# Patient Record
Sex: Female | Born: 1935 | Race: White | Hispanic: No | Marital: Married | Smoking: Former smoker
Health system: Southern US, Community
[De-identification: ages and names within clinical notes are randomized; demographics above are authoritative.]

## PROBLEM LIST (undated history)

## (undated) DIAGNOSIS — F329 Major depressive disorder, single episode, unspecified: Secondary | ICD-10-CM

## (undated) DIAGNOSIS — I4891 Unspecified atrial fibrillation: Secondary | ICD-10-CM

## (undated) DIAGNOSIS — I442 Atrioventricular block, complete: Secondary | ICD-10-CM

## (undated) DIAGNOSIS — Z87440 Personal history of urinary (tract) infections: Secondary | ICD-10-CM

## (undated) DIAGNOSIS — I10 Essential (primary) hypertension: Secondary | ICD-10-CM

## (undated) DIAGNOSIS — F32A Depression, unspecified: Secondary | ICD-10-CM

## (undated) DIAGNOSIS — Z950 Presence of cardiac pacemaker: Secondary | ICD-10-CM

## (undated) DIAGNOSIS — G629 Polyneuropathy, unspecified: Secondary | ICD-10-CM

## (undated) DIAGNOSIS — G479 Sleep disorder, unspecified: Secondary | ICD-10-CM

## (undated) DIAGNOSIS — J40 Bronchitis, not specified as acute or chronic: Secondary | ICD-10-CM

## (undated) DIAGNOSIS — M47816 Spondylosis without myelopathy or radiculopathy, lumbar region: Secondary | ICD-10-CM

## (undated) HISTORY — DX: Atrioventricular block, complete: I44.2

## (undated) HISTORY — PX: APPENDECTOMY: SHX54

## (undated) HISTORY — DX: Polyneuropathy, unspecified: G62.9

## (undated) HISTORY — DX: Bronchitis, not specified as acute or chronic: J40

## (undated) HISTORY — DX: Spondylosis without myelopathy or radiculopathy, lumbar region: M47.816

## (undated) HISTORY — DX: Depression, unspecified: F32.A

## (undated) HISTORY — PX: PULSE GENERATOR IMPLANT: SHX5370

## (undated) HISTORY — DX: Essential (primary) hypertension: I10

## (undated) HISTORY — PX: VENTRICULAR CARDIAC PACEMAKER INSERTION: SHX836

## (undated) HISTORY — PX: BREAST BIOPSY: SHX20

## (undated) HISTORY — PX: VENTRICULAR ABLATION SURGERY: SHX835

## (undated) HISTORY — PX: KNEE SURGERY: SHX244

## (undated) HISTORY — DX: Sleep disorder, unspecified: G47.9

## (undated) HISTORY — DX: Personal history of urinary (tract) infections: Z87.440

## (undated) HISTORY — DX: Unspecified atrial fibrillation: I48.91

## (undated) HISTORY — PX: INSERT / REPLACE / REMOVE PACEMAKER: SUR710

## (undated) HISTORY — DX: Major depressive disorder, single episode, unspecified: F32.9

## (undated) HISTORY — DX: Presence of cardiac pacemaker: Z95.0

---

## 1997-11-03 ENCOUNTER — Ambulatory Visit (HOSPITAL_COMMUNITY): Admission: RE | Admit: 1997-11-03 | Discharge: 1997-11-03 | Payer: Self-pay | Admitting: Obstetrics & Gynecology

## 2003-09-29 ENCOUNTER — Inpatient Hospital Stay (HOSPITAL_COMMUNITY): Admission: EM | Admit: 2003-09-29 | Discharge: 2003-10-04 | Payer: Self-pay | Admitting: Emergency Medicine

## 2003-10-02 ENCOUNTER — Encounter: Payer: Self-pay | Admitting: Cardiology

## 2004-02-02 ENCOUNTER — Inpatient Hospital Stay (HOSPITAL_COMMUNITY): Admission: EM | Admit: 2004-02-02 | Discharge: 2004-02-10 | Payer: Self-pay | Admitting: Emergency Medicine

## 2004-07-19 ENCOUNTER — Ambulatory Visit: Payer: Self-pay | Admitting: Cardiology

## 2004-07-26 ENCOUNTER — Ambulatory Visit: Payer: Self-pay | Admitting: Family Medicine

## 2004-08-02 ENCOUNTER — Ambulatory Visit: Payer: Self-pay

## 2004-08-13 ENCOUNTER — Ambulatory Visit: Payer: Self-pay | Admitting: Cardiology

## 2004-08-29 ENCOUNTER — Ambulatory Visit: Payer: Self-pay

## 2004-08-30 ENCOUNTER — Ambulatory Visit: Payer: Self-pay | Admitting: Cardiology

## 2004-09-20 ENCOUNTER — Ambulatory Visit: Payer: Self-pay | Admitting: Cardiovascular Disease

## 2004-09-28 ENCOUNTER — Ambulatory Visit: Payer: Self-pay | Admitting: Cardiology

## 2004-10-14 ENCOUNTER — Ambulatory Visit: Payer: Self-pay | Admitting: *Deleted

## 2004-11-08 ENCOUNTER — Ambulatory Visit: Payer: Self-pay | Admitting: Cardiology

## 2004-11-22 ENCOUNTER — Ambulatory Visit: Payer: Self-pay | Admitting: Cardiology

## 2004-12-06 ENCOUNTER — Ambulatory Visit: Payer: Self-pay | Admitting: Cardiology

## 2004-12-19 ENCOUNTER — Ambulatory Visit: Payer: Self-pay | Admitting: Cardiology

## 2004-12-31 ENCOUNTER — Ambulatory Visit: Payer: Self-pay | Admitting: *Deleted

## 2005-01-16 ENCOUNTER — Ambulatory Visit: Payer: Self-pay | Admitting: Internal Medicine

## 2005-01-28 ENCOUNTER — Encounter: Admission: RE | Admit: 2005-01-28 | Discharge: 2005-01-28 | Payer: Self-pay | Admitting: Orthopedic Surgery

## 2005-01-29 ENCOUNTER — Ambulatory Visit: Payer: Self-pay | Admitting: Cardiology

## 2005-01-30 ENCOUNTER — Ambulatory Visit: Payer: Self-pay | Admitting: Cardiology

## 2005-02-27 ENCOUNTER — Ambulatory Visit: Payer: Self-pay | Admitting: Cardiology

## 2005-03-19 ENCOUNTER — Ambulatory Visit: Payer: Self-pay | Admitting: Cardiology

## 2005-04-01 ENCOUNTER — Ambulatory Visit: Payer: Self-pay | Admitting: Cardiology

## 2005-04-04 ENCOUNTER — Ambulatory Visit: Payer: Self-pay

## 2005-04-11 ENCOUNTER — Ambulatory Visit: Payer: Self-pay

## 2005-04-15 ENCOUNTER — Inpatient Hospital Stay (HOSPITAL_COMMUNITY): Admission: RE | Admit: 2005-04-15 | Discharge: 2005-04-16 | Payer: Self-pay | Admitting: Cardiology

## 2005-04-16 ENCOUNTER — Ambulatory Visit: Payer: Self-pay | Admitting: Cardiology

## 2005-04-25 ENCOUNTER — Ambulatory Visit: Payer: Self-pay | Admitting: Internal Medicine

## 2005-04-30 ENCOUNTER — Ambulatory Visit: Payer: Self-pay | Admitting: Cardiology

## 2005-04-30 ENCOUNTER — Ambulatory Visit: Payer: Self-pay

## 2005-05-08 ENCOUNTER — Ambulatory Visit: Payer: Self-pay | Admitting: Cardiology

## 2005-05-23 ENCOUNTER — Ambulatory Visit: Payer: Self-pay | Admitting: Cardiology

## 2005-06-23 ENCOUNTER — Ambulatory Visit: Payer: Self-pay | Admitting: Internal Medicine

## 2005-07-03 ENCOUNTER — Ambulatory Visit: Payer: Self-pay | Admitting: Cardiology

## 2005-07-17 ENCOUNTER — Ambulatory Visit: Payer: Self-pay | Admitting: Cardiology

## 2005-08-13 ENCOUNTER — Ambulatory Visit: Payer: Self-pay | Admitting: *Deleted

## 2005-08-20 ENCOUNTER — Ambulatory Visit: Payer: Self-pay | Admitting: Cardiology

## 2005-08-29 ENCOUNTER — Ambulatory Visit: Payer: Self-pay | Admitting: Internal Medicine

## 2005-08-29 ENCOUNTER — Encounter: Admission: RE | Admit: 2005-08-29 | Discharge: 2005-08-29 | Payer: Self-pay | Admitting: Orthopedic Surgery

## 2005-09-05 ENCOUNTER — Encounter: Admission: RE | Admit: 2005-09-05 | Discharge: 2005-09-05 | Payer: Self-pay | Admitting: Orthopaedic Surgery

## 2005-09-10 ENCOUNTER — Ambulatory Visit (HOSPITAL_COMMUNITY): Admission: RE | Admit: 2005-09-10 | Discharge: 2005-09-11 | Payer: Self-pay | Admitting: Orthopaedic Surgery

## 2005-09-10 ENCOUNTER — Encounter (INDEPENDENT_AMBULATORY_CARE_PROVIDER_SITE_OTHER): Payer: Self-pay | Admitting: Specialist

## 2005-09-19 ENCOUNTER — Ambulatory Visit: Payer: Self-pay | Admitting: Internal Medicine

## 2005-10-03 ENCOUNTER — Ambulatory Visit: Payer: Self-pay | Admitting: Cardiology

## 2005-10-16 ENCOUNTER — Ambulatory Visit: Payer: Self-pay | Admitting: *Deleted

## 2005-10-31 ENCOUNTER — Ambulatory Visit: Payer: Self-pay | Admitting: Rheumatology

## 2005-11-10 ENCOUNTER — Inpatient Hospital Stay (HOSPITAL_COMMUNITY): Admission: AD | Admit: 2005-11-10 | Discharge: 2005-11-12 | Payer: Self-pay | Admitting: Orthopaedic Surgery

## 2005-11-11 ENCOUNTER — Encounter (INDEPENDENT_AMBULATORY_CARE_PROVIDER_SITE_OTHER): Payer: Self-pay | Admitting: *Deleted

## 2005-11-18 ENCOUNTER — Ambulatory Visit: Payer: Self-pay | Admitting: *Deleted

## 2005-11-25 ENCOUNTER — Ambulatory Visit: Payer: Self-pay | Admitting: Cardiology

## 2005-12-04 ENCOUNTER — Ambulatory Visit (HOSPITAL_COMMUNITY): Admission: RE | Admit: 2005-12-04 | Discharge: 2005-12-04 | Payer: Self-pay | Admitting: Orthopaedic Surgery

## 2005-12-09 ENCOUNTER — Ambulatory Visit: Payer: Self-pay | Admitting: Cardiology

## 2005-12-19 ENCOUNTER — Ambulatory Visit: Payer: Self-pay | Admitting: Internal Medicine

## 2006-01-20 ENCOUNTER — Ambulatory Visit: Payer: Self-pay | Admitting: Cardiology

## 2006-02-17 ENCOUNTER — Ambulatory Visit (HOSPITAL_BASED_OUTPATIENT_CLINIC_OR_DEPARTMENT_OTHER): Admission: RE | Admit: 2006-02-17 | Discharge: 2006-02-17 | Payer: Self-pay | Admitting: Orthopaedic Surgery

## 2006-02-23 ENCOUNTER — Ambulatory Visit: Payer: Self-pay | Admitting: Cardiology

## 2006-03-03 ENCOUNTER — Ambulatory Visit: Payer: Self-pay | Admitting: Cardiovascular Disease

## 2006-03-17 ENCOUNTER — Encounter: Admission: RE | Admit: 2006-03-17 | Discharge: 2006-03-17 | Payer: Self-pay | Admitting: Orthopaedic Surgery

## 2006-03-17 ENCOUNTER — Ambulatory Visit: Payer: Self-pay | Admitting: Cardiology

## 2006-04-08 ENCOUNTER — Ambulatory Visit: Payer: Self-pay | Admitting: *Deleted

## 2006-05-01 ENCOUNTER — Ambulatory Visit: Payer: Self-pay | Admitting: Cardiology

## 2006-05-01 ENCOUNTER — Ambulatory Visit: Payer: Self-pay | Admitting: Cardiovascular Disease

## 2006-05-29 ENCOUNTER — Ambulatory Visit: Payer: Self-pay | Admitting: Cardiovascular Disease

## 2006-07-02 ENCOUNTER — Ambulatory Visit: Payer: Self-pay | Admitting: Cardiology

## 2006-07-07 ENCOUNTER — Ambulatory Visit: Payer: Self-pay | Admitting: Cardiology

## 2006-07-27 ENCOUNTER — Ambulatory Visit: Payer: Self-pay | Admitting: Cardiology

## 2006-08-10 ENCOUNTER — Ambulatory Visit: Payer: Self-pay | Admitting: Cardiology

## 2006-08-13 ENCOUNTER — Ambulatory Visit: Payer: Self-pay | Admitting: Cardiology

## 2006-08-25 ENCOUNTER — Ambulatory Visit: Payer: Self-pay | Admitting: Cardiology

## 2006-09-07 ENCOUNTER — Encounter: Admission: RE | Admit: 2006-09-07 | Discharge: 2006-09-07 | Payer: Self-pay | Admitting: Orthopaedic Surgery

## 2006-09-07 ENCOUNTER — Ambulatory Visit: Payer: Self-pay | Admitting: Cardiovascular Disease

## 2006-09-17 ENCOUNTER — Ambulatory Visit (HOSPITAL_COMMUNITY): Admission: RE | Admit: 2006-09-17 | Discharge: 2006-09-18 | Payer: Self-pay | Admitting: Orthopaedic Surgery

## 2006-09-25 ENCOUNTER — Ambulatory Visit (HOSPITAL_COMMUNITY): Admission: RE | Admit: 2006-09-25 | Discharge: 2006-09-25 | Payer: Self-pay | Admitting: Orthopaedic Surgery

## 2006-10-02 ENCOUNTER — Ambulatory Visit: Payer: Self-pay | Admitting: Internal Medicine

## 2006-10-08 ENCOUNTER — Ambulatory Visit: Payer: Self-pay | Admitting: Cardiology

## 2006-10-13 ENCOUNTER — Ambulatory Visit: Payer: Self-pay | Admitting: Cardiology

## 2006-10-27 ENCOUNTER — Ambulatory Visit: Payer: Self-pay | Admitting: Cardiology

## 2006-11-06 ENCOUNTER — Ambulatory Visit: Payer: Self-pay | Admitting: Internal Medicine

## 2006-11-18 ENCOUNTER — Ambulatory Visit: Payer: Self-pay | Admitting: Cardiology

## 2006-11-19 ENCOUNTER — Ambulatory Visit: Payer: Self-pay | Admitting: Cardiology

## 2006-12-09 ENCOUNTER — Ambulatory Visit: Payer: Self-pay | Admitting: Cardiology

## 2006-12-31 ENCOUNTER — Ambulatory Visit: Payer: Self-pay | Admitting: Cardiology

## 2006-12-31 LAB — CONVERTED CEMR LAB
BUN: 18 mg/dL (ref 6–23)
Basophils Relative: 0.7 % (ref 0.0–1.0)
Calcium: 9.3 mg/dL (ref 8.4–10.5)
Eosinophils Absolute: 0.1 10*3/uL (ref 0.0–0.6)
GFR calc Af Amer: 77 mL/min
GFR calc non Af Amer: 63 mL/min
Lymphocytes Relative: 23.3 % (ref 12.0–46.0)
MCV: 92.7 fL (ref 78.0–100.0)
Monocytes Relative: 7.7 % (ref 3.0–11.0)
Neutro Abs: 4.2 10*3/uL (ref 1.4–7.7)
Platelets: 193 10*3/uL (ref 150–400)

## 2007-01-12 ENCOUNTER — Ambulatory Visit: Payer: Self-pay | Admitting: Cardiology

## 2007-01-29 ENCOUNTER — Ambulatory Visit: Payer: Self-pay | Admitting: Internal Medicine

## 2007-02-01 ENCOUNTER — Ambulatory Visit: Payer: Self-pay | Admitting: Cardiology

## 2007-03-01 ENCOUNTER — Ambulatory Visit: Payer: Self-pay | Admitting: Cardiovascular Disease

## 2007-03-09 ENCOUNTER — Ambulatory Visit: Payer: Self-pay | Admitting: Cardiology

## 2007-03-29 ENCOUNTER — Ambulatory Visit: Payer: Self-pay | Admitting: Cardiology

## 2007-04-29 ENCOUNTER — Ambulatory Visit: Payer: Self-pay | Admitting: Cardiology

## 2007-05-27 ENCOUNTER — Ambulatory Visit: Payer: Self-pay | Admitting: Cardiology

## 2007-06-10 ENCOUNTER — Ambulatory Visit: Payer: Self-pay | Admitting: Cardiology

## 2007-06-29 ENCOUNTER — Ambulatory Visit: Payer: Self-pay | Admitting: Cardiology

## 2007-07-14 ENCOUNTER — Ambulatory Visit (HOSPITAL_COMMUNITY): Admission: RE | Admit: 2007-07-14 | Discharge: 2007-07-14 | Payer: Self-pay | Admitting: Orthopedic Surgery

## 2007-07-19 ENCOUNTER — Ambulatory Visit: Payer: Self-pay

## 2007-07-30 ENCOUNTER — Ambulatory Visit: Payer: Self-pay

## 2007-08-10 ENCOUNTER — Ambulatory Visit: Payer: Self-pay | Admitting: Cardiology

## 2007-08-10 LAB — CONVERTED CEMR LAB
BUN: 18 mg/dL (ref 6–23)
Basophils Relative: 0.1 % (ref 0.0–1.0)
CO2: 31 meq/L (ref 19–32)
Creatinine, Ser: 0.9 mg/dL (ref 0.4–1.2)
HCT: 38.9 % (ref 36.0–46.0)
Hemoglobin: 13.3 g/dL (ref 12.0–15.0)
Monocytes Absolute: 0.6 10*3/uL (ref 0.2–0.7)
Neutrophils Relative %: 68.7 % (ref 43.0–77.0)
Potassium: 4.3 meq/L (ref 3.5–5.1)
RDW: 14.4 % (ref 11.5–14.6)
Sodium: 141 meq/L (ref 135–145)
TSH: 1.47 microintl units/mL (ref 0.35–5.50)

## 2007-08-17 ENCOUNTER — Ambulatory Visit: Payer: Self-pay

## 2007-08-17 ENCOUNTER — Encounter: Payer: Self-pay | Admitting: Cardiology

## 2007-08-19 ENCOUNTER — Ambulatory Visit: Payer: Self-pay

## 2007-08-24 ENCOUNTER — Ambulatory Visit: Payer: Self-pay | Admitting: Cardiology

## 2007-09-20 ENCOUNTER — Ambulatory Visit: Payer: Self-pay | Admitting: Internal Medicine

## 2007-09-21 ENCOUNTER — Encounter: Payer: Self-pay | Admitting: Rheumatology

## 2007-09-24 ENCOUNTER — Ambulatory Visit: Payer: Self-pay | Admitting: Cardiology

## 2007-10-10 ENCOUNTER — Encounter: Payer: Self-pay | Admitting: Rheumatology

## 2007-10-11 ENCOUNTER — Ambulatory Visit: Payer: Self-pay | Admitting: Cardiology

## 2007-10-28 ENCOUNTER — Ambulatory Visit: Payer: Self-pay | Admitting: Rheumatology

## 2007-10-28 ENCOUNTER — Ambulatory Visit: Payer: Self-pay | Admitting: Cardiology

## 2007-11-23 ENCOUNTER — Ambulatory Visit: Payer: Self-pay | Admitting: Cardiology

## 2007-12-24 ENCOUNTER — Ambulatory Visit: Payer: Self-pay | Admitting: Cardiology

## 2008-02-21 ENCOUNTER — Ambulatory Visit: Payer: Self-pay | Admitting: Internal Medicine

## 2008-02-21 LAB — CONVERTED CEMR LAB
BUN: 20 mg/dL (ref 6–23)
Basophils Relative: 0 % (ref 0.0–1.0)
Calcium: 9.4 mg/dL (ref 8.4–10.5)
Creatinine, Ser: 0.8 mg/dL (ref 0.4–1.2)
GFR calc Af Amer: 88 mL/min
Glucose, Bld: 117 mg/dL — ABNORMAL HIGH (ref 70–99)
HCT: 39.3 % (ref 36.0–46.0)
Hemoglobin: 13.3 g/dL (ref 12.0–15.0)
Monocytes Absolute: 0.4 10*3/uL (ref 0.1–1.0)
Monocytes Relative: 7.6 % (ref 3.0–12.0)
Neutro Abs: 4.3 10*3/uL (ref 1.4–7.7)
RBC: 4.09 M/uL (ref 3.87–5.11)
RDW: 14.3 % (ref 11.5–14.6)
Sodium: 137 meq/L (ref 135–145)
TSH: 1.89 microintl units/mL (ref 0.35–5.50)

## 2008-02-22 ENCOUNTER — Ambulatory Visit: Payer: Self-pay | Admitting: Internal Medicine

## 2008-02-25 ENCOUNTER — Ambulatory Visit: Payer: Self-pay | Admitting: Cardiology

## 2008-02-29 ENCOUNTER — Ambulatory Visit: Payer: Self-pay | Admitting: Internal Medicine

## 2008-02-29 ENCOUNTER — Ambulatory Visit: Payer: Self-pay

## 2008-02-29 ENCOUNTER — Encounter: Payer: Self-pay | Admitting: Internal Medicine

## 2008-02-29 LAB — CONVERTED CEMR LAB
BUN: 18 mg/dL (ref 6–23)
CO2: 28 meq/L (ref 19–32)
Calcium: 8.9 mg/dL (ref 8.4–10.5)
Chloride: 97 meq/L (ref 96–112)
Creatinine, Ser: 0.8 mg/dL (ref 0.4–1.2)
Glucose, Bld: 96 mg/dL (ref 70–99)

## 2008-03-17 ENCOUNTER — Ambulatory Visit: Payer: Self-pay | Admitting: Cardiology

## 2008-03-17 LAB — CONVERTED CEMR LAB
BUN: 21 mg/dL (ref 6–23)
Basophils Absolute: 0 10*3/uL (ref 0.0–0.1)
Basophils Relative: 0.4 % (ref 0.0–1.0)
CO2: 24 meq/L (ref 19–32)
Calcium: 9.2 mg/dL (ref 8.4–10.5)
Chloride: 96 meq/L (ref 96–112)
Creatinine, Ser: 1 mg/dL (ref 0.4–1.2)
Eosinophils Absolute: 0.1 10*3/uL (ref 0.0–0.7)
Eosinophils Relative: 1 % (ref 0.0–5.0)
GFR calc non Af Amer: 56 mL/min
Hemoglobin: 14.3 g/dL (ref 12.0–15.0)
MCHC: 34 g/dL (ref 30.0–36.0)
MCV: 95.3 fL (ref 78.0–100.0)
Neutro Abs: 4.4 10*3/uL (ref 1.4–7.7)
RBC: 4.42 M/uL (ref 3.87–5.11)
WBC: 6.5 10*3/uL (ref 4.5–10.5)

## 2008-03-24 ENCOUNTER — Ambulatory Visit: Payer: Self-pay | Admitting: Cardiology

## 2008-03-28 ENCOUNTER — Ambulatory Visit: Payer: Self-pay | Admitting: Cardiology

## 2008-03-28 LAB — CONVERTED CEMR LAB
CO2: 30 meq/L (ref 19–32)
Calcium: 9.2 mg/dL (ref 8.4–10.5)
Creatinine, Ser: 1.2 mg/dL (ref 0.4–1.2)
GFR calc Af Amer: 55 mL/min
Glucose, Bld: 112 mg/dL — ABNORMAL HIGH (ref 70–99)
Sodium: 132 meq/L — ABNORMAL LOW (ref 135–145)

## 2008-04-25 ENCOUNTER — Ambulatory Visit: Payer: Self-pay | Admitting: Cardiology

## 2008-05-25 ENCOUNTER — Ambulatory Visit: Payer: Self-pay | Admitting: Cardiovascular Disease

## 2008-06-15 ENCOUNTER — Ambulatory Visit: Payer: Self-pay | Admitting: Cardiology

## 2008-06-16 ENCOUNTER — Ambulatory Visit: Payer: Self-pay | Admitting: Cardiology

## 2008-07-03 ENCOUNTER — Ambulatory Visit: Payer: Self-pay | Admitting: Cardiology

## 2008-07-04 ENCOUNTER — Ambulatory Visit: Payer: Self-pay | Admitting: Cardiology

## 2008-07-06 ENCOUNTER — Ambulatory Visit: Payer: Self-pay | Admitting: Family Medicine

## 2008-07-17 ENCOUNTER — Ambulatory Visit: Payer: Self-pay | Admitting: Internal Medicine

## 2008-07-19 ENCOUNTER — Ambulatory Visit: Payer: Self-pay | Admitting: Family Medicine

## 2008-08-07 ENCOUNTER — Ambulatory Visit: Payer: Self-pay | Admitting: Family Medicine

## 2008-08-17 ENCOUNTER — Ambulatory Visit: Payer: Self-pay | Admitting: Cardiovascular Disease

## 2008-09-14 ENCOUNTER — Ambulatory Visit: Payer: Self-pay | Admitting: Internal Medicine

## 2008-09-22 ENCOUNTER — Ambulatory Visit: Payer: Self-pay | Admitting: Cardiology

## 2008-10-09 ENCOUNTER — Encounter (INDEPENDENT_AMBULATORY_CARE_PROVIDER_SITE_OTHER): Payer: Self-pay | Admitting: *Deleted

## 2008-10-16 ENCOUNTER — Ambulatory Visit: Payer: Self-pay | Admitting: Cardiovascular Disease

## 2008-10-25 DIAGNOSIS — I4891 Unspecified atrial fibrillation: Secondary | ICD-10-CM

## 2008-10-25 DIAGNOSIS — I5032 Chronic diastolic (congestive) heart failure: Secondary | ICD-10-CM | POA: Insufficient documentation

## 2008-10-26 ENCOUNTER — Encounter: Payer: Self-pay | Admitting: Cardiology

## 2008-10-26 ENCOUNTER — Ambulatory Visit: Payer: Self-pay | Admitting: Cardiology

## 2008-10-26 LAB — CONVERTED CEMR LAB
Basophils Absolute: 0 10*3/uL (ref 0.0–0.1)
CO2: 28 meq/L (ref 19–32)
Chloride: 101 meq/L (ref 96–112)
Eosinophils Absolute: 0.2 10*3/uL (ref 0.0–0.7)
GFR calc non Af Amer: 56 mL/min
Lymphocytes Relative: 18.9 % (ref 12.0–46.0)
MCHC: 34.1 g/dL (ref 30.0–36.0)
MCV: 94.6 fL (ref 78.0–100.0)
Neutrophils Relative %: 69 % (ref 43.0–77.0)
Platelets: 188 10*3/uL (ref 150–400)
Potassium: 3.9 meq/L (ref 3.5–5.1)
RBC: 4.23 M/uL (ref 3.87–5.11)
Sodium: 137 meq/L (ref 135–145)

## 2008-10-31 ENCOUNTER — Ambulatory Visit: Payer: Self-pay | Admitting: Cardiology

## 2008-11-21 ENCOUNTER — Ambulatory Visit: Payer: Self-pay | Admitting: Cardiology

## 2008-12-18 ENCOUNTER — Ambulatory Visit: Payer: Self-pay | Admitting: Internal Medicine

## 2009-01-01 ENCOUNTER — Ambulatory Visit: Payer: Self-pay | Admitting: Internal Medicine

## 2009-01-04 ENCOUNTER — Ambulatory Visit: Payer: Self-pay | Admitting: Cardiology

## 2009-01-12 ENCOUNTER — Encounter: Payer: Self-pay | Admitting: Cardiology

## 2009-01-15 ENCOUNTER — Ambulatory Visit: Payer: Self-pay | Admitting: Cardiovascular Disease

## 2009-01-16 ENCOUNTER — Telehealth: Payer: Self-pay | Admitting: Cardiology

## 2009-01-16 ENCOUNTER — Encounter: Payer: Self-pay | Admitting: Cardiology

## 2009-01-23 ENCOUNTER — Inpatient Hospital Stay (HOSPITAL_COMMUNITY): Admission: RE | Admit: 2009-01-23 | Discharge: 2009-01-24 | Payer: Self-pay | Admitting: Orthopedic Surgery

## 2009-02-01 ENCOUNTER — Ambulatory Visit: Payer: Self-pay | Admitting: Internal Medicine

## 2009-02-15 ENCOUNTER — Other Ambulatory Visit: Payer: Self-pay | Admitting: Cardiology

## 2009-02-15 ENCOUNTER — Ambulatory Visit: Payer: Self-pay | Admitting: Cardiology

## 2009-02-20 ENCOUNTER — Ambulatory Visit: Payer: Self-pay | Admitting: Cardiology

## 2009-02-20 ENCOUNTER — Telehealth (INDEPENDENT_AMBULATORY_CARE_PROVIDER_SITE_OTHER): Payer: Self-pay | Admitting: Physician Assistant

## 2009-02-20 ENCOUNTER — Other Ambulatory Visit: Payer: Self-pay | Admitting: Cardiology

## 2009-02-28 ENCOUNTER — Ambulatory Visit: Payer: Self-pay | Admitting: Cardiology

## 2009-03-20 ENCOUNTER — Ambulatory Visit: Payer: Self-pay | Admitting: Cardiology

## 2009-04-05 ENCOUNTER — Ambulatory Visit: Payer: Self-pay | Admitting: Cardiology

## 2009-04-11 ENCOUNTER — Ambulatory Visit: Payer: Self-pay | Admitting: Family Medicine

## 2009-04-11 ENCOUNTER — Encounter: Payer: Self-pay | Admitting: Cardiology

## 2009-04-13 ENCOUNTER — Ambulatory Visit: Payer: Self-pay | Admitting: Cardiology

## 2009-04-16 LAB — CONVERTED CEMR LAB
CO2: 31 meq/L (ref 19–32)
Calcium: 8.7 mg/dL (ref 8.4–10.5)
Chloride: 100 meq/L (ref 96–112)
Glucose, Bld: 100 mg/dL — ABNORMAL HIGH (ref 70–99)
Potassium: 3.5 meq/L (ref 3.5–5.1)
Sodium: 140 meq/L (ref 135–145)

## 2009-04-17 ENCOUNTER — Ambulatory Visit: Payer: Self-pay | Admitting: Cardiology

## 2009-04-17 ENCOUNTER — Encounter: Payer: Self-pay | Admitting: Cardiology

## 2009-04-23 ENCOUNTER — Encounter: Payer: Self-pay | Admitting: *Deleted

## 2009-05-02 ENCOUNTER — Ambulatory Visit: Payer: Self-pay | Admitting: Cardiology

## 2009-05-02 LAB — CONVERTED CEMR LAB: POC INR: 2.1

## 2009-05-25 ENCOUNTER — Ambulatory Visit: Payer: Self-pay

## 2009-05-25 ENCOUNTER — Ambulatory Visit: Payer: Self-pay | Admitting: Cardiology

## 2009-05-25 ENCOUNTER — Encounter: Payer: Self-pay | Admitting: Cardiology

## 2009-05-30 ENCOUNTER — Ambulatory Visit: Payer: Self-pay | Admitting: Internal Medicine

## 2009-06-21 ENCOUNTER — Ambulatory Visit: Payer: Self-pay | Admitting: Cardiology

## 2009-06-21 LAB — CONVERTED CEMR LAB: POC INR: 3.5

## 2009-07-04 ENCOUNTER — Ambulatory Visit: Payer: Self-pay | Admitting: Internal Medicine

## 2009-07-04 LAB — CONVERTED CEMR LAB: POC INR: 1.9

## 2009-07-05 ENCOUNTER — Ambulatory Visit: Payer: Self-pay | Admitting: Cardiology

## 2009-07-27 ENCOUNTER — Ambulatory Visit: Payer: Self-pay | Admitting: Cardiovascular Disease

## 2009-07-27 LAB — CONVERTED CEMR LAB: POC INR: 1.8

## 2009-08-22 ENCOUNTER — Ambulatory Visit: Payer: Self-pay | Admitting: Cardiology

## 2009-09-20 ENCOUNTER — Ambulatory Visit: Payer: Self-pay | Admitting: Internal Medicine

## 2009-09-20 LAB — CONVERTED CEMR LAB: POC INR: 4.6

## 2009-09-27 ENCOUNTER — Ambulatory Visit: Payer: Self-pay | Admitting: Internal Medicine

## 2009-10-04 ENCOUNTER — Ambulatory Visit: Payer: Self-pay | Admitting: Cardiology

## 2009-11-05 ENCOUNTER — Ambulatory Visit: Payer: Self-pay | Admitting: Cardiovascular Disease

## 2009-11-21 ENCOUNTER — Ambulatory Visit: Payer: Self-pay | Admitting: Family Medicine

## 2009-11-22 ENCOUNTER — Ambulatory Visit: Payer: Self-pay | Admitting: Family Medicine

## 2009-11-27 ENCOUNTER — Ambulatory Visit: Payer: Self-pay | Admitting: Cardiology

## 2009-12-05 ENCOUNTER — Ambulatory Visit: Payer: Self-pay | Admitting: Cardiovascular Disease

## 2009-12-05 LAB — CONVERTED CEMR LAB: POC INR: 1.8

## 2010-01-01 ENCOUNTER — Ambulatory Visit: Payer: Self-pay | Admitting: Internal Medicine

## 2010-01-02 ENCOUNTER — Ambulatory Visit: Payer: Self-pay | Admitting: Cardiology

## 2010-01-03 ENCOUNTER — Encounter: Payer: Self-pay | Admitting: Cardiology

## 2010-01-08 ENCOUNTER — Telehealth: Payer: Self-pay | Admitting: Cardiology

## 2010-01-14 ENCOUNTER — Inpatient Hospital Stay: Payer: Self-pay | Admitting: Internal Medicine

## 2010-01-14 ENCOUNTER — Ambulatory Visit: Payer: Self-pay | Admitting: Cardiovascular Disease

## 2010-01-14 LAB — CONVERTED CEMR LAB: POC INR: 3.7

## 2010-02-12 ENCOUNTER — Ambulatory Visit: Payer: Self-pay | Admitting: Family Medicine

## 2010-02-26 ENCOUNTER — Ambulatory Visit: Payer: Self-pay | Admitting: Cardiovascular Disease

## 2010-02-26 LAB — CONVERTED CEMR LAB: POC INR: 2.5

## 2010-03-20 ENCOUNTER — Ambulatory Visit: Payer: Self-pay | Admitting: Internal Medicine

## 2010-03-20 LAB — CONVERTED CEMR LAB: POC INR: 2.8

## 2010-03-26 ENCOUNTER — Telehealth: Payer: Self-pay | Admitting: Cardiology

## 2010-04-04 ENCOUNTER — Ambulatory Visit: Payer: Self-pay | Admitting: Cardiology

## 2010-04-09 ENCOUNTER — Encounter: Payer: Self-pay | Admitting: Physician Assistant

## 2010-04-09 ENCOUNTER — Ambulatory Visit: Payer: Self-pay | Admitting: Cardiology

## 2010-04-17 ENCOUNTER — Ambulatory Visit: Payer: Self-pay | Admitting: Cardiovascular Disease

## 2010-05-07 ENCOUNTER — Encounter: Payer: Self-pay | Admitting: Cardiology

## 2010-05-08 ENCOUNTER — Ambulatory Visit: Payer: Self-pay | Admitting: Cardiology

## 2010-05-08 LAB — CONVERTED CEMR LAB: POC INR: 2.6

## 2010-05-28 ENCOUNTER — Ambulatory Visit: Payer: Self-pay | Admitting: General Surgery

## 2010-05-29 ENCOUNTER — Encounter: Payer: Self-pay | Admitting: Physician Assistant

## 2010-05-29 ENCOUNTER — Ambulatory Visit: Payer: Self-pay | Admitting: Cardiology

## 2010-06-05 ENCOUNTER — Ambulatory Visit: Payer: Self-pay | Admitting: Cardiology

## 2010-06-25 ENCOUNTER — Ambulatory Visit: Payer: Self-pay | Admitting: Internal Medicine

## 2010-06-25 DIAGNOSIS — I10 Essential (primary) hypertension: Secondary | ICD-10-CM | POA: Insufficient documentation

## 2010-06-25 DIAGNOSIS — G479 Sleep disorder, unspecified: Secondary | ICD-10-CM | POA: Insufficient documentation

## 2010-06-25 DIAGNOSIS — M810 Age-related osteoporosis without current pathological fracture: Secondary | ICD-10-CM | POA: Insufficient documentation

## 2010-06-25 DIAGNOSIS — M1990 Unspecified osteoarthritis, unspecified site: Secondary | ICD-10-CM | POA: Insufficient documentation

## 2010-06-25 LAB — CONVERTED CEMR LAB
Ketones, urine, test strip: NEGATIVE
Nitrite: POSITIVE
Urobilinogen, UA: 0.2

## 2010-06-26 ENCOUNTER — Telehealth: Payer: Self-pay | Admitting: Internal Medicine

## 2010-06-26 LAB — CONVERTED CEMR LAB
AST: 21 units/L (ref 0–37)
Albumin: 4.2 g/dL (ref 3.5–5.2)
BUN: 33 mg/dL — ABNORMAL HIGH (ref 6–23)
Basophils Absolute: 0 10*3/uL (ref 0.0–0.1)
CO2: 32 meq/L (ref 19–32)
Chloride: 101 meq/L (ref 96–112)
Eosinophils Absolute: 0.2 10*3/uL (ref 0.0–0.7)
HCT: 38.8 % (ref 36.0–46.0)
Hemoglobin: 13.1 g/dL (ref 12.0–15.0)
Lymphs Abs: 1.5 10*3/uL (ref 0.7–4.0)
MCHC: 33.6 g/dL (ref 30.0–36.0)
MCV: 94.7 fL (ref 78.0–100.0)
Monocytes Absolute: 0.6 10*3/uL (ref 0.1–1.0)
Neutro Abs: 4.3 10*3/uL (ref 1.4–7.7)
RDW: 14.6 % (ref 11.5–14.6)
TSH: 1.16 microintl units/mL (ref 0.35–5.50)

## 2010-06-27 ENCOUNTER — Telehealth: Payer: Self-pay | Admitting: Internal Medicine

## 2010-07-03 ENCOUNTER — Ambulatory Visit: Payer: Self-pay | Admitting: Cardiovascular Disease

## 2010-07-03 LAB — CONVERTED CEMR LAB: POC INR: 3.7

## 2010-07-04 ENCOUNTER — Encounter: Payer: Self-pay | Admitting: Internal Medicine

## 2010-07-04 ENCOUNTER — Ambulatory Visit: Payer: Self-pay | Admitting: Cardiology

## 2010-07-05 ENCOUNTER — Encounter (INDEPENDENT_AMBULATORY_CARE_PROVIDER_SITE_OTHER): Payer: Self-pay | Admitting: *Deleted

## 2010-07-16 ENCOUNTER — Ambulatory Visit: Payer: Self-pay | Admitting: Internal Medicine

## 2010-07-16 DIAGNOSIS — G589 Mononeuropathy, unspecified: Secondary | ICD-10-CM | POA: Insufficient documentation

## 2010-07-16 LAB — CONVERTED CEMR LAB
Bilirubin Urine: NEGATIVE
Ketones, urine, test strip: NEGATIVE
Nitrite: POSITIVE
Specific Gravity, Urine: <1.005

## 2010-07-17 ENCOUNTER — Ambulatory Visit: Payer: Self-pay | Admitting: Cardiology

## 2010-07-17 LAB — CONVERTED CEMR LAB: POC INR: 2.5

## 2010-07-18 ENCOUNTER — Telehealth: Payer: Self-pay | Admitting: Internal Medicine

## 2010-08-12 ENCOUNTER — Ambulatory Visit: Payer: Self-pay | Admitting: Cardiovascular Disease

## 2010-08-14 ENCOUNTER — Ambulatory Visit: Payer: Self-pay

## 2010-08-16 ENCOUNTER — Ambulatory Visit: Payer: Self-pay | Admitting: Internal Medicine

## 2010-09-11 ENCOUNTER — Ambulatory Visit: Admit: 2010-09-11 | Payer: Self-pay

## 2010-09-12 ENCOUNTER — Ambulatory Visit
Admission: RE | Admit: 2010-09-12 | Discharge: 2010-09-12 | Payer: Self-pay | Source: Home / Self Care | Attending: Cardiovascular Disease | Admitting: Cardiovascular Disease

## 2010-09-19 ENCOUNTER — Telehealth: Payer: Self-pay | Admitting: Internal Medicine

## 2010-09-20 ENCOUNTER — Ambulatory Visit
Admission: RE | Admit: 2010-09-20 | Discharge: 2010-09-20 | Payer: Self-pay | Source: Home / Self Care | Attending: Internal Medicine | Admitting: Internal Medicine

## 2010-09-20 DIAGNOSIS — M109 Gout, unspecified: Secondary | ICD-10-CM | POA: Insufficient documentation

## 2010-09-20 DIAGNOSIS — I872 Venous insufficiency (chronic) (peripheral): Secondary | ICD-10-CM | POA: Insufficient documentation

## 2010-09-23 ENCOUNTER — Other Ambulatory Visit: Payer: Self-pay | Admitting: Internal Medicine

## 2010-09-23 ENCOUNTER — Ambulatory Visit
Admission: RE | Admit: 2010-09-23 | Discharge: 2010-09-23 | Payer: Self-pay | Source: Home / Self Care | Attending: Internal Medicine | Admitting: Internal Medicine

## 2010-09-23 ENCOUNTER — Telehealth: Payer: Self-pay | Admitting: Internal Medicine

## 2010-09-23 ENCOUNTER — Encounter: Payer: Self-pay | Admitting: Internal Medicine

## 2010-09-23 ENCOUNTER — Ambulatory Visit: Payer: Self-pay | Admitting: Internal Medicine

## 2010-09-23 LAB — CBC WITH DIFFERENTIAL/PLATELET
Basophils Absolute: 0 10*3/uL (ref 0.0–0.1)
Basophils Relative: 0.2 % (ref 0.0–3.0)
Eosinophils Absolute: 0.1 10*3/uL (ref 0.0–0.7)
Eosinophils Relative: 1.5 % (ref 0.0–5.0)
HCT: 39.8 % (ref 36.0–46.0)
Hemoglobin: 13.2 g/dL (ref 12.0–15.0)
Lymphocytes Relative: 16.1 % (ref 12.0–46.0)
Lymphs Abs: 1.1 10*3/uL (ref 0.7–4.0)
MCHC: 33.2 g/dL (ref 30.0–36.0)
MCV: 95.4 fl (ref 78.0–100.0)
Monocytes Absolute: 0.6 10*3/uL (ref 0.1–1.0)
Monocytes Relative: 9.3 % (ref 3.0–12.0)
Neutro Abs: 5 10*3/uL (ref 1.4–7.7)
Neutrophils Relative %: 72.9 % (ref 43.0–77.0)
Platelets: 214 10*3/uL (ref 150.0–400.0)
RBC: 4.17 Mil/uL (ref 3.87–5.11)
RDW: 14.4 % (ref 11.5–14.6)
WBC: 6.8 10*3/uL (ref 4.5–10.5)

## 2010-09-23 LAB — RENAL FUNCTION PANEL
Albumin: 4.1 g/dL (ref 3.5–5.2)
BUN: 31 mg/dL — ABNORMAL HIGH (ref 6–23)
CO2: 28 mEq/L (ref 19–32)
Calcium: 9 mg/dL (ref 8.4–10.5)
Chloride: 95 mEq/L — ABNORMAL LOW (ref 96–112)
Creatinine, Ser: 1.1 mg/dL (ref 0.4–1.2)
GFR: 48.78 mL/min — ABNORMAL LOW (ref >60.00)
Glucose, Bld: 109 mg/dL — ABNORMAL HIGH (ref 70–99)
Phosphorus: 3.9 mg/dL (ref 2.3–4.6)
Potassium: 4.2 mEq/L (ref 3.5–5.1)
Sodium: 135 mEq/L (ref 135–145)

## 2010-09-23 LAB — URIC ACID: Uric Acid, Serum: 7.7 mg/dL — ABNORMAL HIGH (ref 2.4–7.0)

## 2010-09-25 ENCOUNTER — Emergency Department: Payer: Self-pay | Admitting: Emergency Medicine

## 2010-09-25 ENCOUNTER — Encounter: Payer: Self-pay | Admitting: Internal Medicine

## 2010-09-27 ENCOUNTER — Ambulatory Visit
Admission: RE | Admit: 2010-09-27 | Discharge: 2010-09-27 | Payer: Self-pay | Source: Home / Self Care | Attending: Internal Medicine | Admitting: Internal Medicine

## 2010-09-28 ENCOUNTER — Emergency Department: Payer: Self-pay | Admitting: Emergency Medicine

## 2010-09-30 ENCOUNTER — Telehealth: Payer: Self-pay | Admitting: Internal Medicine

## 2010-09-30 DIAGNOSIS — I10 Essential (primary) hypertension: Secondary | ICD-10-CM

## 2010-09-30 DIAGNOSIS — S20219A Contusion of unspecified front wall of thorax, initial encounter: Secondary | ICD-10-CM

## 2010-09-30 DIAGNOSIS — K5909 Other constipation: Secondary | ICD-10-CM

## 2010-09-30 DIAGNOSIS — I4891 Unspecified atrial fibrillation: Secondary | ICD-10-CM

## 2010-10-06 LAB — CONVERTED CEMR LAB
CO2: 31 meq/L (ref 19–32)
Calcium: 9.2 mg/dL (ref 8.4–10.5)
Chloride: 103 meq/L (ref 96–112)
Potassium: 3.9 meq/L (ref 3.5–5.1)
Sodium: 141 meq/L (ref 135–145)

## 2010-10-09 ENCOUNTER — Ambulatory Visit: Admit: 2010-10-09 | Payer: Self-pay | Admitting: Cardiovascular Disease

## 2010-10-09 DIAGNOSIS — I509 Heart failure, unspecified: Secondary | ICD-10-CM

## 2010-10-10 NOTE — Medication Information (Signed)
Summary: CCR/AMD  Anticoagulant Therapy  Managed by: Charlena Cross, RN, BSN Referring MD: Charlies Constable MD PCP: Early Chars MD: Bensimhon MD, Daniel Indication 1: Atrial Fibrillation (ICD-427.31) Indication 2: HTN (ICD-401.1) Lab Used: [facility] Site: [place] INR RANGE 2.0-3.0  Dietary changes: no    Health status changes: no    Bleeding/hemorrhagic complications: no    Recent/future hospitalizations: no    Any changes in medication regimen? no    Recent/future dental: no  Any missed doses?: no       Is patient compliant with meds? yes       Allergies: 1)  Demerol (Meperidine Hcl) 2)  Sulfamethoxazole (Sulfamethoxazole)  Anticoagulation Management History:      The patient is taking warfarin and comes in today for a routine follow up visit.  Positive risk factors for bleeding include an age of 16 years or older.  The bleeding index is 'intermediate risk'.  Positive CHADS2 values include History of CHF and Age > 71 years old.  The start date was 12/26/1997.  Her last INR was 2.1 RATIO and today's INR is 1.8.  Anticoagulation responsible provider: Bensimhon MD, Reuel Boom.    Anticoagulation Management Assessment/Plan:      The patient's current anticoagulation dose is Warfarin sodium 2 mg tabs: Use as directed by Anticoagualtion Clinic.  The target INR is 2 - 3.  The next INR is due 01/15/2010.  Anticoagulation instructions were given to patient.  Results were reviewed/authorized by Charlena Cross, RN, BSN.  She was notified by Charlena Cross, RN, BSN.         Prior Anticoagulation Instructions: coumadin 2 mg today then coumadin 2 mg daily with 1mg  on M W F   Current Anticoagulation Instructions: coumadin 4 mg today then coumadin 2 mg daily with 1 mg on M and F

## 2010-10-10 NOTE — Assessment & Plan Note (Signed)
Summary: NEW PT TO [facility]   Vital Signs:  Patient profile:   77 year old female Height:      62 inches Weight:      180 pounds Temp:     98.2 degrees F oral Pulse rate:   70 / minute Pulse rhythm:   regular BP sitting:   120 / 70  (left arm) Cuff size:   large  Vitals Entered By: Mervin Hack CMA Duncan Dull) (June 25, 2010 11:31 AM) CC: new patient to establish care   History of Present Illness: Transfering care  Here with daughter, Miguel Aschoff to have care at [facility] I cared for her husband  She is working throught the process of grieving Has social networks  Longstanding HTN well controlled on current Rx  Dr Juanda Chance follows her for some time has to be very careful with diet---very salt sensitive she monitors weight and increases lasix as needed   Longstanding atrial fib ablation apparently unsuccessful at eliminating no tachycardia though still on coumadin--gets protimes at cardiology office  using the xanax since her husband's death uses only 1 per day around 8PM uses 2 tylenol PM also but still doesn't sleep chronic sleep problem Feels that arthritis pain is a major portion of this  Osteoporosis noted has gotten 3 yearly doses of reclast from Dr Lavenia Atlas  Preventive Screening-Counseling & Management  Alcohol-Tobacco     Smoking Status: quit  Allergies: 1)  Demerol (Meperidine Hcl) 2)  Sulfamethoxazole (Sulfamethoxazole)  Past History:  Past Medical History: 1. Chronic atrial fibrillation status post atrioventricular nodal     ablation. 2. Status post St. Jude Victory Pacemaker programmed to ventricular     demand pacing. 3. Diastolic heart failure with mild volume overload and increased     symptoms of shortness of breath. 4. Lumbar spine disease. 5. Degenerative joint disease.  6. HTN 7. Recurrent UTIs-------  Dr Artis Flock 8. Chronic sleep disturbance 9. Osteoporosis  Past Surgical History: Atrioventricular nodal     ablation.  St. Jude Victory Pacemaker programmed to ventricular   demand pacing.  Right knee surgery Placement of internal pulse generator for permanent spinal cord   stimulator left hip. Hysterectomy Appendectomy Breast biopsy  Family History:   Positive in that she has a mother who died of heart failure and a sister who has heart failure.  She also has a father who died of a stroke. 1 sister alive 24 others deceased Cancer in 2 (breast and pancreatic), 3 in accidents, strokes and vascular disease but they were all of advanced age  Social History: Was homemaker Widow/Widower 1 daughter in [place], son in [place] Former Smoker--quit long ago Alcohol use-yes  Son Rosanne Ashing hold health care power of attorney. Does request DNR. No tube feeds if cognitively unaware Smoking Status:  quit  Review of Systems General:  Complains of sleep disorder; weight stable wears seat belt--drives still. Eyes:  Denies double vision and vision loss-1 eye. ENT:  Denies decreased hearing and ringing in ears; partial upper denture--- regular with dentist. CV:  Complains of difficulty breathing at night, difficulty breathing while lying down, and shortness of breath with exertion; denies chest pain or discomfort and palpitations; stable DOE PND if holding onto fluid. Resp:  Complains of cough; denies shortness of breath; has had occ cough since pneumonia in May follow up CXR was okay. GI:  Denies abdominal pain, bloody stools, change in bowel habits, dark tarry stools, indigestion, nausea, and vomiting. GU:  Complains of dysuria; denies  incontinence; dysuria with recent UTI cipro then macrobid. MS:  Complains of joint pain; denies joint swelling. Derm:  Denies lesion(s) and rash. Neuro:  Denies headaches, numbness, tingling, and weakness. Psych:  Complains of anxiety and depression; mild mood issues mostly from grieving --mostly better now. Heme:  Denies abnormal bruising and enlarge lymph nodes. Allergy:   Denies seasonal allergies and sneezing.  Physical Exam  General:  alert and normal appearance.   Eyes:  pupils equal, pupils round, and pupils reactive to light.   Mouth:  no erythema, no exudates, and no lesions.   Neck:  supple, no masses, no thyromegaly, no carotid bruits, and no cervical lymphadenopathy.   Lungs:  normal respiratory effort, no intercostal retractions, no accessory muscle use, and normal breath sounds.   Heart:  normal rate, regular rhythm, no murmur, and no gallop.   Abdomen:  soft, non-tender, and no masses.   Extremities:  1+ edema to distal calves from feet Neurologic:  alert & oriented X3, strength normal in all extremities, and gait normal with cane Skin:  no rashes and no suspicious lesions.   Axillary Nodes:  No palpable lymphadenopathy Psych:  normally interactive, good eye contact, not anxious appearing, and not depressed appearing.     Impression & Recommendations:  Problem # 1:  DEGENERATIVE JOINT DISEASE (ICD-715.90) Assessment Comment Only ongoing issues mostly at night will try tramadol  Her updated medication list for this problem includes:    Tramadol Hcl 50 Mg Tabs (Tramadol hcl) .Marland Kitchen... 1-2 tabs at bedtime to reduce pain and help sleep  Problem # 2:  ESSENTIAL HYPERTENSION (ICD-401.9) Assessment: Unchanged  good control will check routine labs  Her updated medication list for this problem includes:    Furosemide 80 Mg Tabs (Furosemide) .Marland Kitchen... 1/2 tablet two times a day    Lotensin 40 Mg Tabs (Benazepril hcl) .Marland Kitchen... Take 1 tablet by mouth once a day    Metoprolol Tartrate 25 Mg Tabs (Metoprolol tartrate) .Marland Kitchen... Take one tablet by mouth daily  BP today: 120/70 Prior BP: 118/76 (05/29/2010)  Labs Reviewed: K+: 3.9 (05/25/2009) Creat: : 1.2 (05/25/2009)     Orders: TLB-Renal Function Panel (80069-RENAL) TLB-CBC Platelet - w/Differential (85025-CBCD) TLB-Hepatic/Liver Function Pnl (80076-HEPATIC) TLB-TSH (Thyroid Stimulating Hormone)  (84443-TSH) Venipuncture (16109)  Problem # 3:  SLEEP DISORDER, CHRONIC (ICD-780.50) Assessment: Comment Only stop xanax and benedryl use tylenol and tramadol at night consider trazodone if still having problems  Problem # 4:  DIASTOLIC HEART FAILURE, CHRONIC (ICD-428.32) Assessment: Comment Only stable NYHA class 2 symptoms she adjusts the diuretic based on weight and edema  Her updated medication list for this problem includes:    Furosemide 80 Mg Tabs (Furosemide) .Marland Kitchen... 1/2 tablet two times a day    Warfarin Sodium 2 Mg Tabs (Warfarin sodium) ..... Use as directed by anticoagualtion clinic    Lotensin 40 Mg Tabs (Benazepril hcl) .Marland Kitchen... Take 1 tablet by mouth once a day    Metoprolol Tartrate 25 Mg Tabs (Metoprolol tartrate) .Marland Kitchen... Take one tablet by mouth daily  Problem # 5:  ATRIAL FIBRILLATION (ICD-427.31) Assessment: Comment Only paced rhythm on coumadin  Her updated medication list for this problem includes:    Warfarin Sodium 2 Mg Tabs (Warfarin sodium) ..... Use as directed by anticoagualtion clinic    Metoprolol Tartrate 25 Mg Tabs (Metoprolol tartrate) .Marland Kitchen... Take one tablet by mouth daily  Complete Medication List: 1)  Furosemide 80 Mg Tabs (Furosemide) .... 1/2 tablet two times a day 2)  Warfarin Sodium 2  Mg Tabs (Warfarin sodium) .... Use as directed by anticoagualtion clinic 3)  Lotensin 40 Mg Tabs (Benazepril hcl) .... Take 1 tablet by mouth once a day 4)  Nitroglycerin 0.4 Mg Subl (Nitroglycerin) .... Place 1 tablet under tongue as directed 5)  Metoprolol Tartrate 25 Mg Tabs (Metoprolol tartrate) .... Take one tablet by mouth daily 6)  Potassium Chloride Crys Cr 20 Meq Cr-tabs (Potassium chloride crys cr) .... Take one tablet by mouth daily 7)  Vitamin D 1000 Unit Tabs (Cholecalciferol) .... Take 1 tablet by mouth once a day 8)  Vitamin B-12 1000 Mcg Tabs (Cyanocobalamin) .... Take 1 by mouth once daily 9)  Tramadol Hcl 50 Mg Tabs (Tramadol hcl) .Marland Kitchen.. 1-2 tabs at  bedtime to reduce pain and help sleep  Patient Instructions: 1)  Please stop the xanax 2)  Please change to plain tylenol 2 at bedtime (no PM). Try the pain pills as well. Call if any problems with the meds or still not sleeping well in a few weeks 3)  Please schedule a follow-up appointment in 3 months .  Prescriptions: TRAMADOL HCL 50 MG TABS (TRAMADOL HCL) 1-2 tabs at bedtime to reduce pain and help sleep  #60 x 1   Entered and Authorized by:   Cindee Salt MD   Signed by:   Cindee Salt MD on 06/25/2010   Method used:   Electronically to        [facility]* (retail)       [address]       [place], [place]  [address]       Ph: [phone]       Fax: [phone]   RxID:   [id number] ALPRAZOLAM 0.25 MG TABS (ALPRAZOLAM) take 1 by mouth once daily  #90 x 0   Entered by:   Mervin Hack CMA (AAMA)   Authorized by:   Cindee Salt MD   Signed by:   Mervin Hack CMA (AAMA) on 06/25/2010   Method used:   Print then Give to Patient   RxID:   [id number]    Orders Added: 1)  New Patient Level IV [06301] 2)  TLB-Renal Function Panel [80069-RENAL] 3)  TLB-CBC Platelet - w/Differential [85025-CBCD] 4)  TLB-Hepatic/Liver Function Pnl [80076-HEPATIC] 5)  TLB-TSH (Thyroid Stimulating Hormone) [84443-TSH] 6)  Venipuncture [60109]   Immunization History:  Tetanus/Td Immunization History:    Tetanus/Td:  Historical (09/08/2001)  Influenza Immunization History:    Influenza:  Historical (05/09/2010)  Pneumovax Immunization History:    Pneumovax:  Historical (09/09/2007)   Immunization History:  Influenza Immunization History:    Influenza:  Historical (05/09/2010)  Pneumovax Immunization History:    Pneumovax:  Historical (09/09/2007)  Tetanus/Td Immunization History:    Tetanus/Td:  Historical (09/08/2001)  Current Allergies (reviewed today): DEMEROL (MEPERIDINE HCL) SULFAMETHOXAZOLE  (SULFAMETHOXAZOLE)   Laboratory Results   Urine Tests  Date/Time Received: June 25, 2010 11:45 AM Date/Time Reported: June 25, 2010 11:45 AM  Routine Urinalysis   Color: yellow Appearance: Hazy Glucose: negative   (Normal Range: Negative) Bilirubin: negative   (Normal Range: Negative) Ketone: negative   (Normal Range: Negative) Spec. Gravity: 1.015   (Normal Range: 1.003-1.035) Blood: small   (Normal Range: Negative) Protein: negative   (Normal Range: Negative) Urobilinogen: 0.2   (Normal Range: 0-1) Nitrite: positive   (Normal Range: Negative) Leukocyte Esterace: small   (Normal Range: Negative)

## 2010-10-10 NOTE — Progress Notes (Signed)
Summary: started z-pack/prednisone  Phone Note Call from Patient   Caller: Patient Summary of Call: The pt called today stating she started on a z-pack last night and prednisone today. I discussed this with Ricarda Frame D. The pt needs to have an INR check in one week. The pt is agreeable. Initial call taken by: Sherri Rad, RN, BSN,  Jan 08, 2010 4:16 PM

## 2010-10-10 NOTE — Medication Information (Signed)
Summary: rov/ewj  Anticoagulant Therapy  Managed by: Weston Brass, PharmD Referring MD: Charlies Constable MD PCP: Early Chars MD: Shirlee Latch MD, Dalton Indication 1: Atrial Fibrillation (ICD-427.31) Indication 2: HTN (ICD-401.1) Lab Used: [facility] Site: [place] INR POC 2.5 INR RANGE 2.0-3.0  Dietary changes: no    Health status changes: no    Bleeding/hemorrhagic complications: no    Recent/future hospitalizations: no    Any changes in medication regimen? no    Recent/future dental: no  Any missed doses?: no       Is patient compliant with meds? yes       Allergies: 1)  ! Tramadol Hcl (Tramadol Hcl) 2)  Demerol (Meperidine Hcl) 3)  Sulfamethoxazole (Sulfamethoxazole)  Anticoagulation Management History:      The patient is taking warfarin and comes in today for a routine follow up visit.  Positive risk factors for bleeding include an age of 75 years or older.  The bleeding index is 'intermediate risk'.  Positive CHADS2 values include History of CHF, History of HTN, and Age > 54 years old.  The start date was 12/26/1997.  Her last INR was 1.8.  Anticoagulation responsible provider: Shirlee Latch MD, [place].  INR POC: 2.5.  Cuvette Lot#: [id number].  Exp: 06/2011.    Anticoagulation Management Assessment/Plan:      The patient's current anticoagulation dose is Warfarin sodium 2 mg tabs: Use as directed by Anticoagualtion Clinic.  The target INR is 2 - 3.  The next INR is due 08/14/2010.  Anticoagulation instructions were given to patient.  Results were reviewed/authorized by Weston Brass, PharmD.  She was notified by Hoy Register, PharmD Candidate.         Prior Anticoagulation Instructions: INR 3.7  Skip tomorrow's dosage of Coumadin, then start taking 2mg  daily except 1mg  on Mondays.  Recheck in 2 weeks.    Current Anticoagulation Instructions: INR 2.5 Continue previous dose of 2 tablets everyday. Recheck INR in 4 weeks.

## 2010-10-10 NOTE — Assessment & Plan Note (Signed)
Summary: EC6/AMD   Visit Type:  Initial Consult Primary Provider:  Dr. Alphonsus Sias  CC:  establish care. Denies SOB, chest pain, and or palpitations..  History of Present Illness: This is an 77 year old white female patient with a h/o diastolic heart failure,  pneumonia 01/2010,mild pulmonary hypertension,  long history of back problems requiring surgery,  chronic atrial fibrillation with AV node ablation, placement of pacemaker, who presents to establish care in the [facility] office as her longtime cardiologist, Dr. Juanda Chance, is retiring.  Overall she is doing quite well without complaints of dyspnea, dyspnea on exertion, chest pain, palpitations, dizziness, or presyncope. She is watching her fluid status closely, takes an extra Lasix if she needs it, and watches her salt intake closely. Her weight has been very stable.  She uses a walker at [facility], a walker when she is doing errands. She had pain in her feet recently was started on Neurontin which helped her foot discomfort.  EKG shows ventricularly paced rhythm with rate 70 beats per minute.   Current Medications (verified): 1)  Trazodone Hcl 50 Mg Tabs (Trazodone Hcl) .... Start With 1/2 Tab For 4 Days and If Still Not Sleeping Great, Increase To A Full Tab 2)  Furosemide 80 Mg Tabs (Furosemide) .... 1/2 Tablet Two Times A Day 3)  Warfarin Sodium 2 Mg Tabs (Warfarin Sodium) .... Use As Directed By Anticoagualtion Clinic 4)  Lotensin 40 Mg Tabs (Benazepril Hcl) .... Take 1 Tablet By Mouth Once A Day 5)  Metoprolol Tartrate 25 Mg Tabs (Metoprolol Tartrate) .... Take One Tablet By Mouth Daily 6)  Potassium Chloride Crys Cr 20 Meq Cr-Tabs (Potassium Chloride Crys Cr) .... Take One Tablet By Mouth Daily 7)  Nitroglycerin 0.4 Mg Subl (Nitroglycerin) .... Place 1 Tablet Under Tongue As Directed 8)  Vitamin D 1000 Unit Tabs (Cholecalciferol) .... Take 1 Tablet By Mouth Once A Day 9)  Vitamin B-12 1000 Mcg Tabs (Cyanocobalamin) .... Take 1 By  Mouth Once Daily 10)  Gabapentin 100 Mg Caps (Gabapentin) .Marland Kitchen.. 1 At Bedtime For Foot Pain and Increase As Directed 11)  Cefuroxime Axetil 250 Mg Tabs (Cefuroxime Axetil) .Marland Kitchen.. 1 Tab By Mouth Two Times A Day For Bladder Infection  Allergies (verified): 1)  ! Tramadol Hcl (Tramadol Hcl) 2)  Demerol (Meperidine Hcl) 3)  Sulfamethoxazole (Sulfamethoxazole)  Past History:  Past Medical History: Last updated: 07/16/2010 1. Chronic atrial fibrillation status post atrioventricular nodal     ablation. 2. Status post St. Jude Victory Pacemaker programmed to ventricular     demand pacing. 3. Diastolic heart failure with mild volume overload and increased     symptoms of shortness of breath. 4. Lumbar spine disease. 5. Degenerative joint disease.  6. HTN 7. Recurrent UTIs-------  Dr Artis Flock 8. Chronic sleep disturbance 9. Osteoporosis 10. Neuropathy  Past Surgical History: Last updated: 06/25/2010 Atrioventricular nodal    ablation.  St. Jude Victory Pacemaker programmed to ventricular   demand pacing.  Right knee surgery Placement of internal pulse generator for permanent spinal cord   stimulator left hip. Hysterectomy Appendectomy Breast biopsy  Family History: Last updated: 06/25/2010   Positive in that she has a mother who died of heart failure and a sister who has heart failure.  She also has a father who died of a stroke. 1 sister alive 9 others deceased Cancer in 2 (breast and pancreatic), 3 in accidents, strokes and vascular disease but they were all of advanced age  Social History: Last updated: 06/25/2010 Was homemaker Widow/Widower  1 daughter in [place], son in [place] Former Smoker--quit long ago Alcohol use-yes  Son Rosanne Ashing hold health care power of attorney. Does request DNR. No tube feeds if cognitively unaware  Risk Factors: Smoking Status: quit (06/25/2010)  Review of Systems       The patient complains of difficulty walking.  The patient denies fever, weight  loss, weight gain, vision loss, decreased hearing, hoarseness, chest pain, syncope, dyspnea on exertion, peripheral edema, prolonged cough, abdominal pain, incontinence, muscle weakness, depression, and enlarged lymph nodes.    Vital Signs:  Patient profile:   77 year old female Height:      62 inches Weight:      179.25 pounds BMI:     32.90 Pulse rate:   70 / minute BP sitting:   144 / 86  (left arm) Cuff size:   regular  Vitals Entered By: Lysbeth Galas CMA (August 12, 2010 2:40 PM)  Physical Exam  General:  alert and normal appearance.  elderly woman, walking with a cane Head:  normocephalic and atraumatic Neck:  Neck supple, no JVD. No masses, thyromegaly or abnormal cervical nodes. Lungs:  Clear bilaterally to auscultation and percussion. Heart:  Non-displaced PMI, chest non-tender; regular rate and rhythm, S1, S2 without murmurs, rubs or gallops. Carotid upstroke normal, no bruit.  Pedals normal pulses. No edema, no varicosities. Abdomen:  soft, non-tender, and no masses.   Msk:  Back normal, normal gait. Muscle strength and tone normal. Pulses:  pulses normal in all 4 extremities Extremities:  No clubbing or cyanosis. Neurologic:  Alert and oriented x 3. Skin:  Intact without lesions or rashes. Psych:  Normal affect.   PPM Specifications Following MD:  Everardo Beals. Juanda Chance, MD     PPM Vendor:  St Jude     PPM Model Number:  [id number]     PPM Serial Number:  [id number] PPM DOI:  04/15/2005     PPM Implanting MD:  Everardo Beals. Juanda Chance, MD  Lead 1    Location: RA     DOI: 04/10/1994     Model #: [id number]     Serial #: [id number]     Status: capped Lead 2    Location: RV     DOI: 04/10/1994     Model #: [id number]     Serial #: [id number]     Status: active  Magnet Response Rate:  BOL 98.6 ERI  86.3  Indications:  Sick sinus syndrome  Explantation Comments:  TTM's with [employer]  PPM Follow Up Pacer Dependent:  Yes      Episodes Coumadin:  Yes  Parameters Mode:  VVIR     Lower Rate Limit:  70      Upper Rate Limit:  110  Impression & Recommendations:  Problem # 1:  ATRIAL FIBRILLATION (ICD-427.31) chronic atrial fibrillation, on warfarin. I suggested that we closely monitor her balance. She currently has no history of falls.  Her updated medication list for this problem includes:    Warfarin Sodium 2 Mg Tabs (Warfarin sodium) ..... Use as directed by anticoagualtion clinic    Metoprolol Tartrate 25 Mg Tabs (Metoprolol tartrate) .Marland Kitchen... Take one tablet by mouth daily  Problem # 2:  DIASTOLIC HEART FAILURE, CHRONIC (ICD-428.32) She has learned to manage her weight with p.r.n. Lasix when her weight increases. No significant change in her shortness of breath on today's visit.  Her updated medication list for this problem includes:    Furosemide 80 Mg Tabs (Furosemide) .Marland Kitchen... 1/2 tablet  two times a day    Warfarin Sodium 2 Mg Tabs (Warfarin sodium) ..... Use as directed by anticoagualtion clinic    Lotensin 40 Mg Tabs (Benazepril hcl) .Marland Kitchen... Take 1 tablet by mouth once a day    Metoprolol Tartrate 25 Mg Tabs (Metoprolol tartrate) .Marland Kitchen... Take one tablet by mouth daily    Nitroglycerin 0.4 Mg Subl (Nitroglycerin) .Marland Kitchen... Place 1 tablet under tongue as directed  Problem # 3:  ESSENTIAL HYPERTENSION (ICD-401.9) Blood pressure is well controlled on her current medication regiment.  Her updated medication list for this problem includes:    Furosemide 80 Mg Tabs (Furosemide) .Marland Kitchen... 1/2 tablet two times a day    Lotensin 40 Mg Tabs (Benazepril hcl) .Marland Kitchen... Take 1 tablet by mouth once a day    Metoprolol Tartrate 25 Mg Tabs (Metoprolol tartrate) .Marland Kitchen... Take one tablet by mouth daily  Problem # 4:  PACEMAKER, PERMANENT (ICD-V45.01) We will have her followup with [facility] for her pacemaker check.  Patient Instructions: 1)  Your physician recommends that you schedule a follow-up appointment in: 6 months 2)  Your physician recommends that you continue on your current medications as  directed. Please refer to the Current Medication list given to you today.

## 2010-10-10 NOTE — Assessment & Plan Note (Signed)
Summary: ROA FOR 3-4 DAY FOLLOW-UP/JRR   Vital Signs:  Patient profile:   77 year old female O2 Sat:      88 % on Room air Temp:     99.2 degrees F oral Pulse rate:   77 / minute Pulse rhythm:   regular BP sitting:   123 / 72  (left arm) Cuff size:   large  Vitals Entered By: Mervin Hack CMA Duncan Dull) (September 27, 2010 12:15 PM)  O2 Flow:  Room air  History of Present Illness: Had fall 2 days ago Going down steps of deck and railing gave way Larey Seat down 4 steps, rolled in yard and hit head on pavement  CT done at ER x-rays of ribs done---okay  Almost totally disabled Given Rx for tylenol #3 and zofran--didn't fill it daughter-in-law stayed 2 nights ago but she sent her home yesterday  Has had some fluid leaking from left leg Discussed Baker's cyst on ultrasound has decreased the furosemide back to normal dose weight has been down at home  Allergies: 1)  ! Tramadol Hcl (Tramadol Hcl) 2)  Demerol (Meperidine Hcl) 3)  Sulfamethoxazole (Sulfamethoxazole)  Past History:  Past medical, surgical, family and social histories (including risk factors) reviewed for relevance to current acute and chronic problems.  Past Medical History: Reviewed history from 09/20/2010 and no changes required. 1. Chronic atrial fibrillation status post atrioventricular nodal     ablation. 2. Status post St. Jude Victory Pacemaker programmed to ventricular     demand pacing. 3. Diastolic heart failure with mild volume overload and increased     symptoms of shortness of breath. 4. Lumbar spine disease. 5. Degenerative joint disease.  6. HTN 7. Recurrent UTIs-------  Dr Artis Flock 8. Chronic sleep disturbance 9. Osteoporosis 10. Neuropathy 11.Gout  Past Surgical History: Reviewed history from 06/25/2010 and no changes required. Atrioventricular nodal    ablation.  St. Jude Victory Pacemaker programmed to ventricular   demand pacing.  Right knee surgery Placement of internal pulse generator  for permanent spinal cord   stimulator left hip. Hysterectomy Appendectomy Breast biopsy  Family History: Reviewed history from 06/25/2010 and no changes required.   Positive in that she has a mother who died of heart failure and a sister who has heart failure.  She also has a father who died of a stroke. 1 sister alive 55 others deceased Cancer in 2 (breast and pancreatic), 3 in accidents, strokes and vascular disease but they were all of advanced age  Social History: Reviewed history from 06/25/2010 and no changes required. Was homemaker Widow/Widower 1 daughter in [place], son in [place] Former Smoker--quit long ago Alcohol use-yes  Son Rosanne Ashing hold health care power of attorney. Does request DNR. No tube feeds if cognitively unaware  Review of Systems       No cough Has to breathe shallowly due to bruising  Physical Exam  General:  alert.  Clearly uncomfortable but not in distress Neck:  supple and no masses.   Lungs:  normal respiratory effort, no intercostal retractions, no accessory muscle use, and normal breath sounds.   Extremities:  edema almost gone on left leg--just slight edema at ankle No weeping   Impression & Recommendations:  Problem # 1:  CONTUSION-UNSPEC (ICD-924.9) Assessment New probably has rib bruises needs some assist---advised DIL to stay for the next few days till she is more moblie tramadol for as needed   Problem # 2:  VENOUS INSUFFICIENCY (ICD-459.81) Assessment: Improved edema is better Has incidental Baker's  cyst now back down on lasix dose will continue this now and increase as needed   Complete Medication List: 1)  Furosemide 80 Mg Tabs (Furosemide) .... 1/2 tablet two times a day 2)  Warfarin Sodium 2 Mg Tabs (Warfarin sodium) .... Use as directed by anticoagualtion clinic 3)  Lotensin 40 Mg Tabs (Benazepril hcl) .... Take 1 tablet by mouth once a day 4)  Metoprolol Tartrate 25 Mg Tabs (Metoprolol tartrate) .... Take one tablet by  mouth daily 5)  Potassium Chloride Crys Cr 20 Meq Cr-tabs (Potassium chloride crys cr) .... Take one tablet by mouth daily 6)  Nitroglycerin 0.4 Mg Subl (Nitroglycerin) .... Place 1 tablet under tongue as directed 7)  Gabapentin 100 Mg Caps (Gabapentin) .Marland Kitchen.. 1-2  caps  at bedtime as needed for spells of worsened foot pain 8)  Gabapentin 300 Mg Caps (Gabapentin) .Marland Kitchen.. 1 cap at bedtime for foot pain 9)  Trazodone Hcl 50 Mg Tabs (Trazodone hcl) .... Start with 1/2 tab for 4 days and if still not sleeping great, increase to a full tab 10)  Vitamin D 1000 Unit Tabs (Cholecalciferol) .... Take 1 tablet by mouth once a day 11)  Vitamin B-12 1000 Mcg Tabs (Cyanocobalamin) .... Take 1 by mouth once daily 12)  Colcrys 0.6 Mg Tabs (Colchicine) .Marland Kitchen.. 1 tab by mouth two times a day as needed for gout pain 13)  Tramadol Hcl 50 Mg Tabs (Tramadol hcl) .... 1/2-1 tab by mouth three times a day as needed for pain  Patient Instructions: 1)  Please schedule a follow-up appointment in 1 month.  Prescriptions: TRAMADOL HCL 50 MG TABS (TRAMADOL HCL) 1/2-1 tab by mouth three times a day as needed for pain  #30 x 0   Entered and Authorized by:   Cindee Salt MD   Signed by:   Cindee Salt MD on 09/27/2010   Method used:   Electronically to        [facility]* (retail)       [address]       [place], [place]  [address]       Ph: [phone]       Fax: [phone]   RxID:   [id number]    Orders Added: 1)  Est. Patient Level IV [84132]   CC: follow-up visit   Current Allergies (reviewed today): ! TRAMADOL HCL (TRAMADOL HCL) DEMEROL (MEPERIDINE HCL) SULFAMETHOXAZOLE (SULFAMETHOXAZOLE)

## 2010-10-10 NOTE — Medication Information (Signed)
Summary: Coumadin Clinic  Anticoagulant Therapy  Managed by: Cloyde Reams, RN, BSN Referring MD: Charlies Constable MD PCP: Lenox Ahr Supervising MD: Mariah Milling Indication 1: Atrial Fibrillation (ICD-427.31) Indication 2: HTN (ICD-401.1) Lab Used: [facility] Site: [place] INR POC 2.2 INR RANGE 2.0-3.0  Dietary changes: no    Health status changes: no    Bleeding/hemorrhagic complications: no    Recent/future hospitalizations: no    Any changes in medication regimen? no    Recent/future dental: no  Any missed doses?: no       Is patient compliant with meds? yes       Allergies: 1)  ! Tramadol Hcl (Tramadol Hcl) 2)  Demerol (Meperidine Hcl) 3)  Sulfamethoxazole (Sulfamethoxazole)  Anticoagulation Management History:      The patient is taking warfarin and comes in today for a routine follow up visit.  Positive risk factors for bleeding include an age of 75 years or older.  The bleeding index is 'intermediate risk'.  Positive CHADS2 values include History of CHF, History of HTN, and Age > 63 years old.  The start date was 12/26/1997.  Her last INR was 1.8.  Anticoagulation responsible provider: gollan.  INR POC: 2.2.  Exp: 06/2011.    Anticoagulation Management Assessment/Plan:      The patient's current anticoagulation dose is Warfarin sodium 2 mg tabs: Use as directed by Anticoagualtion Clinic.  The target INR is 2 - 3.  The next INR is due 09/11/2010.  Anticoagulation instructions were given to patient.  Results were reviewed/authorized by Cloyde Reams, RN, BSN.  She was notified by Cloyde Reams RN.         Prior Anticoagulation Instructions: INR 2.5 Continue previous dose of 2 tablets everyday. Recheck INR in 4 weeks.  Current Anticoagulation Instructions: INR 2.2  Continue on same dosage 2 tablets daily.  Recheck in 4 weeks.

## 2010-10-10 NOTE — Miscellaneous (Signed)
Summary: dx code correction  Clinical Lists Changes  Problems: Changed problem from PACEMAKER (ICD-V45.Marland Kitchen01) to PACEMAKER, PERMANENT (ICD-V45.01) changed the incorrect dx code to correct dx code Genella Mech  July 05, 2010 10:55 AM

## 2010-10-10 NOTE — Medication Information (Signed)
Summary: CCR/AMD  Anticoagulant Therapy  Managed by: Charlena Cross, RN, BSN Referring MD: Charlies Constable MD PCP: Early Chars MD: Bensimhon MD, Daniel Indication 1: Atrial Fibrillation (ICD-427.31) Indication 2: HTN (ICD-401.1) Lab Used: [facility] Site: [place] INR POC 1.6  Dietary changes: no    Health status changes: no    Bleeding/hemorrhagic complications: no    Recent/future hospitalizations: no    Any changes in medication regimen? no    Recent/future dental: no  Any missed doses?: no       Is patient compliant with meds? yes      Comments: used chlorhexadine last week which resulted in a questionable reading.  Allergies: 1)  Demerol (Meperidine Hcl) 2)  Sulfamethoxazole (Sulfamethoxazole)  Anticoagulation Management History:      The patient is taking warfarin and comes in today for a routine follow up visit.  Positive risk factors for bleeding include an age of 75 years or older.  The bleeding index is 'intermediate risk'.  Positive CHADS2 values include History of CHF and Age > 66 years old.  The start date was 12/26/1997.  Her last INR was 2.1 RATIO.  Anticoagulation responsible provider: Bensimhon MD, Reuel Boom.  INR POC: 1.6.    Anticoagulation Management Assessment/Plan:      The patient's current anticoagulation dose is Coumadin 2 mg tabs: as directed, Warfarin sodium 1 mg tabs: Use as directed by Anticoagualtion Clinic.  The target INR is 2 - 3.  The next INR is due 10/18/2009.  Anticoagulation instructions were given to patient.  Results were reviewed/authorized by Charlena Cross, RN, BSN.  She was notified by Charlena Cross, RN, BSN.         Prior Anticoagulation Instructions: none tomorrow, 1 mg on Sat then resume coumadin 2 mg daily with 1 mg on M W F  Current Anticoagulation Instructions: coumadin 2 mg today then resume 2mg  daily with 1 mg on M W F

## 2010-10-10 NOTE — Assessment & Plan Note (Signed)
Summary: rov   Visit Type:  Follow-up Primary Provider:  Dr.Gilbert  CC:  No complains.  History of Present Illness: This is an 77 year old white female patient who I saw recently for followup on diastolic heart failure and post hospital after an admission at [facility] with pneumonia. Overall she is doing quite well without complaints of dyspnea, dyspnea on exertion, chest pain, palpitations, dizziness, or presyncope. She is watching her fluid status closely, takes an extra Lasix if she needs it, and watches her salt intake closely. Her weight has been very stable.  Current Medications (verified): 1)  Furosemide 80 Mg Tabs (Furosemide) .... 1/2 Tablet Two Times A Day 2)  Warfarin Sodium 2 Mg Tabs (Warfarin Sodium) .... Use As Directed By Anticoagualtion Clinic 3)  Lotensin 40 Mg Tabs (Benazepril Hcl) .... Take 1 Tablet By Mouth Once A Day 4)  Nitroglycerin 0.4 Mg Subl (Nitroglycerin) .... Place 1 Tablet Under Tongue As Directed 5)  Metoprolol Tartrate 25 Mg Tabs (Metoprolol Tartrate) .... Take One Tablet By Mouth Daily 6)  Potassium Chloride Crys Cr 20 Meq Cr-Tabs (Potassium Chloride Crys Cr) .... Take One Tablet By Mouth Daily 7)  Vitamin D 1000 Unit Tabs (Cholecalciferol) .... Take 1 Tablet By Mouth Once A Day  Allergies: 1)  Demerol (Meperidine Hcl) 2)  Sulfamethoxazole (Sulfamethoxazole)  Past History:  Past Medical History: Last updated: 10/25/2008 1. Chronic atrial fibrillation status post atrioventricular nodal     ablation. 2. Status post St. Jude Victory Pacemaker programmed to ventricular     demand pacing. 3. Diastolic heart failure with mild volume overload and increased     symptoms of shortness of breath. 4. Lumbar spine disease. 5. Degenerative joint disease.   Review of Systems       see history of present illness  Vital Signs:  Patient profile:   77 year old female Height:      65 inches Weight:      178 pounds BMI:     29.73 Pulse rate:   71 /  minute Pulse rhythm:   irregular Resp:     18 per minute BP sitting:   118 / 76  (left arm) Cuff size:   large  Vitals Entered By: Vikki Ports (May 29, 2010 11:05 AM)  Physical Exam  General:   Well-nournished, in no acute distress. Neck: No JVD, HJR, Bruit, or thyroid enlargement Lungs: No tachypnea, clear without wheezing, rales, or rhonchi Cardiovascular: RRR, PMI not displaced, 2 are 6 systolic murmur at the left border, no gallops, bruit, thrill, or heave. Abdomen: BS normal. Soft without organomegaly, masses, lesions or tenderness. Extremities: venous stasis changes,without cyanosis, clubbing or edema. Good distal pulses bilateral SKin: Warm, no lesions or rashes  Musculoskeletal: No deformities Neuro: no focal signs    EKG  Procedure date:  05/29/2010  Findings:      paced rhythm  PPM Specifications Following MD:  Everardo Beals. Juanda Chance, MD     PPM Vendor:  St Jude     PPM Model Number:  [id number]     PPM Serial Number:  [id number] PPM DOI:  04/15/2005     PPM Implanting MD:  Everardo Beals. Juanda Chance, MD  Lead 1    Location: RA     DOI: 04/10/1994     Model #: [id number]     Serial #: [id number]     Status: capped Lead 2    Location: RV     DOI: 04/10/1994     Model #: [id number]  Serial #: [id number]     Status: active  Magnet Response Rate:  BOL 98.6 ERI  86.3  Indications:  Sick sinus syndrome  Explantation Comments:  TTM's with [employer]  PPM Follow Up Battery Voltage:  2.73 V     Battery Est. Longevity:  1.25-1.75 yrs     Pacer Dependent:  Yes     Right Ventricle  Amplitude: PACED mV, Impedance: 740 ohms, Threshold: 1.00 V at 0.6 msec  Episodes MS Episodes:  0     Coumadin:  Yes Ventricular High Rate:  0     Ventricular Pacing:  98%  Parameters Mode:  VVIR     Lower Rate Limit:  70     Upper Rate Limit:  110 Next Cardiology Appt Due:  11/07/2010 Tech Comments:  NORMAL DEVICE FUNCTION.  RECOMMENDED RV AUTOCAPTURE BUT PT FELT FUNNY IN HEAD WHEN TEST WAS RUNNING.  AUTOCAPTURE LEFT  OFF.  BATTERY LONGEVITY 1.25-1.75 YRS. ROV IN 6 MTHS W/DEVICE CLINIC AT [place]. Vella Kohler  May 29, 2010 1:37 PM   Impression & Recommendations:  Problem # 1:  DIASTOLIC HEART FAILURE, CHRONIC (ICD-428.32) Patient's heart failure is well compensated today. She takes extra Lasix when needed. We'll not make any further changes. Her updated medication list for this problem includes:    Furosemide 80 Mg Tabs (Furosemide) .Marland Kitchen... 1/2 tablet two times a day    Warfarin Sodium 2 Mg Tabs (Warfarin sodium) ..... Use as directed by anticoagualtion clinic    Lotensin 40 Mg Tabs (Benazepril hcl) .Marland Kitchen... Take 1 tablet by mouth once a day    Nitroglycerin 0.4 Mg Subl (Nitroglycerin) .Marland Kitchen... Place 1 tablet under tongue as directed    Metoprolol Tartrate 25 Mg Tabs (Metoprolol tartrate) .Marland Kitchen... Take one tablet by mouth daily  Problem # 2:  PACEMAKER (ICD-V45.Marland Kitchen01) Patient is pacer dependent. Her updated medication list for this problem includes:    Warfarin Sodium 2 Mg Tabs (Warfarin sodium) ..... Use as directed by anticoagualtion clinic    Metoprolol Tartrate 25 Mg Tabs (Metoprolol tartrate) .Marland Kitchen... Take one tablet by mouth daily  Problem # 3:  ATRIAL FIBRILLATION (ICD-427.31)  Patient is on Coumadin.Continue metoprolol at current dose. Her updated medication list for this problem includes:    Warfarin Sodium 2 Mg Tabs (Warfarin sodium) ..... Use as directed by anticoagualtion clinic    Metoprolol Tartrate 25 Mg Tabs (Metoprolol tartrate) .Marland Kitchen... Take one tablet by mouth daily  Orders: EKG w/ Interpretation (93000)  Patient Instructions: 1)  Your physician recommends that you schedule a follow-up appointment in: 6 months with Dr. Mariah Milling in [place]. 2)  Your physician recommends that you continue on your current medications as directed. Please refer to the Current Medication list given to you today.

## 2010-10-10 NOTE — Miscellaneous (Signed)
Summary: Do Not Resuscitate Order  Do Not Resuscitate Order   Imported By: Beau Fanny 06/25/2010 14:41:59  _____________________________________________________________________  External Attachment:    Type:   Image     Comment:   External Document

## 2010-10-10 NOTE — Cardiovascular Report (Signed)
Summary: TTM   TTM   Imported By: Roderic Ovens 07/18/2010 12:11:08  _____________________________________________________________________  External Attachment:    Type:   Image     Comment:   External Document

## 2010-10-10 NOTE — Cardiovascular Report (Signed)
Summary: Office Visit   Office Visit   Imported By: Roderic Ovens 11/29/2009 10:08:43  _____________________________________________________________________  External Attachment:    Type:   Image     Comment:   External Document

## 2010-10-10 NOTE — Medication Information (Signed)
Summary: CCR/AMD  Anticoagulant Therapy  Managed by: Charlena Cross, RN, BSN Referring MD: Charlies Constable MD PCP: Lenox Ahr Supervising MD: gollan Indication 1: Atrial Fibrillation (ICD-427.31) Indication 2: HTN (ICD-401.1) Lab Used: [facility] Site: [place] INR POC 1.8 INR RANGE 2.0-3.0  Dietary changes: no    Health status changes: no    Bleeding/hemorrhagic complications: no    Recent/future hospitalizations: no    Any changes in medication regimen? no    Recent/future dental: no  Any missed doses?: no       Is patient compliant with meds? yes       Allergies: 1)  Demerol (Meperidine Hcl) 2)  Sulfamethoxazole (Sulfamethoxazole)  Anticoagulation Management History:      The patient is taking warfarin and comes in today for a routine follow up visit.  Positive risk factors for bleeding include an age of 73 years or older.  The bleeding index is 'intermediate risk'.  Positive CHADS2 values include History of CHF and Age > 27 years old.  The start date was 12/26/1997.  Her last INR was 2.1 RATIO.  Anticoagulation responsible provider: gollan.  INR POC: 1.8.    Anticoagulation Management Assessment/Plan:      The patient's current anticoagulation dose is Warfarin sodium 2 mg tabs: Use as directed by Anticoagualtion Clinic.  The target INR is 2 - 3.  The next INR is due 01/02/2010.  Anticoagulation instructions were given to patient.  Results were reviewed/authorized by Charlena Cross, RN, BSN.  She was notified by Charlena Cross, RN, BSN.         Prior Anticoagulation Instructions: The patient is to continue with the same dose of coumadin.  This dosage includes: coumadin 2 mg daily with 1mg  on M W F  Current Anticoagulation Instructions: coumadin 2 mg today then coumadin 2 mg daily with 1mg  on M W F

## 2010-10-10 NOTE — Cardiovascular Report (Signed)
Summary: TTM   TTM   Imported By: Roderic Ovens 03/22/2010 15:44:38  _____________________________________________________________________  External Attachment:    Type:   Image     Comment:   External Document

## 2010-10-10 NOTE — Medication Information (Signed)
Summary: ccr/hm  Anticoagulant Therapy  Managed by: Cloyde Reams, RN, BSN Referring MD: Charlies Constable MD PCP: Lenox Ahr Supervising MD: Mariah Milling Indication 1: Atrial Fibrillation (ICD-427.31) Indication 2: HTN (ICD-401.1) Lab Used: [facility] Site: [place] INR POC 3.7 INR RANGE 2.0-3.0    Bleeding/hemorrhagic complications: no     Any changes in medication regimen? yes       Details: Complted last dosage of Prednisone and Zpak yesterday.   Any missed doses?: no       Is patient compliant with meds? yes       Allergies: 1)  Demerol (Meperidine Hcl) 2)  Sulfamethoxazole (Sulfamethoxazole)  Anticoagulation Management History:      The patient is taking warfarin and comes in today for a routine follow up visit.  Positive risk factors for bleeding include an age of 75 years or older.  The bleeding index is 'intermediate risk'.  Positive CHADS2 values include History of CHF and Age > 65 years old.  The start date was 12/26/1997.  Her last INR was 1.8.  Anticoagulation responsible provider: Gollan.  INR POC: 3.7.  Cuvette Lot#: [id number].  Exp: 01/2011.    Anticoagulation Management Assessment/Plan:      The patient's current anticoagulation dose is Warfarin sodium 2 mg tabs: Use as directed by Anticoagualtion Clinic.  The target INR is 2 - 3.  The next INR is due 01/28/2010.  Anticoagulation instructions were given to patient.  Results were reviewed/authorized by Cloyde Reams, RN, BSN.  She was notified by Cloyde Reams RN.         Prior Anticoagulation Instructions: coumadin 4 mg today then coumadin 2 mg daily with 1 mg on M and F  Current Anticoagulation Instructions: INR 3.7  Skip tomorrow's dosage of coumadin then resume dosage 2mg  daily except 1mg  on Mondays and Fridays.  Recheck in 2 weeks.

## 2010-10-10 NOTE — Cardiovascular Report (Signed)
Summary: Office Visit   Office Visit   Imported By: Roderic Ovens 06/07/2010 12:46:54  _____________________________________________________________________  External Attachment:    Type:   Image     Comment:   External Document

## 2010-10-10 NOTE — Assessment & Plan Note (Signed)
Summary: feet/dlo   Vital Signs:  Patient profile:   77 year old female Weight:      178 pounds O2 Sat:      95 % on Room air Temp:     98.4 degrees F oral Pulse rate:   74 / minute Pulse rhythm:   regular BP sitting:   140 / 80  (left arm) Cuff size:   large  Vitals Entered By: Mervin Hack CMA Duncan Dull) (July 16, 2010 11:39 AM)  O2 Flow:  Room air CC: both feet, hot and feeling numb   History of Present Illness: Having troubel with her feet It "feels like they are on fire" Keeps her up at night---burning pain has had this off and on  Diagnosed with neuropathy years ago Mild and intermittent Now much worse Has daytime symptoms but the night is the worst Ice packs at night have helped some  Has some chronic edema but not seemingly related to the degree of her pain  has had more SOB--relates this to not sleeping well  Allergies: 1)  ! Tramadol Hcl (Tramadol Hcl) 2)  Demerol (Meperidine Hcl) 3)  Sulfamethoxazole (Sulfamethoxazole)  Past History:  Past medical, surgical, family and social histories (including risk factors) reviewed for relevance to current acute and chronic problems.  Past Medical History: 1. Chronic atrial fibrillation status post atrioventricular nodal     ablation. 2. Status post St. Jude Victory Pacemaker programmed to ventricular     demand pacing. 3. Diastolic heart failure with mild volume overload and increased     symptoms of shortness of breath. 4. Lumbar spine disease. 5. Degenerative joint disease.  6. HTN 7. Recurrent UTIs-------  Dr Artis Flock 8. Chronic sleep disturbance 9. Osteoporosis 10. Neuropathy  Past Surgical History: Reviewed history from 06/25/2010 and no changes required. Atrioventricular nodal    ablation.  St. Jude Victory Pacemaker programmed to ventricular   demand pacing.  Right knee surgery Placement of internal pulse generator for permanent spinal cord   stimulator left  hip. Hysterectomy Appendectomy Breast biopsy  Family History: Reviewed history from 06/25/2010 and no changes required.   Positive in that she has a mother who died of heart failure and a sister who has heart failure.  She also has a father who died of a stroke. 1 sister alive 37 others deceased Cancer in 2 (breast and pancreatic), 3 in accidents, strokes and vascular disease but they were all of advanced age  Social History: Reviewed history from 06/25/2010 and no changes required. Was homemaker Widow/Widower 1 daughter in [place], son in [place] Former Smoker--quit long ago Alcohol use-yes  Son Rosanne Ashing hold health care power of attorney. Does request DNR. No tube feeds if cognitively unaware  Physical Exam  General:  alert and normal appearance.   Pulses:  1+ in feet Extremities:  trace ankle edema Neurologic:  strength normal in all extremities.   Mild decrease in fine touch sensation in feet   Impression & Recommendations:  Problem # 1:  NEUROPATHY (ICD-355.9) Assessment New  no totally new but clearly different and now a problem in need of treatment---at least for sleep Does use cane or walker for balance will start gabapentin at bedtime and titrate up  will check B12 level--is on oral supplement so will need parenteral if low  Orders: Venipuncture ([address]) TLB-B12, Serum-Total ONLY (60454-U98)  Complete Medication List: 1)  Trazodone Hcl 50 Mg Tabs (Trazodone hcl) .... Start with 1/2 tab for 4 days and if still not sleeping  great, increase to a full tab 2)  Furosemide 80 Mg Tabs (Furosemide) .... 1/2 tablet two times a day 3)  Warfarin Sodium 2 Mg Tabs (Warfarin sodium) .... Use as directed by anticoagualtion clinic 4)  Lotensin 40 Mg Tabs (Benazepril hcl) .... Take 1 tablet by mouth once a day 5)  Metoprolol Tartrate 25 Mg Tabs (Metoprolol tartrate) .... Take one tablet by mouth daily 6)  Potassium Chloride Crys Cr 20 Meq Cr-tabs (Potassium chloride crys cr) ....  Take one tablet by mouth daily 7)  Nitroglycerin 0.4 Mg Subl (Nitroglycerin) .... Place 1 tablet under tongue as directed 8)  Vitamin D 1000 Unit Tabs (Cholecalciferol) .... Take 1 tablet by mouth once a day 9)  Vitamin B-12 1000 Mcg Tabs (Cyanocobalamin) .... Take 1 by mouth once daily 10)  Gabapentin 100 Mg Caps (Gabapentin) .Marland Kitchen.. 1 at bedtime for foot pain and increase as directed  Patient Instructions: 1)  Please start gabapentin 100mg , 1 tab at bedtime for the pain in your feet. If no side effects and pain continues, go up to 2 tabs at bedtime after 2-3 nights, then increase to 3 tabs after another 2-3 nights if needed 2)  If you are not doing any better within about 2 weeks, please call 3)  Please schedule a follow-up appointment in 1 month.  Prescriptions: GABAPENTIN 100 MG CAPS (GABAPENTIN) 1 at bedtime for foot pain and increase as directed  #90 x 4   Entered and Authorized by:   Cindee Salt MD   Signed by:   Cindee Salt MD on 07/16/2010   Method used:   Electronically to        [facility]* (retail)       [address]       [place], [place]  [address]       Ph: [phone]       Fax: [phone]   RxID:   [id number]    Orders Added: 1)  Est. Patient Level III [84132] 2)  Venipuncture [44010] 3)  TLB-B12, Serum-Total ONLY [[id number]]    Current Allergies (reviewed today): ! TRAMADOL HCL (TRAMADOL HCL) DEMEROL (MEPERIDINE HCL) SULFAMETHOXAZOLE (SULFAMETHOXAZOLE)  Laboratory Results   Urine Tests  Date/Time Received: July 16, 2010 11:44 AM Date/Time Reported: July 16, 2010 11:44 AM  Routine Urinalysis   Color: yellow Appearance: Cloudy Glucose: negative   (Normal Range: Negative) Bilirubin: negative   (Normal Range: Negative) Ketone: negative   (Normal Range: Negative) Spec. Gravity: <1.005   (Normal Range: 1.003-1.035) Blood: trace-lysed   (Normal Range: Negative) pH: 7.5   (Normal Range: 5.0-8.0) Protein:  trace   (Normal Range: Negative) Urobilinogen: 0.2   (Normal Range: 0-1) Nitrite: positive   (Normal Range: Negative) Leukocyte Esterace: small   (Normal Range: Negative)

## 2010-10-10 NOTE — Progress Notes (Signed)
Summary: swollen left foot  Phone Note Call from Patient   Caller: Angelica Chessman from [facility] Call For: Cindee Salt MD Summary of Call: Angelica Chessman called stating that patient has swollen left foot, it is on the surface of the top of her foot, feels a little warm to the touch. Scheduled appt per Dr. Alphonsus Sias for tomorrow at 12:15.  Initial call taken by: Melody Comas,  September 19, 2010 2:45 PM  Follow-up for Phone Call        okay Follow-up by: Cindee Salt MD,  September 19, 2010 5:49 PM

## 2010-10-10 NOTE — Medication Information (Signed)
Summary: Carrie [name]  Anticoagulant Therapy  Managed by: Cloyde Reams, RN, BSN Referring MD: Charlies Constable MD PCP: Early Chars MD: Shirlee Latch MD, Dalton Indication 1: Atrial Fibrillation (ICD-427.31) Indication 2: HTN (ICD-401.1) Lab Used: [facility] Site: [place] INR POC 2.6 INR RANGE 2.0-3.0  Dietary changes: no    Health status changes: no    Bleeding/hemorrhagic complications: no    Recent/future hospitalizations: no    Any changes in medication regimen? no    Recent/future dental: no  Any missed doses?: no       Is patient compliant with meds? yes       Allergies: 1)  Demerol (Meperidine Hcl) 2)  Sulfamethoxazole (Sulfamethoxazole)  Anticoagulation Management History:      The patient is taking warfarin and comes in today for a routine follow up visit.  Positive risk factors for bleeding include an age of 75 years or older.  The bleeding index is 'intermediate risk'.  Positive CHADS2 values include History of CHF and Age > 94 years old.  The start date was 12/26/1997.  Her last INR was 1.8.  Anticoagulation responsible provider: Shirlee Latch MD, Dalton.  INR POC: 2.6.  Cuvette Lot#: [id number].  Exp: 07/2011.    Anticoagulation Management Assessment/Plan:      The patient's current anticoagulation dose is Warfarin sodium 2 mg tabs: Use as directed by Anticoagualtion Clinic.  The target INR is 2 - 3.  The next INR is due 07/03/2010.  Anticoagulation instructions were given to patient.  Results were reviewed/authorized by Cloyde Reams, RN, BSN.  She was notified by Cloyde Reams RN.         Prior Anticoagulation Instructions: INR 2.6  Continue on same dosage 2mg  daily.  Recheck in 4 weeks.    Current Anticoagulation Instructions: INR 2.6  Continue on same dosage 2mg  daily.  Recheck in 4 weeks.

## 2010-10-10 NOTE — Medication Information (Signed)
Summary: CCR/AMD  Anticoagulant Therapy  Managed by: Charlena Cross, RN, BSN Referring MD: Charlies Constable MD PCP: Early Chars MD: Genifer Lazenby MD, Rony Ratz Indication 1: Atrial Fibrillation (ICD-427.31) Indication 2: HTN (ICD-401.1) Lab Used: [facility] Site: [place] INR POC 1.9  Dietary changes: no    Health status changes: no    Bleeding/hemorrhagic complications: no    Recent/future hospitalizations: no    Any changes in medication regimen? no    Recent/future dental: no  Any missed doses?: no       Is patient compliant with meds? yes       Allergies: 1)  Demerol (Meperidine Hcl) 2)  Sulfamethoxazole (Sulfamethoxazole)  Anticoagulation Management History:      The patient is taking warfarin and comes in today for a routine follow up visit.  Positive risk factors for bleeding include an age of 75 years or older.  The bleeding index is 'intermediate risk'.  Positive CHADS2 values include History of CHF and Age > 75 years old.  The start date was 12/26/1997.  Her last INR was 2.1 RATIO.  Anticoagulation responsible provider: Arvell Pulsifer MD, Reuel Boom.  INR POC: 1.9.    Anticoagulation Management Assessment/Plan:      The patient's current anticoagulation dose is Coumadin 2 mg tabs: as directed, Warfarin sodium 1 mg tabs: Use as directed by Anticoagualtion Clinic.  The target INR is 2 - 3.  The next INR is due 10/18/2009.  Anticoagulation instructions were given to patient.  Results were reviewed/authorized by Charlena Cross, RN, BSN.  She was notified by Charlena Cross, RN, BSN.         Prior Anticoagulation Instructions: coumadin 2 mg today then resume 2mg  daily with 1 mg on M W F   Current Anticoagulation Instructions: coumadin 3 mg today then coumadin 2 mg daily with 1 mg on M and F

## 2010-10-10 NOTE — Assessment & Plan Note (Signed)
Summary: ROA FOR FOLLOW-UP/JRR   Vital Signs:  Patient profile:   77 year old female Weight:      181 pounds Temp:     98.0 degrees F oral Pulse rate:   70 / minute Pulse rhythm:   regular BP sitting:   154 / 82  (left arm) Cuff size:   large  Vitals Entered By: Mervin Hack CMA Duncan Dull) (September 23, 2010 12:55 PM) CC: follow-up visit   History of Present Illness: Still having a lot of swelling in left foot Lots of pain still as well  Did note some pain relief by the 3rd colcrys right foot feels better  still with sig left foot edema--despite the increased lasix very tight and uncomfortable  Allergies: 1)  ! Tramadol Hcl (Tramadol Hcl) 2)  Demerol (Meperidine Hcl) 3)  Sulfamethoxazole (Sulfamethoxazole)  Past History:  Past medical, surgical, family and social histories (including risk factors) reviewed for relevance to current acute and chronic problems.  Past Medical History: Reviewed history from 09/20/2010 and no changes required. 1. Chronic atrial fibrillation status post atrioventricular nodal     ablation. 2. Status post St. Jude Victory Pacemaker programmed to ventricular     demand pacing. 3. Diastolic heart failure with mild volume overload and increased     symptoms of shortness of breath. 4. Lumbar spine disease. 5. Degenerative joint disease.  6. HTN 7. Recurrent UTIs-------  Dr Artis Flock 8. Chronic sleep disturbance 9. Osteoporosis 10. Neuropathy 11.Gout  Past Surgical History: Reviewed history from 06/25/2010 and no changes required. Atrioventricular nodal    ablation.  St. Jude Victory Pacemaker programmed to ventricular   demand pacing.  Right knee surgery Placement of internal pulse generator for permanent spinal cord   stimulator left hip. Hysterectomy Appendectomy Breast biopsy  Family History: Reviewed history from 06/25/2010 and no changes required.   Positive in that she has a mother who died of heart failure and a sister who has  heart failure.  She also has a father who died of a stroke. 1 sister alive 28 others deceased Cancer in 2 (breast and pancreatic), 3 in accidents, strokes and vascular disease but they were all of advanced age  Social History: Reviewed history from 06/25/2010 and no changes required. Was homemaker Widow/Widower 1 daughter in [place], son in [place] Former Smoker--quit long ago Alcohol use-yes  Son Rosanne Ashing hold health care power of attorney. Does request DNR. No tube feeds if cognitively unaware  Review of Systems       weight is down just 1# since Friday appetite is off some but okay  Physical Exam  General:  alert.  NAD Msk:  Right 1st MTP is not red or tender Left 1st MTP without redness some stasis changes (hyperpigmentation) of foot Extremities:  1-2+ edema in left foot and distal calf Homan's negative Skin:  no cellulitis in feet   Impression & Recommendations:  Problem # 1:  GOUT (ICD-274.9) Assessment Improved  Better on the colcrys will have her take for a few more days  Her updated medication list for this problem includes:    Colcrys 0.6 Mg Tabs (Colchicine) .Marland Kitchen... 1 tab by mouth two times a day as needed for gout pain  Orders: TLB-Uric Acid, Blood (84550-URIC)  Problem # 2:  VENOUS INSUFFICIENCY (ICD-459.81) Assessment: Deteriorated  edema still prominent and what she is concerned about I will check labs increase furosemide to 80mg  two times a day  check venous doppler though DVT seems to be unlikely  would  plan outpatient Rx with lovenox and coumadin if DVT found  Orders: Doppler Referral (Doppler) Venipuncture (45409) TLB-Renal Function Panel (80069-RENAL) TLB-CBC Platelet - w/Differential (85025-CBCD)  Complete Medication List: 1)  Furosemide 80 Mg Tabs (Furosemide) .... 1/2 tablet two times a day 2)  Warfarin Sodium 2 Mg Tabs (Warfarin sodium) .... Use as directed by anticoagualtion clinic 3)  Lotensin 40 Mg Tabs (Benazepril hcl) .... Take 1  tablet by mouth once a day 4)  Metoprolol Tartrate 25 Mg Tabs (Metoprolol tartrate) .... Take one tablet by mouth daily 5)  Potassium Chloride Crys Cr 20 Meq Cr-tabs (Potassium chloride crys cr) .... Take one tablet by mouth daily 6)  Nitroglycerin 0.4 Mg Subl (Nitroglycerin) .... Place 1 tablet under tongue as directed 7)  Gabapentin 100 Mg Caps (Gabapentin) .Marland Kitchen.. 1-2  caps  at bedtime as needed for spells of worsened foot pain 8)  Gabapentin 300 Mg Caps (Gabapentin) .Marland Kitchen.. 1 cap at bedtime for foot pain 9)  Trazodone Hcl 50 Mg Tabs (Trazodone hcl) .... Start with 1/2 tab for 4 days and if still not sleeping great, increase to a full tab 10)  Vitamin D 1000 Unit Tabs (Cholecalciferol) .... Take 1 tablet by mouth once a day 11)  Vitamin B-12 1000 Mcg Tabs (Cyanocobalamin) .... Take 1 by mouth once daily 12)  Colcrys 0.6 Mg Tabs (Colchicine) .Marland Kitchen.. 1 tab by mouth two times a day as needed for gout pain  Patient Instructions: 1)  Please take the colcrys daily for the next 5 days then stop 2)  Please take furosemide 80mg  two times a day till follow up appt 3)  Please make follow up in 3-4 days    Orders Added: 1)  Doppler Referral [Doppler] 2)  Est. Patient Level III [81191] 3)  Venipuncture [36415] 4)  TLB-Renal Function Panel [80069-RENAL] 5)  TLB-CBC Platelet - w/Differential [85025-CBCD] 6)  TLB-Uric Acid, Blood [84550-URIC]    Current Allergies (reviewed today): ! TRAMADOL HCL (TRAMADOL HCL) DEMEROL (MEPERIDINE HCL) SULFAMETHOXAZOLE (SULFAMETHOXAZOLE)

## 2010-10-10 NOTE — Medication Information (Signed)
Summary: ccr  Anticoagulant Therapy  Managed by: Charlena Cross, RN, BSN Referring MD: Charlies Constable MD PCP: Early Chars MD: Bensimhon MD, Daniel Indication 1: Atrial Fibrillation (ICD-427.31) Indication 2: HTN (ICD-401.1) Lab Used: [facility] Site: [place] INR POC 4.6  Dietary changes: no    Health status changes: yes       Details: had the flu  Bleeding/hemorrhagic complications: no    Recent/future hospitalizations: no    Any changes in medication regimen? no    Recent/future dental: no  Any missed doses?: no       Is patient compliant with meds? yes       Allergies: 1)  Demerol (Meperidine Hcl) 2)  Sulfamethoxazole (Sulfamethoxazole)  Anticoagulation Management History:      The patient is taking warfarin and comes in today for a routine follow up visit.  Positive risk factors for bleeding include an age of 75 years or older.  The bleeding index is 'intermediate risk'.  Positive CHADS2 values include History of CHF and Age > 93 years old.  The start date was 12/26/1997.  Her last INR was 2.1 RATIO.  Anticoagulation responsible provider: Bensimhon MD, Reuel Boom.  INR POC: 4.6.    Anticoagulation Management Assessment/Plan:      The patient's current anticoagulation dose is Coumadin 2 mg tabs: as directed, Warfarin sodium 1 mg tabs: Use as directed by Anticoagualtion Clinic.  The target INR is 2 - 3.  The next INR is due 09/27/2009.  Anticoagulation instructions were given to patient.  Results were reviewed/authorized by Charlena Cross, RN, BSN.  She was notified by Charlena Cross, RN, BSN.         Prior Anticoagulation Instructions: The patient is to continue with the same dose of coumadin.  This dosage includes: 2mg  everyday except 1mg  on M, W, and F  Current Anticoagulation Instructions: none tomorrow, 1 mg on Sat then resume coumadin 2 mg daily with 1 mg on M W F

## 2010-10-10 NOTE — Medication Information (Signed)
Summary: CCR  Anticoagulant Therapy  Managed by: Charlena Cross, RN, BSN Referring MD: Charlies Constable MD PCP: Early Chars MD: Bensimhon MD, Daniel Indication 1: Atrial Fibrillation (ICD-427.31) Indication 2: HTN (ICD-401.1) Lab Used: [facility] Site: [place] INR POC 2.0  Dietary changes: no    Health status changes: no    Bleeding/hemorrhagic complications: no    Recent/future hospitalizations: no    Any changes in medication regimen? no    Recent/future dental: no  Any missed doses?: no       Is patient compliant with meds? yes       Allergies: 1)  Demerol (Meperidine Hcl) 2)  Sulfamethoxazole (Sulfamethoxazole)  Anticoagulation Management History:      The patient is taking warfarin and comes in today for a routine follow up visit.  Positive risk factors for bleeding include an age of 29 years or older.  The bleeding index is 'intermediate risk'.  Positive CHADS2 values include History of CHF and Age > 34 years old.  The start date was 12/26/1997.  Her last INR was 2.1 RATIO.  Anticoagulation responsible provider: Bensimhon MD, Reuel Boom.  INR POC: 2.0.    Anticoagulation Management Assessment/Plan:      The patient's current anticoagulation dose is Coumadin 2 mg tabs: as directed, Warfarin sodium 1 mg tabs: Use as directed by Anticoagualtion Clinic.  The target INR is 2 - 3.  The next INR is due 12/03/2009.  Anticoagulation instructions were given to patient.  Results were reviewed/authorized by Charlena Cross, RN, BSN.  She was notified by Charlena Cross, RN, BSN.         Prior Anticoagulation Instructions: coumadin 3 mg today then coumadin 2 mg daily with 1 mg on M and F  Current Anticoagulation Instructions: The patient is to continue with the same dose of coumadin.  This dosage includes: coumadin 2 mg daily with 1mg  on M W F

## 2010-10-10 NOTE — Medication Information (Signed)
Summary: rov/ewj  Anticoagulant Therapy  Managed by: Cloyde Reams, RN, BSN Referring MD: Charlies Constable MD PCP: Early Chars MD: Shirlee Latch MD, Dalton Indication 1: Atrial Fibrillation (ICD-427.31) Indication 2: HTN (ICD-401.1) Lab Used: [facility] Site: [place] INR POC 2.6 INR RANGE 2.0-3.0  Dietary changes: no    Health status changes: no    Bleeding/hemorrhagic complications: no    Recent/future hospitalizations: no    Any changes in medication regimen? no    Recent/future dental: no  Any missed doses?: no       Is patient compliant with meds? yes       Allergies: 1)  Demerol (Meperidine Hcl) 2)  Sulfamethoxazole (Sulfamethoxazole)  Anticoagulation Management History:      The patient is taking warfarin and comes in today for a routine follow up visit.  Positive risk factors for bleeding include an age of 30 years or older.  The bleeding index is 'intermediate risk'.  Positive CHADS2 values include History of CHF and Age > 40 years old.  The start date was 12/26/1997.  Her last INR was 1.8.  Anticoagulation responsible Issa Kosmicki: Shirlee Latch MD, Dalton.  INR POC: 2.6.  Cuvette Lot#: [id number].  Exp: 06/2011.    Anticoagulation Management Assessment/Plan:      The patient's current anticoagulation dose is Warfarin sodium 2 mg tabs: Use as directed by Anticoagualtion Clinic.  The target INR is 2 - 3.  The next INR is due 06/05/2010.  Anticoagulation instructions were given to patient.  Results were reviewed/authorized by Cloyde Reams, RN, BSN.  She was notified by Cloyde Reams RN.         Prior Anticoagulation Instructions: INR 1.7  Take 3mg  tomorrow, then resume same dosage 2mg  daily.  Recheck in 3 weeks.    Current Anticoagulation Instructions: INR 2.6  Continue on same dosage 2mg  daily.  Recheck in 4 weeks.

## 2010-10-10 NOTE — Progress Notes (Signed)
Summary: call a nurse   Phone Note Call from Patient   Call For: Cindee Salt MD Summary of Call: Triage Record Num: [id number] Operator: Elita Boone Patient Name: Carrie [name] Call Date & Time: 09/28/2010 10:33:56AM Patient Phone: [phone] PCP: Patient Gender: Female PCP Fax : Patient DOB: [birth year] Practice Name: [facility] Reason for Call: Lynden Ang, LPN/[facility] is calling to report that pt is short of breath and left arm is sore. Pt had a fall on 01/ 18. Pulse ox is 88. Pt has 02/2L. BP 108/60, pulse 77 Pain with movement. Cathy instructed to activat 911 for shortness of breath. Protocol(s) Used: Chest Pain Recommended Outcome per Protocol: Activate EMS 911 Reason for Outcome: Chest pain now or within last hour AND known coronary artery disease (history of MI, angina, PCI or cardiac surgery) Care Advice:  ~ IMMEDIATE ACTION Write down provider's name. List or place the following in a bag for transport with the patient: current prescription and/or nonprescription medications; alternative treatments, therapies and medications; and street drugs.  ~ 01/ Initial call taken by: Melody Comas,  September 30, 2010 8:13 AM  Follow-up for Phone Call        sent to ER  admitted today to health care Follow-up by: Cindee Salt MD,  September 30, 2010 10:14 AM

## 2010-10-10 NOTE — Cardiovascular Report (Signed)
Summary: TTM   TTM   Imported By: Roderic Ovens 12/06/2009 13:35:03  _____________________________________________________________________  External Attachment:    Type:   Image     Comment:   External Document

## 2010-10-10 NOTE — Progress Notes (Signed)
Summary: Question about meds  Phone Note Call from Patient Call back at Home Phone [phone]   Caller: daughter, Darl Pikes Call For: Cindee Salt MD Summary of Call: pt's daughter states that pt had a restless night, pt woke up during the night, feels dizzy and having dry heaves. Pt took Tramadol and reg Tylenlol, per daughter pt thinks that all pain meds make her sick, so daughter says it's in pt's head that the Tramadol is making her sick, should pt try again tonight? she knows it takes time for meds to work but should she try again? Initial call taken by: Mervin Hack CMA Duncan Dull),  June 26, 2010 10:26 AM  Follow-up for Phone Call        Have her try half the tab at bedtime  It may be better tolerated also if she takes it not on an empty stomach---a couple of crackers or some milk before the tab (this can be an hour or so before bedtime) may take away any nausea  If it persists, I will try her on the sleep med I discussed Cindee Salt MD  June 26, 2010 1:56 PM   left detailed message on answering machine, daughter stated earlier it was ok, I did advised them to call me back if any questions. Follow-up by: Mervin Hack CMA Duncan Dull),  June 26, 2010 3:36 PM     Appended Document: Question about meds Patient LMOM in triage asking if you could give her a call back regarding the message.   Appended Document: Question about meds see phone note dated 06/27/10

## 2010-10-10 NOTE — Progress Notes (Signed)
Summary: results from urine  Phone Note Outgoing Call Call back at [facility] Phone [phone]   Call placed by: DeShannon Katrinka Blazing CMA Duncan Dull),  July 18, 2010 10:42 AM Call placed to: Patient Summary of Call: calling patient about lab results and she asked about the results from her urine that she gave on her office visit, please advise if anything should be done. Initial call taken by: Mervin Hack CMA Duncan Dull),  July 18, 2010 10:43 AM  Follow-up for Phone Call        the urine was borderline  If she is having pain with urine we can try an antibiotic, but if no sig problems, no meds needed Follow-up by: Cindee Salt MD,  July 18, 2010 1:10 PM  Additional Follow-up for Phone Call Additional follow up Details #1::        pt states she having some pain, but the odor is the worst thing, she would like something called in to [facility]. DeShannon Katrinka Blazing CMA Duncan Dull)  July 18, 2010 4:32 PM    order sent patient notified Additional Follow-up by: Cindee Salt MD,  July 18, 2010 5:50 PM    New/Updated Medications: CEFUROXIME AXETIL 250 MG TABS (CEFUROXIME AXETIL) 1 tab by mouth two times a day for bladder infection Prescriptions: CEFUROXIME AXETIL 250 MG TABS (CEFUROXIME AXETIL) 1 tab by mouth two times a day for bladder infection  #14 x 0   Entered and Authorized by:   Cindee Salt MD   Signed by:   Cindee Salt MD on 07/18/2010   Method used:   Electronically to        [facility]* (retail)       [address]       [place], [place]  [address]       Ph: [phone]       Fax: [phone]   RxID:   [id number]

## 2010-10-10 NOTE — Medication Information (Signed)
Summary: CCR/NE  Anticoagulant Therapy  Managed by: Cloyde Reams, RN, BSN Referring MD: Charlies Constable MD PCP: Lenox Ahr Supervising MD: Mariah Milling Indication 1: Atrial Fibrillation (ICD-427.31) Indication 2: HTN (ICD-401.1) Lab Used: [facility] Site: [place] INR POC 1.7 INR RANGE 2.0-3.0  Dietary changes: no    Health status changes: no    Bleeding/hemorrhagic complications: no    Recent/future hospitalizations: no    Any changes in medication regimen? no    Recent/future dental: no  Any missed doses?: no       Is patient compliant with meds? yes       Allergies: 1)  Demerol (Meperidine Hcl) 2)  Sulfamethoxazole (Sulfamethoxazole)  Anticoagulation Management History:      The patient is taking warfarin and comes in today for a routine follow up visit.  Positive risk factors for bleeding include an age of 75 years or older.  The bleeding index is 'intermediate risk'.  Positive CHADS2 values include History of CHF and Age > 82 years old.  The start date was 12/26/1997.  Her last INR was 1.8.  Anticoagulation responsible provider: Kyrin Garn.  INR POC: 1.7.  Cuvette Lot#: [id number].  Exp: 06/2011.    Anticoagulation Management Assessment/Plan:      The patient's current anticoagulation dose is Warfarin sodium 2 mg tabs: Use as directed by Anticoagualtion Clinic.  The target INR is 2 - 3.  The next INR is due 05/08/2010.  Anticoagulation instructions were given to patient.  Results were reviewed/authorized by Cloyde Reams, RN, BSN.  She was notified by Cloyde Reams RN.         Prior Anticoagulation Instructions: INR 2.8  Continue on same dosage 2mg  daily.  Recheck in 4 weeks.    Current Anticoagulation Instructions: INR 1.7  Take 3mg  tomorrow, then resume same dosage 2mg  daily.  Recheck in 3 weeks.

## 2010-10-10 NOTE — Medication Information (Signed)
Summary: CCR/AMD  Anticoagulant Therapy  Managed by: Lanny Hurst, RN Referring MD: Charlies Constable MD PCP: Dr. Pam Drown MD: Mariah Milling Indication 1: Atrial Fibrillation (ICD-427.31) Indication 2: HTN (ICD-401.1) Lab Used: [facility] Site: [place] INR POC 2.6 INR RANGE 2.0-3.0  Dietary changes: no    Health status changes: no    Bleeding/hemorrhagic complications: no    Recent/future hospitalizations: no    Any changes in medication regimen? no    Recent/future dental: no  Any missed doses?: no       Is patient compliant with meds? yes       Allergies: 1)  ! Tramadol Hcl (Tramadol Hcl) 2)  Demerol (Meperidine Hcl) 3)  Sulfamethoxazole (Sulfamethoxazole)  Anticoagulation Management History:      Positive risk factors for bleeding include an age of 75 years or older.  The bleeding index is 'intermediate risk'.  Positive CHADS2 values include History of CHF, History of HTN, and Age > 75 years old.  The start date was 12/26/1997.  Her last INR was 1.8.  Anticoagulation responsible provider: Antwione Picotte.  INR POC: 2.6.  Exp: 06/2011.    Anticoagulation Management Assessment/Plan:      The patient's current anticoagulation dose is Warfarin sodium 2 mg tabs: Use as directed by Anticoagualtion Clinic.  The target INR is 2 - 3.  The next INR is due 10/09/2010.  Anticoagulation instructions were given to patient.  Results were reviewed/authorized by Lanny Hurst, RN.  She was notified by Lanny Hurst RN.         Prior Anticoagulation Instructions: INR 2.2  Continue on same dosage 2 tablets daily.  Recheck in 4 weeks.   Current Anticoagulation Instructions: INR 2.6  Continue same dosage 2mg  once daily. Recheck in 4 weeks.

## 2010-10-10 NOTE — Assessment & Plan Note (Signed)
Summary: 1 MTH FU/CLE   Vital Signs:  Patient profile:   77 year old female Weight:      179 pounds Temp:     98.0 degrees F oral BP sitting:   140 / 78  (left arm) Cuff size:   regular  Vitals Entered By: Mervin Hack CMA Duncan Dull) (August 16, 2010 12:19 PM) CC: 1 month follow-up   History of Present Illness: Met with Dr Mariah Milling and was pleased with him  Much better with her feet Still has occ "attack" at night--will still keep her up Now up to 300mg  nightly  heart has been fine  Couldn't tolerate the trazodone had AM confusion Back to tylenol PM---she has no side effects with this  Allergies: 1)  ! Tramadol Hcl (Tramadol Hcl) 2)  Demerol (Meperidine Hcl) 3)  Sulfamethoxazole (Sulfamethoxazole)  Past History:  Past medical, surgical, family and social histories (including risk factors) reviewed for relevance to current acute and chronic problems.  Past Medical History: Reviewed history from 07/16/2010 and no changes required. 1. Chronic atrial fibrillation status post atrioventricular nodal     ablation. 2. Status post St. Jude Victory Pacemaker programmed to ventricular     demand pacing. 3. Diastolic heart failure with mild volume overload and increased     symptoms of shortness of breath. 4. Lumbar spine disease. 5. Degenerative joint disease.  6. HTN 7. Recurrent UTIs-------  Dr Artis Flock 8. Chronic sleep disturbance 9. Osteoporosis 10. Neuropathy  Past Surgical History: Reviewed history from 06/25/2010 and no changes required. Atrioventricular nodal    ablation.  St. Jude Victory Pacemaker programmed to ventricular   demand pacing.  Right knee surgery Placement of internal pulse generator for permanent spinal cord   stimulator left hip. Hysterectomy Appendectomy Breast biopsy  Family History: Reviewed history from 06/25/2010 and no changes required.   Positive in that she has a mother who died of heart failure and a sister who has heart failure.   She also has a father who died of a stroke. 1 sister alive 39 others deceased Cancer in 2 (breast and pancreatic), 3 in accidents, strokes and vascular disease but they were all of advanced age  Social History: Reviewed history from 06/25/2010 and no changes required. Was homemaker Widow/Widower 1 daughter in [place], son in [place] Former Smoker--quit long ago Alcohol use-yes  Son Rosanne Ashing hold health care power of attorney. Does request DNR. No tube feeds if cognitively unaware  Physical Exam  General:  alert and normal appearance.   Psych:  normally interactive, good eye contact, not anxious appearing, and not depressed appearing.     Impression & Recommendations:  Problem # 1:  NEUROPATHY (ICD-355.9) Assessment Improved doing much better on the gabapentin will change to the 300mg  tab at bedtime can take an extra 100-200 if she has a bad "attack" like recently counselled more that half of 15 minute visit  Problem # 2:  SLEEP DISORDER, CHRONIC (ICD-780.50) Assessment: Improved doing better with the gabapentin and tylenol PM  Complete Medication List: 1)  Furosemide 80 Mg Tabs (Furosemide) .... 1/2 tablet two times a day 2)  Warfarin Sodium 2 Mg Tabs (Warfarin sodium) .... Use as directed by anticoagualtion clinic 3)  Lotensin 40 Mg Tabs (Benazepril hcl) .... Take 1 tablet by mouth once a day 4)  Metoprolol Tartrate 25 Mg Tabs (Metoprolol tartrate) .... Take one tablet by mouth daily 5)  Potassium Chloride Crys Cr 20 Meq Cr-tabs (Potassium chloride crys cr) .... Take one tablet by  mouth daily 6)  Nitroglycerin 0.4 Mg Subl (Nitroglycerin) .... Place 1 tablet under tongue as directed 7)  Vitamin D 1000 Unit Tabs (Cholecalciferol) .... Take 1 tablet by mouth once a day 8)  Vitamin B-12 1000 Mcg Tabs (Cyanocobalamin) .... Take 1 by mouth once daily 9)  Gabapentin 100 Mg Caps (Gabapentin) .Marland Kitchen.. 1-2  caps  at bedtime as needed for spells of worsened foot pain 10)  Gabapentin 300 Mg  Caps (Gabapentin) .Marland Kitchen.. 1 cap at bedtime for foot pain 11)  Tylenol Pm Extra Strength 500-25 Mg Tabs (Diphenhydramine-apap (sleep)) .... 2 tabs at bedtime as needed for sleep problems  Patient Instructions: 1)  Please schedule a follow-up appointment in 4 months .  Prescriptions: GABAPENTIN 300 MG CAPS (GABAPENTIN) 1 cap at bedtime for foot pain  #30 x 11   Entered and Authorized by:   Cindee Salt MD   Signed by:   Cindee Salt MD on 08/16/2010   Method used:   Electronically to        [facility]* (retail)       [address]       [place], [place]  [address]       Ph: [phone]       Fax: [phone]   RxID:   [id number]    Orders Added: 1)  Est. Patient Level III [24401]    Current Allergies (reviewed today): ! TRAMADOL HCL (TRAMADOL HCL) DEMEROL (MEPERIDINE HCL) SULFAMETHOXAZOLE (SULFAMETHOXAZOLE)

## 2010-10-10 NOTE — Medication Information (Signed)
Summary: CCR/AMD  Anticoagulant Therapy  Managed by: Cloyde Reams, RN, BSN Referring MD: Charlies Constable MD PCP: Lenox Ahr Supervising MD: Mariah Milling Indication 1: Atrial Fibrillation (ICD-427.31) Indication 2: HTN (ICD-401.1) Lab Used: [facility] Site: [place] INR POC 2.5 INR RANGE 2.0-3.0  Dietary changes: no    Health status changes: no    Bleeding/hemorrhagic complications: no    Recent/future hospitalizations: yes       Details: Pt has been in hospital for PNA x 1 week middle of May.  Primary MD has been following coumadin.    Any changes in medication regimen? yes       Details: Pt has been on prednisone and abxs off allmeds x 2 weeks.  Recent/future dental: no  Any missed doses?: no       Is patient compliant with meds? yes      Comments: Pt states she is taking 2mg  daily and has been on same dosage x 2-3 weeks.    Allergies: 1)  Demerol (Meperidine Hcl) 2)  Sulfamethoxazole (Sulfamethoxazole)  Anticoagulation Management History:      The patient is taking warfarin and comes in today for a routine follow up visit.  Positive risk factors for bleeding include an age of 75 years or older.  The bleeding index is 'intermediate risk'.  Positive CHADS2 values include History of CHF and Age > 70 years old.  The start date was 12/26/1997.  Her last INR was 1.8.  Anticoagulation responsible provider: Liyat Faulkenberry.  INR POC: 2.5.  Cuvette Lot#: [id number].  Exp: 05/2011.    Anticoagulation Management Assessment/Plan:      The patient's current anticoagulation dose is Warfarin sodium 2 mg tabs: Use as directed by Anticoagualtion Clinic.  The target INR is 2 - 3.  The next INR is due 03/19/2010.  Anticoagulation instructions were given to patient.  Results were reviewed/authorized by Cloyde Reams, RN, BSN.  She was notified by Cloyde Reams RN.         Prior Anticoagulation Instructions: INR 3.7  Skip tomorrow's dosage of coumadin then resume  dosage 2mg  daily except 1mg  on Mondays and Fridays.  Recheck in 2 weeks.    Current Anticoagulation Instructions: INR 2.5  Continue on same dosage 2mg  daily.  Recheck in 3 weeks.

## 2010-10-10 NOTE — Cardiovascular Report (Signed)
Summary: TTM   TTM   Imported By: Roderic Ovens 06/06/2010 16:08:13  _____________________________________________________________________  External Attachment:    Type:   Image     Comment:   External Document

## 2010-10-10 NOTE — Assessment & Plan Note (Signed)
Summary: f70m   Visit Type:  Follow-up Primary Provider:  Dr.Gilbert  CC:  no cardaic complaints.  History of Present Illness: The patient is 77 years old and return for followup management of diastolic heart failure and atrial fibrillation and pacemaker. She has chronic atrial fibrillation and has had AV nodal ablation and has a VVI pacer in place. She also has a history of diastolic heart failure. She's been doing fairly well recently with this and feels he has to she has had very little fluid she recently had a blood test at [facility] clinic and was told that her kidney function was abnormal.    She recently had mammography ordered by Dr. Sullivan Lone and was found to have a mass and will need to have surgery surgery with biopsy. She is consulting with Dr. Sullivan Lone about this and has not yet decided whether to have this done in [place] or in [place].  Current Medications (verified): 1)  Furosemide 80 Mg Tabs (Furosemide) .Marland Kitchen.. 1 Tab One Day Then 1/2 of Oa Tab The Next Day Alternating Daily 2)  Warfarin Sodium 2 Mg Tabs (Warfarin Sodium) .... Use As Directed By Anticoagualtion Clinic 3)  Lotensin 40 Mg Tabs (Benazepril Hcl) .... Take 1 Tablet By Mouth Once A Day 4)  Nitroglycerin 0.4 Mg Subl (Nitroglycerin) .... Place 1 Tablet Under Tongue As Directed 5)  Metoprolol Tartrate 25 Mg Tabs (Metoprolol Tartrate) .... Take One Tablet By Mouth Daily 6)  Potassium Chloride Crys Cr 20 Meq Cr-Tabs (Potassium Chloride Crys Cr) .... Take One Tablet By Mouth Daily  Allergies (verified): 1)  Demerol (Meperidine Hcl) 2)  Sulfamethoxazole (Sulfamethoxazole)  Past History:  Past Medical History: Reviewed history from 10/25/2008 and no changes required. 1. Chronic atrial fibrillation status post atrioventricular nodal     ablation. 2. Status post St. Jude Victory Pacemaker programmed to ventricular     demand pacing. 3. Diastolic heart failure with mild volume overload and increased     symptoms of  shortness of breath. 4. Lumbar spine disease. 5. Degenerative joint disease.   Review of Systems       ROS is negative except as outlined in HPI.   Vital Signs:  Patient profile:   77 year old female Height:      65 inches Weight:      179 pounds Pulse rate:   81 / minute BP sitting:   125 / 79  (left arm) Cuff size:   large  Vitals Entered By: Burnett Kanaris, CNA (November 27, 2009 12:44 PM)  Physical Exam  Additional Exam:  Gen. Well-nourished, in no distress   Neck: JVD 2 cm above the clavicle, thyroid not enlarged, no carotid bruits Lungs: No tachypnea, clear without rales, rhonchi or wheezes Cardiovascular: Rhythm regular, PMI not displaced,  heart sounds  normal, no murmurs or gallops, trace bilateral peripheral edema, pulses normal in all 4 extremities. Abdomen: BS normal, abdomen soft and non-tender without masses or organomegaly, no hepatosplenomegaly. MS: No deformities, no cyanosis or clubbing   Neuro:  No focal sns   Skin:  no lesions    PPM Specifications Following MD:  Everardo Beals. Juanda Chance, MD     PPM Vendor:  St Jude     PPM Model Number:  [id number]     PPM Serial Number:  [id number] PPM DOI:  04/15/2005     PPM Implanting MD:  Everardo Beals. Juanda Chance, MD  Lead 1    Location: RA     DOI: 04/10/1994  Model #: [id number]     Serial #: [id number]     Status: capped Lead 2    Location: RV     DOI: 04/10/1994     Model #: [id number]     Serial #: [id number]     Status: active  Magnet Response Rate:  BOL 98.6 ERI  86.3  Indications:  Sick sinus syndrome  Explantation Comments:  TTM's with [employer]  PPM Follow Up Remote Check?  No Battery Voltage:  2.74 V     Battery Est. Longevity:  2 years     Pacer Dependent:  Yes     Right Ventricle  Impedance: 740 ohms, Threshold: 1.0 V at 0.6 msec  Episodes Coumadin:  Yes Ventricular Pacing:  92%  Parameters Mode:  VVIR     Lower Rate Limit:  70     Upper Rate Limit:  110 Next Cardiology Appt Due:  05/09/2010 Tech Comments:  No parameter changes.   Device function normal.  Checked by industry.  ROV  6 months. Altha Harm, LPN  November 27, 2009 1:22 PM   Impression & Recommendations:  Problem # 1:  DIASTOLIC HEART FAILURE, CHRONIC (ICD-428.32) She has a long history of diastolic heart failure. She appears fairly well compensated today although she has mild JVD and trace edema. We will get a BMP from her primary care physician and compared to our last BMP and see if we need to adjust her Lasix dose. She had been on  80 mg twice a day by her own titration and 6 days at back to 80 mg in the morning and 40 mg in the afternoon. The following medications were removed from the medication list:    Warfarin Sodium 4 Mg Tabs (Warfarin sodium) ..... Use as directed by anticoagulation clinic Her updated medication list for this problem includes:    Furosemide 80 Mg Tabs (Furosemide) .Marland Kitchen... 1 tab one day then 1/2 of oa tab the next day alternating daily    Warfarin Sodium 2 Mg Tabs (Warfarin sodium) ..... Use as directed by anticoagualtion clinic    Lotensin 40 Mg Tabs (Benazepril hcl) .Marland Kitchen... Take 1 tablet by mouth once a day    Nitroglycerin 0.4 Mg Subl (Nitroglycerin) .Marland Kitchen... Place 1 tablet under tongue as directed    Metoprolol Tartrate 25 Mg Tabs (Metoprolol tartrate) .Marland Kitchen... Take one tablet by mouth daily  Problem # 2:  PACEMAKER (ICD-V45.Marland Kitchen01) We interrogated her pacemaker today. She is programmed VVI and has underlying atrial fibrillation and has had previous AV nodal ablation.  Problem # 3:  ATRIAL FIBRILLATION (ICD-427.31) She has chronic atrial fibrillation and has had AV nodal ablation. This, they're stable. The following medications were removed from the medication list:    Warfarin Sodium 4 Mg Tabs (Warfarin sodium) ..... Use as directed by anticoagulation clinic Her updated medication list for this problem includes:    Warfarin Sodium 2 Mg Tabs (Warfarin sodium) ..... Use as directed by anticoagualtion clinic    Metoprolol Tartrate 25 Mg Tabs  (Metoprolol tartrate) .Marland Kitchen... Take one tablet by mouth daily  Orders: EKG w/ Interpretation (93000)  Patient Instructions: 1)  Your physician wants you to follow-up in: 6 months.  You will receive a reminder letter in the mail two months in advance. If you don't receive a letter, please call our office to schedule the follow-up appointment.

## 2010-10-10 NOTE — Medication Information (Signed)
Summary: Carrie [name]  Anticoagulant Therapy  Managed by: Cloyde Reams, RN, BSN Referring MD: Charlies Constable MD PCP: Lenox Ahr Supervising MD: Mariah Milling Indication 1: Atrial Fibrillation (ICD-427.31) Indication 2: HTN (ICD-401.1) Lab Used: [facility] Site: [place] INR POC 3.7 INR RANGE 2.0-3.0  Dietary changes: no    Health status changes: no    Bleeding/hemorrhagic complications: no    Recent/future hospitalizations: no    Any changes in medication regimen? no    Recent/future dental: no  Any missed doses?: no       Is patient compliant with meds? yes       Allergies: 1)  ! Tramadol Hcl (Tramadol Hcl) 2)  Demerol (Meperidine Hcl) 3)  Sulfamethoxazole (Sulfamethoxazole)  Anticoagulation Management History:      The patient is taking warfarin and comes in today for a routine follow up visit.  Positive risk factors for bleeding include an age of 6 years or older.  The bleeding index is 'intermediate risk'.  Positive CHADS2 values include History of CHF, History of HTN, and Age > 75 years old.  The start date was 12/26/1997.  Her last INR was 1.8.  Anticoagulation responsible provider: [place].  INR POC: 3.7.  Cuvette Lot#: [id number].  Exp: 07/2011.    Anticoagulation Management Assessment/Plan:      The patient's current anticoagulation dose is Warfarin sodium 2 mg tabs: Use as directed by Anticoagualtion Clinic.  The target INR is 2 - 3.  The next INR is due 07/17/2010.  Anticoagulation instructions were given to patient.  Results were reviewed/authorized by Cloyde Reams, RN, BSN.  She was notified by Cloyde Reams RN.         Prior Anticoagulation Instructions: INR 2.6  Continue on same dosage 2mg  daily.  Recheck in 4 weeks.      Current Anticoagulation Instructions: INR 3.7  Skip tomorrow's dosage of Coumadin, then start taking 2mg  daily except 1mg  on Mondays.  Recheck in 2 weeks.

## 2010-10-10 NOTE — Assessment & Plan Note (Signed)
Summary: swollen foot, feel warm/per dr. Alphonsus Sias   Vital Signs:  Patient profile:   77 year old female Weight:      182 pounds Temp:     98.7 degrees F oral Pulse rate:   77 / minute Pulse rhythm:   regular BP sitting:   144 / 92  (left arm) Cuff size:   large  Vitals Entered By: Mervin Hack CMA Duncan Dull) (September 20, 2010 12:53 PM) CC: swollen feet   History of Present Illness: Having swelling in feet and legs Having lots in pain making it hard to get around Feet in particular are painful  Goes back about 4 days Initially felt cramping, then progressed Has been taking usual furosemide---hasn't increased  No chest pain no SOB  No fever  Allergies: 1)  ! Tramadol Hcl (Tramadol Hcl) 2)  Demerol (Meperidine Hcl) 3)  Sulfamethoxazole (Sulfamethoxazole)  Past History:  Past medical, surgical, family and social histories (including risk factors) reviewed for relevance to current acute and chronic problems.  Past Medical History: 1. Chronic atrial fibrillation status post atrioventricular nodal     ablation. 2. Status post St. Jude Victory Pacemaker programmed to ventricular     demand pacing. 3. Diastolic heart failure with mild volume overload and increased     symptoms of shortness of breath. 4. Lumbar spine disease. 5. Degenerative joint disease.  6. HTN 7. Recurrent UTIs-------  Dr Artis Flock 8. Chronic sleep disturbance 9. Osteoporosis 10. Neuropathy 11.Gout  Past Surgical History: Reviewed history from 06/25/2010 and no changes required. Atrioventricular nodal    ablation.  St. Jude Victory Pacemaker programmed to ventricular   demand pacing.  Right knee surgery Placement of internal pulse generator for permanent spinal cord   stimulator left hip. Hysterectomy Appendectomy Breast biopsy  Family History: Reviewed history from 06/25/2010 and no changes required.   Positive in that she has a mother who died of heart failure and a sister who has heart  failure.  She also has a father who died of a stroke. 1 sister alive 59 others deceased Cancer in 2 (breast and pancreatic), 3 in accidents, strokes and vascular disease but they were all of advanced age  Social History: Reviewed history from 06/25/2010 and no changes required. Was homemaker Widow/Widower 1 daughter in [place], son in [place] Former Smoker--quit long ago Alcohol use-yes  Son Rosanne Ashing hold health care power of attorney. Does request DNR. No tube feeds if cognitively unaware  Review of Systems       Voiding fairly normally--may be somewhat decreased volume No trauma to feet  Physical Exam  General:  alert.  NAD Msk:  1st MTP red, warm  and tender bilat Extremities:  1+ edema in left leg and both feet (though more in left) Homans' absent bilaterally some tenderness along left ankle with pushing Mild venous dilation superficially Skin:  no ulcerations.   No obvious cellulitis   Impression & Recommendations:  Problem # 1:  GOUT (ICD-274.9) Assessment New  had this back 20 years ago but not since then clearly seems to be the reason for the increased pain  will try colcrys and recheck next week  Her updated medication list for this problem includes:    Colcrys 0.6 Mg Tabs (Colchicine) .Marland Kitchen... 1 tab by mouth two times a day as needed for gout pain  Problem # 2:  VENOUS INSUFFICIENCY (ICD-459.81) Assessment: Deteriorated will have her increase the furosemide briefly  Complete Medication List: 1)  Furosemide 80 Mg Tabs (Furosemide) .... 1/2  tablet two times a day 2)  Warfarin Sodium 2 Mg Tabs (Warfarin sodium) .... Use as directed by anticoagualtion clinic 3)  Lotensin 40 Mg Tabs (Benazepril hcl) .... Take 1 tablet by mouth once a day 4)  Metoprolol Tartrate 25 Mg Tabs (Metoprolol tartrate) .... Take one tablet by mouth daily 5)  Potassium Chloride Crys Cr 20 Meq Cr-tabs (Potassium chloride crys cr) .... Take one tablet by mouth daily 6)  Nitroglycerin 0.4 Mg Subl  (Nitroglycerin) .... Place 1 tablet under tongue as directed 7)  Gabapentin 100 Mg Caps (Gabapentin) .Marland Kitchen.. 1-2  caps  at bedtime as needed for spells of worsened foot pain 8)  Gabapentin 300 Mg Caps (Gabapentin) .Marland Kitchen.. 1 cap at bedtime for foot pain 9)  Trazodone Hcl 50 Mg Tabs (Trazodone hcl) .... Start with 1/2 tab for 4 days and if still not sleeping great, increase to a full tab 10)  Vitamin D 1000 Unit Tabs (Cholecalciferol) .... Take 1 tablet by mouth once a day 11)  Vitamin B-12 1000 Mcg Tabs (Cyanocobalamin) .... Take 1 by mouth once daily 12)  Colcrys 0.6 Mg Tabs (Colchicine) .Marland Kitchen.. 1 tab by mouth two times a day as needed for gout pain  Patient Instructions: 1)  Please use the colcrys up to two times a day for the gout pain. 2)  Please take the furosemide at 80mg  in AM and 40 later for the next few days till the swelling is better 3)  Please schedule a follow-up appointment next week Prescriptions: COLCRYS 0.6 MG TABS (COLCHICINE) 1 tab by mouth two times a day as needed for gout pain  #60 x 1   Entered and Authorized by:   Cindee Salt MD   Signed by:   Cindee Salt MD on 09/20/2010   Method used:   Electronically to        [facility]* (retail)       [address]       [place], [place]  [address]       Ph: [phone]       Fax: [phone]   RxID:   [id number]    Orders Added: 1)  Est. Patient Level IV [40102]    Current Allergies (reviewed today): ! TRAMADOL HCL (TRAMADOL HCL) DEMEROL (MEPERIDINE HCL) SULFAMETHOXAZOLE (SULFAMETHOXAZOLE)

## 2010-10-10 NOTE — Progress Notes (Signed)
Summary: Not feeling well  Phone Note Call from Patient Call back at Home Phone [phone]   Caller: Patient Call For: Cindee Salt MD Summary of Call: pt states she has not been feeling well since starting the "pain med" Tramadol, her daughter seems to think when you say pain med it triggers in her mothers brain to be sick? Pt states she doesn't need anything for pain, she needs something to help her rest, she would like to try the other medication you talked about. Initial call taken by: Mervin Hack CMA Duncan Dull),  June 27, 2010 3:47 PM  Follow-up for Phone Call        Please have her stop the tramadol  Have her try trazodone 50mg  for sleep start with 1/2 tab for 4 days and if still not sleeping great, increase to a full tab #30 x 3 Follow-up by: Cindee Salt MD,  June 27, 2010 4:39 PM  Additional Follow-up for Phone Call Additional follow up Details #1::        spoke with patient and advised results, she will call on Monday if she's still feeling sick. Additional Follow-up by: Mervin Hack CMA Duncan Dull),  June 28, 2010 8:41 AM   New Allergies: ! TRAMADOL HCL (TRAMADOL HCL) New/Updated Medications: TRAZODONE HCL 50 MG TABS (TRAZODONE HCL) start with 1/2 tab for 4 days and if still not sleeping great, increase to a full tab New Allergies: ! TRAMADOL HCL (TRAMADOL HCL)Prescriptions: TRAZODONE HCL 50 MG TABS (TRAZODONE HCL) start with 1/2 tab for 4 days and if still not sleeping great, increase to a full tab  #30 x 3   Entered by:   Mervin Hack CMA (AAMA)   Authorized by:   Cindee Salt MD   Signed by:   Mervin Hack CMA (AAMA) on 06/28/2010   Method used:   Electronically to        [facility]* (retail)       [address]       [place], [place]  [address]       Ph: [phone]       Fax: [phone]   RxID:   [id number]   Prior Medications: FUROSEMIDE 80 MG TABS (FUROSEMIDE) 1/2 tablet two times a  day WARFARIN SODIUM 2 MG TABS (WARFARIN SODIUM) Use as directed by Anticoagualtion Clinic LOTENSIN 40 MG TABS (BENAZEPRIL HCL) Take 1 tablet by mouth once a day NITROGLYCERIN 0.4 MG SUBL (NITROGLYCERIN) Place 1 tablet under tongue as directed METOPROLOL TARTRATE 25 MG TABS (METOPROLOL TARTRATE) Take one tablet by mouth daily POTASSIUM CHLORIDE CRYS CR 20 MEQ CR-TABS (POTASSIUM CHLORIDE CRYS CR) Take one tablet by mouth daily VITAMIN D 1000 UNIT TABS (CHOLECALCIFEROL) Take 1 tablet by mouth once a day VITAMIN B-12 1000 MCG TABS (CYANOCOBALAMIN) take 1 by mouth once daily Current Allergies: ! TRAMADOL HCL (TRAMADOL HCL) DEMEROL (MEPERIDINE HCL) SULFAMETHOXAZOLE (SULFAMETHOXAZOLE)

## 2010-10-10 NOTE — Assessment & Plan Note (Signed)
Summary: F6M/AFIB   Visit Type:  Follow-up Primary Provider:  Dr.Gilbert  CC:  atrial-fib.  History of Present Illness: This is an 77 year old white female patient who has a history of diastolic heart failure, chronic atrial fibrillation and has had AV nodal ablation with VVI pacer in place.  The patient was admitted to [facility] with pneumonia back in May. Her Lasix was changed to 40 mg daily at that point. Since then she's had a lot of trouble with fluid overload dyspnea on exertion orthopnea and weight gain. She also had been [place] a friend at the beach and had gone on TEE. At the same time she had read pharmacy info sheet on metoprolol but talked about heart failure so she stopped this drug. She knew she had extra fluid so she went back up to her usual dose of Lasix which was 80 mg in the morning and 40 mg in the evening. She has lost 5 pounds according to her scales and she feels much better. She has been able to sleep the past 3 nights without getting up short of breath. Her ankle edema has also gone. She is also resumed her metoprolol 25 mg once a day but not twice a day as previously prescribed.  She currently denies chest pain palpitations dyspnea dyspnea on exertion orthopnea dizziness or presyncope.  Current Medications (verified): 1)  Furosemide 80 Mg Tabs (Furosemide) .Marland Kitchen.. 1 Tab One Day Then 1/2 of Oa Tab The Next Day Alternating Daily 2)  Warfarin Sodium 2 Mg Tabs (Warfarin Sodium) .... Use As Directed By Anticoagualtion Clinic 3)  Lotensin 40 Mg Tabs (Benazepril Hcl) .... Take 1 Tablet By Mouth Once A Day 4)  Nitroglycerin 0.4 Mg Subl (Nitroglycerin) .... Place 1 Tablet Under Tongue As Directed 5)  Metoprolol Tartrate 25 Mg Tabs (Metoprolol Tartrate) .... Take One Tablet By Mouth Daily 6)  Potassium Chloride Crys Cr 20 Meq Cr-Tabs (Potassium Chloride Crys Cr) .... Take One Tablet By Mouth Daily  Allergies: 1)  Demerol (Meperidine Hcl) 2)  Sulfamethoxazole  (Sulfamethoxazole)  Past History:  Past Medical History: Last updated: 10/25/2008 1. Chronic atrial fibrillation status post atrioventricular nodal     ablation. 2. Status post St. Jude Victory Pacemaker programmed to ventricular     demand pacing. 3. Diastolic heart failure with mild volume overload and increased     symptoms of shortness of breath. 4. Lumbar spine disease. 5. Degenerative joint disease.   Review of Systems       see the history of present illness  Vital Signs:  Patient profile:   77 year old female Height:      65 inches Weight:      176.25 pounds BMI:     29.44 Pulse rate:   70 / minute Pulse rhythm:   irregular Resp:     18 per minute BP sitting:   110 / 84  (left arm) Cuff size:   large  Vitals Entered By: Vikki Ports (April 09, 2010 12:30 PM)  Physical Exam  General:   Well-nournished, in no acute distress. Neck: No JVD, HJR, Bruit, or thyroid enlargement Lungs: No tachypnea, clear without wheezing, rales, or rhonchi Cardiovascular: RRR, PMI not displaced, heart sounds normal, no murmurs, gallops, bruit, thrill, or heave. Abdomen: BS normal. Soft without organomegaly, masses, lesions or tenderness. Extremities: venous stasis changes in bilateral lower extremities otherwisewithout , clubbing or edema. Good distal pulses bilateral SKin: Warm, no lesions or rashes  Musculoskeletal: No deformities Neuro: no focal signs  EKG  Procedure date:  04/09/2010  Findings:      ventricular paced  PPM Specifications Following MD:  Everardo Beals. Juanda Chance, MD     PPM Vendor:  St Jude     PPM Model Number:  [phone]     PPM Serial Number:  [id number] PPM DOI:  04/15/2005     PPM Implanting MD:  Everardo Beals. Juanda Chance, MD  Lead 1    Location: RA     DOI: 04/10/1994     Model #: [id number]     Serial #: [id number]     Status: capped Lead 2    Location: RV     DOI: 04/10/1994     Model #: [id number]     Serial #: [id number]     Status: active  Magnet Response Rate:  BOL 98.6 ERI   86.3  Indications:  Sick sinus syndrome  Explantation Comments:  TTM's with [employer]  PPM Follow Up Pacer Dependent:  Yes      Episodes Coumadin:  Yes  Parameters Mode:  VVIR     Lower Rate Limit:  70     Upper Rate Limit:  110  Impression & Recommendations:  Problem # 1:  DIASTOLIC HEART FAILURE, CHRONIC (ICD-428.32) Patient had recent acute on chronic diastolic heart failure which has resolved. We will continue the current dose of Lasix 80 mg in the morning 40 in the evening. I've talked her about 2 g sodium diet and given her one. I also asked her to call us if she has a 2 or 3 pound weight gain over night. We will E. of the metoprolol dose at once a day. I asked her to call us if she has any palpitations and we may have to go back up on twice a day. Her updated medication list for this problem includes:    Furosemide 80 Mg Tabs (Furosemide) .Marland Kitchen... Take one tablet in am 1/2 tablet in pm    Warfarin Sodium 2 Mg Tabs (Warfarin sodium) ..... Use as directed by anticoagualtion clinic    Lotensin 40 Mg Tabs (Benazepril hcl) .Marland Kitchen... Take 1 tablet by mouth once a day    Nitroglycerin 0.4 Mg Subl (Nitroglycerin) .Marland Kitchen... Place 1 tablet under tongue as directed    Metoprolol Tartrate 25 Mg Tabs (Metoprolol tartrate) .Marland Kitchen... Take one tablet by mouth daily  Orders: EKG w/ Interpretation (93000)  Problem # 2:  ATRIAL FIBRILLATION (ICD-427.31) Currently patient is ventricular paced. Continue metoprolol 25 mg once daily Her updated medication list for this problem includes:    Warfarin Sodium 2 Mg Tabs (Warfarin sodium) ..... Use as directed by anticoagualtion clinic    Metoprolol Tartrate 25 Mg Tabs (Metoprolol tartrate) .Marland Kitchen... Take one tablet by mouth daily  Orders: EKG w/ Interpretation (93000)  Patient Instructions: 1)  Your physician recommends that you schedule a follow-up appointment in: Next month With Dr. Juanda Chance. 2)  Your physician has requested that you limit the intake of sodium (salt) in  your diet to two grams daily. Please see [facility] handout.

## 2010-10-10 NOTE — Progress Notes (Signed)
Summary: CALL REPORT  Phone Note Call from Patient Call back at Home Phone [phone]   Caller: Selena Batten with [facility] ultrasound Call For: Cindee Salt MD Summary of Call: CALL REPORT- Left leg doppler neg for DVT, but there is a complex fluid collection at the popliteal fossa measuring 3.8 x 2.1 x .81 cm. Please adivse.  Initial call taken by: Melody Comas,  September 23, 2010 4:30 PM  Follow-up for Phone Call        Please call her No DVT SOunds like she may have a cyst on her knee that could be causing more fluid in the leg Please try the increased furosemide and keep the follow up appt Follow-up by: Cindee Salt MD,  September 23, 2010 5:04 PM  Additional Follow-up for Phone Call Additional follow up Details #1::        Spoke with patient and advised results.  Additional Follow-up by: Mervin Hack CMA Duncan Dull),  September 23, 2010 5:19 PM

## 2010-10-10 NOTE — Medication Information (Signed)
Summary: CCR/AMD  Anticoagulant Therapy  Managed by: Cloyde Reams, RN, BSN Referring MD: Charlies Constable MD PCP: Early Chars MD: Ladona Ridgel MD, Sharlot Gowda Indication 1: Atrial Fibrillation (ICD-427.31) Indication 2: HTN (ICD-401.1) Lab Used: [facility]--[place] Site: [place] INR POC 2.8 INR RANGE 2.0-3.0  Dietary changes: no    Health status changes: no    Bleeding/hemorrhagic complications: no    Recent/future hospitalizations: no    Any changes in medication regimen? no    Recent/future dental: no  Any missed doses?: no       Is patient compliant with meds? yes       Allergies: 1)  Demerol (Meperidine Hcl) 2)  Sulfamethoxazole (Sulfamethoxazole)  Anticoagulation Management History:      The patient is taking warfarin and comes in today for a routine follow up visit.  Positive risk factors for bleeding include an age of 70 years or older.  The bleeding index is 'intermediate risk'.  Positive CHADS2 values include History of CHF and Age > 27 years old.  The start date was 12/26/1997.  Her last INR was 1.8.  Anticoagulation responsible provider: Ladona Ridgel MD, Sharlot Gowda.  INR POC: 2.8.  Cuvette Lot#: [id number].  Exp: 05/2011.    Anticoagulation Management Assessment/Plan:      The patient's current anticoagulation dose is Warfarin sodium 2 mg tabs: Use as directed by Anticoagualtion Clinic.  The target INR is 2 - 3.  The next INR is due 04/17/2010.  Anticoagulation instructions were given to patient.  Results were reviewed/authorized by Cloyde Reams, RN, BSN.  She was notified by Cloyde Reams RN.         Prior Anticoagulation Instructions: INR 2.5  Continue on same dosage 2mg  daily.  Recheck in 3 weeks.    Current Anticoagulation Instructions: INR 2.8  Continue on same dosage 2mg  daily.  Recheck in 4 weeks.

## 2010-10-10 NOTE — Progress Notes (Signed)
Summary: talk to nurse/**LM/nm  Phone Note From Other Clinic Call back at [facility] Phone [phone]   Caller: nurse Angelica Chessman Summary of Call: Per Angelica Chessman Independent living nurse Pt stopped her metoprolo on her own due to the side effects that she read that the pharamacy gave her with her refill. Please call pt to discuss this with her.  Initial call taken by: Edman Circle,  March 26, 2010 10:54 AM  Follow-up for Phone Call        [facility]. Ollen Gross, RN, BSN  March 26, 2010 11:02 AM Pt returning call Judie Grieve  March 26, 2010 11:40 AM Spoke with pt who reports she stopped metoprolol tartrate 25 mg by mouth daily on March 22, 2010 after reading leaflet from pharmacy saying that metoprolol could make CHF worse.  She has been taking metoprolol for about 2 years.  Pt reports she had increased swelling, shortness of breath and wt gain a couple of weeks ago.  She took increased dose of Lasix at that time and is feeling better and wt. is back to normal.  Pt stated nurse at independent living center instructed her to resume metoprolol so she restarted it today.  I told pt I agreed with this plan and that she should be sure to discuss with PA at appt. on April 09, 2010. Follow-up by: Dossie Arbour, RN, BSN,  March 26, 2010 12:07 PM

## 2010-10-11 DIAGNOSIS — I4891 Unspecified atrial fibrillation: Secondary | ICD-10-CM

## 2010-10-11 DIAGNOSIS — Z9181 History of falling: Secondary | ICD-10-CM

## 2010-10-11 DIAGNOSIS — Z950 Presence of cardiac pacemaker: Secondary | ICD-10-CM

## 2010-10-11 DIAGNOSIS — R609 Edema, unspecified: Secondary | ICD-10-CM

## 2010-10-19 ENCOUNTER — Encounter: Payer: Self-pay | Admitting: Internal Medicine

## 2010-10-23 DIAGNOSIS — Z7902 Long term (current) use of antithrombotics/antiplatelets: Secondary | ICD-10-CM

## 2010-10-30 ENCOUNTER — Encounter (INDEPENDENT_AMBULATORY_CARE_PROVIDER_SITE_OTHER): Payer: Medicare Other

## 2010-10-30 ENCOUNTER — Encounter: Payer: Self-pay | Admitting: Cardiovascular Disease

## 2010-10-30 DIAGNOSIS — Z7901 Long term (current) use of anticoagulants: Secondary | ICD-10-CM

## 2010-10-30 DIAGNOSIS — I4891 Unspecified atrial fibrillation: Secondary | ICD-10-CM

## 2010-11-01 ENCOUNTER — Ambulatory Visit: Payer: Medicare Other | Admitting: Internal Medicine

## 2010-11-05 ENCOUNTER — Ambulatory Visit (INDEPENDENT_AMBULATORY_CARE_PROVIDER_SITE_OTHER): Payer: Medicare Other | Admitting: Internal Medicine

## 2010-11-05 ENCOUNTER — Encounter: Payer: Self-pay | Admitting: Internal Medicine

## 2010-11-05 DIAGNOSIS — G589 Mononeuropathy, unspecified: Secondary | ICD-10-CM

## 2010-11-05 DIAGNOSIS — S20219A Contusion of unspecified front wall of thorax, initial encounter: Secondary | ICD-10-CM

## 2010-11-05 DIAGNOSIS — M109 Gout, unspecified: Secondary | ICD-10-CM

## 2010-11-05 DIAGNOSIS — I10 Essential (primary) hypertension: Secondary | ICD-10-CM

## 2010-11-05 NOTE — Medication Information (Signed)
Summary: ROV/ MES  Anticoagulant Therapy  Managed by: Cloyde Reams, RN, BSN Referring MD: Charlies Constable MD PCP: Dr. Pam Drown MD: Mariah Milling Indication 1: Atrial Fibrillation (ICD-427.31) Indication 2: HTN (ICD-401.1) Lab Used: [facility]--[place] Site: [place] INR POC 2.9 INR RANGE 2.0-3.0  Dietary changes: no    Health status changes: yes       Details: Had a fall, went to rehab x 4 weeks.  Discharged 1 week ago.   Bleeding/hemorrhagic complications: no    Recent/future hospitalizations: no    Any changes in medication regimen? no    Recent/future dental: no  Any missed doses?: no       Is patient compliant with meds? yes       Allergies: 1)  ! Tramadol Hcl (Tramadol Hcl) 2)  Demerol (Meperidine Hcl) 3)  Sulfamethoxazole (Sulfamethoxazole)  Anticoagulation Management History:      The patient is taking warfarin and comes in today for a routine follow up visit.  Positive risk factors for bleeding include an age of 75 years or older.  The bleeding index is 'intermediate risk'.  Positive CHADS2 values include History of CHF, History of HTN, and Age > 21 years old.  The start date was 12/26/1997.  Her last INR was 1.8.  Anticoagulation responsible provider: gollan.  INR POC: 2.9.  Cuvette Lot#: [id number].  Exp: 09/2011.    Anticoagulation Management Assessment/Plan:      The patient's current anticoagulation dose is Warfarin sodium 2 mg tabs: Use as directed by Anticoagualtion Clinic.  The target INR is 2 - 3.  The next INR is due 11/27/2010.  Anticoagulation instructions were given to patient.  Results were reviewed/authorized by Cloyde Reams, RN, BSN.  She was notified by Cloyde Reams RN.         Prior Anticoagulation Instructions: INR 2.6  Continue same dosage 2mg  once daily. Recheck in 4 weeks.  Current Anticoagulation Instructions: INR 2.9  Continue on same dosage 2mg  daily.  Recheck in 4 weeks.

## 2010-11-06 ENCOUNTER — Ambulatory Visit: Payer: Medicare Other | Admitting: Internal Medicine

## 2010-11-11 ENCOUNTER — Encounter: Payer: Self-pay | Admitting: Internal Medicine

## 2010-11-11 DIAGNOSIS — I495 Sick sinus syndrome: Secondary | ICD-10-CM

## 2010-11-14 ENCOUNTER — Telehealth: Payer: Self-pay | Admitting: Family Medicine

## 2010-11-14 NOTE — Assessment & Plan Note (Signed)
Summary: 1 MTH FU/RBH   Vital Signs:  Patient profile:   77 year old female Height:      62 inches Weight:      170 pounds BMI:     31.21 Temp:     98.2 degrees F oral Pulse rate:   70 / minute Pulse rhythm:   regular BP sitting:   127 / 78  (right arm) Cuff size:   large  Vitals Entered By: Mervin Hack CMA Duncan Dull) (November 05, 2010 11:42 AM) CC: one month follow up   History of Present Illness: DOing some better Still having right rib pain with breathing--radiates to back Is improving  Was in health care for 4 weeks due to debility Now back in her place for 2 weeks almost daughter staying with her Seems ready to be on her own Daughter satisfied she can be independent in home  sTill getting PT 3 days per week  Not using the pain pills had some nausea  using trazodone for sleep but not helping much doesn't seem to be getting up due to pain though--just awakens  Still with some foot swelling some pain has continued the gout tab once a day   Allergies: 1)  ! Tramadol Hcl (Tramadol Hcl) 2)  Demerol (Meperidine Hcl) 3)  Sulfamethoxazole (Sulfamethoxazole)  Past History:  Past medical, surgical, family and social histories (including risk factors) reviewed for relevance to current acute and chronic problems.  Past Medical History: Reviewed history from 09/20/2010 and no changes required. 1. Chronic atrial fibrillation status post atrioventricular nodal     ablation. 2. Status post St. Jude Victory Pacemaker programmed to ventricular     demand pacing. 3. Diastolic heart failure with mild volume overload and increased     symptoms of shortness of breath. 4. Lumbar spine disease. 5. Degenerative joint disease.  6. HTN 7. Recurrent UTIs-------  Dr Artis Flock 8. Chronic sleep disturbance 9. Osteoporosis 10. Neuropathy 11.Gout  Past Surgical History: Reviewed history from 06/25/2010 and no changes required. Atrioventricular nodal    ablation.  St. Jude  Victory Pacemaker programmed to ventricular   demand pacing.  Right knee surgery Placement of internal pulse generator for permanent spinal cord   stimulator left hip. Hysterectomy Appendectomy Breast biopsy  Family History: Reviewed history from 06/25/2010 and no changes required.   Positive in that she has a mother who died of heart failure and a sister who has heart failure.  She also has a father who died of a stroke. 1 sister alive 18 others deceased Cancer in 2 (breast and pancreatic), 3 in accidents, strokes and vascular disease but they were all of advanced age  Social History: Reviewed history from 06/25/2010 and no changes required. Was homemaker Widow/Widower 1 daughter in [place], son in [place] Former Smoker--quit long ago Alcohol use-yes  Son Rosanne Ashing hold health care power of attorney. Does request DNR. No tube feeds if cognitively unaware  Review of Systems       still on the gabapentin at night for neuropathy pain appetite is fair  Physical Exam  General:  normal appearance.   Neck:  supple, no masses, and no cervical lymphadenopathy.   Chest Wall:  no sig tenderness Lungs:  normal respiratory effort, no intercostal retractions, no accessory muscle use, and normal breath sounds.   Heart:  normal rate, regular rhythm, no murmur, and no gallop.   Msk:  No signs of gout in feet Extremities:  1+ edema in legs---R>L Psych:  normally interactive and good  eye contact.     Impression & Recommendations:  Problem # 1:  CONTUSION OF CHEST WALL (ICD-922.1) Assessment Improved better functional status daughter will be leaving and she can be alone again  Problem # 2:  NEUROPATHY (ICD-355.9) Assessment: Deteriorated just can't sleep Not from the chest pain will increase gabapentin and trazodone  Problem # 3:  GOUT (ICD-274.9) Assessment: Improved seems quiet will use as needed only  Her updated medication list for this problem includes:    Colcrys 0.6 Mg Tabs  (Colchicine) .Marland Kitchen... 1 tab by mouth two times a day as needed for gout pain  Problem # 4:  ESSENTIAL HYPERTENSION (ICD-401.9) Assessment: Unchanged BP is okay  Her updated medication list for this problem includes:    Furosemide 80 Mg Tabs (Furosemide) .Marland Kitchen... 1/2 tablet two times a day    Lotensin 40 Mg Tabs (Benazepril hcl) .Marland Kitchen... Take 1 tablet by mouth once a day    Metoprolol Tartrate 25 Mg Tabs (Metoprolol tartrate) .Marland Kitchen... Take one tablet by mouth daily  BP today: 127/78 Prior BP: 123/72 (09/27/2010)  10 Yr Risk Heart Disease: Not enough information  Labs Reviewed: K+: 4.2 (09/23/2010) Creat: : 1.1 (09/23/2010)     Complete Medication List: 1)  Furosemide 80 Mg Tabs (Furosemide) .... 1/2 tablet two times a day 2)  Warfarin Sodium 2 Mg Tabs (Warfarin sodium) .... Use as directed by anticoagualtion clinic 3)  Lotensin 40 Mg Tabs (Benazepril hcl) .... Take 1 tablet by mouth once a day 4)  Metoprolol Tartrate 25 Mg Tabs (Metoprolol tartrate) .... Take one tablet by mouth daily 5)  Potassium Chloride Crys Cr 20 Meq Cr-tabs (Potassium chloride crys cr) .... Take one tablet by mouth daily 6)  Nitroglycerin 0.4 Mg Subl (Nitroglycerin) .... Place 1 tablet under tongue as directed 7)  Gabapentin 300 Mg Caps (Gabapentin) .... 2 caps  at bedtime for foot pain 8)  Trazodone Hcl 50 Mg Tabs (Trazodone hcl) .... Take 2 tablets at bedtime to help sleep 9)  Vitamin D 1000 Unit Tabs (Cholecalciferol) .... Take 1 tablet by mouth once a day 10)  Vitamin B-12 1000 Mcg Tabs (Cyanocobalamin) .... Take 1 by mouth once daily 11)  Colcrys 0.6 Mg Tabs (Colchicine) .Marland Kitchen.. 1 tab by mouth two times a day as needed for gout pain 12)  Tramadol Hcl 50 Mg Tabs (Tramadol hcl) .... 1/2-1 tab by mouth three times a day as needed for pain  Patient Instructions: 1)  Please use the colchicine (colcrys) only if the gout flares 2)  Increase the gabapentin to 600mg  at bedtime and the trazodone to 100mg  at bedtime. Call if any  apparent side effects 3)  Please schedule a follow-up appointment in 6 weeks.  Prescriptions: GABAPENTIN 300 MG CAPS (GABAPENTIN) 2 caps  at bedtime for foot pain  #60 x 3   Entered and Authorized by:   Cindee Salt MD   Signed by:   Cindee Salt MD on 11/05/2010   Method used:   Electronically to        [facility]* (retail)       [address]       [place], [place]  [address]       Ph: [phone]       Fax: [phone]   RxID:   [id number] TRAZODONE HCL 50 MG TABS (TRAZODONE HCL) take 2 tablets at bedtime to help sleep  #60 x 3   Entered and Authorized by:  Cindee Salt MD   Signed by:   Cindee Salt MD on 11/05/2010   Method used:   Electronically to        [facility]* (retail)       [address]       [place], [place]  [address]       Ph: [phone]       Fax: [phone]   RxID:   [id number]    Orders Added: 1)  Est. Patient Level IV [84132]    Current Allergies (reviewed today): ! TRAMADOL HCL (TRAMADOL HCL) DEMEROL (MEPERIDINE HCL) SULFAMETHOXAZOLE (SULFAMETHOXAZOLE)

## 2010-11-19 NOTE — Progress Notes (Signed)
Summary: requests xanax  Phone Note Call from Patient Call back at Home Phone [phone]   Caller: Patient Summary of Call: Pt was given colcrys for gout while in rehab and when she left Dr. Alphonsus Sias told her to stop taking it.  She is now having another flare up and is asking how she should start taking again.  I advised her one twice a day as needed for flare up.  Also, she was given xananx .25 mg's while in rehab, but not given a script when she left.  She is asking if she can have a new script called to [facility]. Initial call taken by: Lowella Petties CMA, AAMA,  November 14, 2010 10:56 AM  Follow-up for Phone Call        I would suspect Dr Alphonsus Sias would rather she not take Xanax. I will give her a small amt to take as needed. If she needs more, she should discuss this with Dr Alphonsus Sias. Colcrys two times a day as needed, stop after pain gone. Follow-up by: Shaune Leeks MD,  November 14, 2010 11:49 AM  Additional Follow-up for Phone Call Additional follow up Details #1::        Advised pt, medicine called to pharmacy.          Lowella Petties CMA, AAMA  November 14, 2010 12:25 PM     New/Updated Medications: ALPRAZOLAM 0.25 MG TABS (ALPRAZOLAM) one tab by mouth daily sparingly as needed for anxiety. Prescriptions: ALPRAZOLAM 0.25 MG TABS (ALPRAZOLAM) one tab by mouth daily sparingly as needed for anxiety.  #30 x 0   Entered and Authorized by:   Shaune Leeks MD   Signed by:   Lowella Petties CMA, AAMA on 11/14/2010   Method used:   Telephoned to ...       [facility]* (retail)       [address]       [place], [place]  [address]       Ph: [phone]       Fax: [phone]   RxID:   [id number]

## 2010-11-21 ENCOUNTER — Telehealth: Payer: Self-pay | Admitting: Cardiovascular Disease

## 2010-11-23 ENCOUNTER — Encounter: Payer: Self-pay | Admitting: Cardiovascular Disease

## 2010-11-23 DIAGNOSIS — I4891 Unspecified atrial fibrillation: Secondary | ICD-10-CM

## 2010-11-23 DIAGNOSIS — Z7901 Long term (current) use of anticoagulants: Secondary | ICD-10-CM

## 2010-11-26 NOTE — Progress Notes (Signed)
Summary: RX  Phone Note Refill Request Call back at Home Phone [phone] Message from:  Patient on November 21, 2010 10:21 AM  Refills Requested: Medication #1:  WARFARIN SODIUM 2 MG TABS Use as directed by [facility]  Initial call taken by: Harlon Flor,  November 21, 2010 10:21 AM  Follow-up for Phone Call        Rx faxed to pharmacy Follow-up by: Bishop Dublin, CMA,  November 21, 2010 10:49 AM    Prescriptions: WARFARIN SODIUM 2 MG TABS (WARFARIN SODIUM) Use as directed by Anticoagualtion Clinic  #30 x 6   Entered by:   Bishop Dublin, CMA   Authorized by:   Dossie Arbour MD   Signed by:   Bishop Dublin, CMA on 11/21/2010   Method used:   Electronically to        [facility]* (retail)       [address]       [place], [place]  [address]       Ph: [phone]       Fax: [phone]   RxID:   [id number]

## 2010-11-27 ENCOUNTER — Ambulatory Visit (INDEPENDENT_AMBULATORY_CARE_PROVIDER_SITE_OTHER): Payer: Medicare Other | Admitting: Emergency Medicine

## 2010-11-27 DIAGNOSIS — Z7901 Long term (current) use of anticoagulants: Secondary | ICD-10-CM

## 2010-11-27 DIAGNOSIS — I4891 Unspecified atrial fibrillation: Secondary | ICD-10-CM

## 2010-11-27 LAB — POCT INR: INR: 1.9

## 2010-11-27 NOTE — Patient Instructions (Signed)
Take 4mg  today, then resume same dosage 2mg  daily.  Recheck in 4 weeks.

## 2010-11-29 ENCOUNTER — Telehealth: Payer: Self-pay | Admitting: *Deleted

## 2010-11-29 NOTE — Telephone Encounter (Signed)
Pt has gout flare up in right foot, took a colchicine about 6 this morning and is asking if she should start taking these.  I advised yes, she should take these twice a day while having a flare up.  Stop taking when she feels better.  She is coming in to see you in a couple of weeks and will discuss gout treatment with you then.

## 2010-11-29 NOTE — Telephone Encounter (Signed)
Okay this is correct

## 2010-12-10 ENCOUNTER — Ambulatory Visit: Payer: Self-pay | Admitting: Internal Medicine

## 2010-12-16 ENCOUNTER — Ambulatory Visit (INDEPENDENT_AMBULATORY_CARE_PROVIDER_SITE_OTHER): Payer: Medicare Other | Admitting: Internal Medicine

## 2010-12-16 ENCOUNTER — Encounter: Payer: Self-pay | Admitting: Internal Medicine

## 2010-12-16 ENCOUNTER — Telehealth: Payer: Self-pay | Admitting: *Deleted

## 2010-12-16 VITALS — BP 150/70 | HR 76 | Temp 99.5°F | Wt 177.0 lb

## 2010-12-16 DIAGNOSIS — M109 Gout, unspecified: Secondary | ICD-10-CM

## 2010-12-16 MED ORDER — PREDNISONE 20 MG PO TABS: 40.0000 mg | ORAL_TABLET | Freq: Every day | ORAL | Status: AC

## 2010-12-16 MED ORDER — COLCHICINE 0.6 MG PO TABS: 0.6000 mg | ORAL_TABLET | Freq: Two times a day (BID) | ORAL | Status: DC | PRN

## 2010-12-16 NOTE — Telephone Encounter (Signed)
Opened in error

## 2010-12-16 NOTE — Patient Instructions (Signed)
Please cancel Thursday's appointment Please start the prednisone tonight Take your first colcrys as soon as possible and take up to 2 a day till your foot settles down

## 2010-12-16 NOTE — Progress Notes (Signed)
  Subjective:    Patient ID: Carrie [name], female    DOB: [birth year], 77 y.o.   MRN: [id number]  HPI Left foot pain has been intermittent over the past few days Really bad yesterday Terrible pain along instep Hasn't tried the colcrys  Right foot has been worse in past Not as bad as left now but is still painful  Has had some low grade fever--by feel Sleeping more than usual today Hasn't taken any pain pills  Past Medical History  Diagnosis Date  . Atrial fibrillation   . Diastolic heart failure     mild volume overload and increased symptoms of shortness of breath  . DJD (degenerative joint disease) of lumbar spine   . Degenerative joint disease   . HTN (hypertension)   . History of recurrent UTIs     Dr. Artis Flock   . Sleep disturbance   . Neuropathy   . Gout     Past Surgical History  Procedure Date  . Ventricular ablation surgery     atrioventricular nodal ablation  . Ventricular cardiac pacemaker insertion     St. Jude Victory  Pacemaker programmed to ventricular demand pacing  . Knee surgery     right  . Appendectomy   . Breast biopsy   . Pulse generator implant     placement of internal pulse generator for permanent spinal cord- stimulartor left hip    Family History  Problem Relation Age of Onset  . Heart disease Mother     heart failure  . Stroke Father     History   Social History  . Marital Status: Married    Spouse Name: N/A    Number of Children: 2  . Years of Education: N/A   Occupational History  . homemaker    Social History Main Topics  . Smoking status: Former Games developer  . Smokeless tobacco: Not on file   Comment: quit long ago   . Alcohol Use: Yes  . Drug Use: Not on file  . Sexually Active: Not on file   Other Topics Concern  . Not on file   Social History Narrative   Son Rosanne Ashing holds health care power of attorney. Does request DNR. No tube feeds in cognitively unaware   Review of Systems Appetite is okay No new meds      Objective:   Physical Exam  Constitutional: She appears well-developed and well-nourished.       Mildly uncomfortable  Musculoskeletal:       Marked general swelling in left foot and ankle Redness along arch, 1st MTP and across tops of MTP joints RIght foot with minimal swelling          Assessment & Plan:

## 2010-12-17 LAB — COMPREHENSIVE METABOLIC PANEL
ALT: 10 U/L (ref 0–35)
AST: 25 U/L (ref 0–37)
CO2: 29 mEq/L (ref 19–32)
Chloride: 96 mEq/L (ref 96–112)
Creatinine, Ser: 0.9 mg/dL (ref 0.4–1.2)
GFR calc Af Amer: >60 mL/min (ref >60)
GFR calc non Af Amer: 59 mL/min — ABNORMAL LOW (ref >60)
Glucose, Bld: 121 mg/dL — ABNORMAL HIGH (ref 70–99)
Total Bilirubin: 0.7 mg/dL (ref 0.3–1.2)

## 2010-12-17 LAB — URINALYSIS, ROUTINE W REFLEX MICROSCOPIC
Bilirubin Urine: NEGATIVE
Glucose, UA: NEGATIVE mg/dL
Ketones, ur: NEGATIVE mg/dL
Protein, ur: NEGATIVE mg/dL
pH: 6.5 (ref 5.0–8.0)

## 2010-12-17 LAB — APTT: aPTT: 34 seconds (ref 24–37)

## 2010-12-17 LAB — BASIC METABOLIC PANEL
BUN: 17 mg/dL (ref 6–23)
CO2: 29 mEq/L (ref 19–32)
Calcium: 8.6 mg/dL (ref 8.4–10.5)
Creatinine, Ser: 0.87 mg/dL (ref 0.4–1.2)
GFR calc non Af Amer: >60 mL/min (ref >60)
GFR: 41.05 mL/min — ABNORMAL LOW (ref >60.00)
Glucose, Bld: 146 mg/dL — ABNORMAL HIGH (ref 70–99)
Potassium: 4.5 mEq/L (ref 3.5–5.1)
Sodium: 142 mEq/L (ref 135–145)

## 2010-12-17 LAB — CBC
HCT: 40.9 % (ref 36.0–46.0)
Hemoglobin: 13.8 g/dL (ref 12.0–15.0)
MCHC: 33.7 g/dL (ref 30.0–36.0)
MCV: 94.5 fL (ref 78.0–100.0)
Platelets: 150 10*3/uL (ref 150–400)
RBC: 4.33 MIL/uL (ref 3.87–5.11)
WBC: 6.4 10*3/uL (ref 4.0–10.5)
WBC: 7 10*3/uL (ref 4.0–10.5)

## 2010-12-17 LAB — PROTIME-INR
INR: 1.6 — ABNORMAL HIGH (ref 0.00–1.49)
Prothrombin Time: 19.7 seconds — ABNORMAL HIGH (ref 11.6–15.2)
Prothrombin Time: 19.8 seconds — ABNORMAL HIGH (ref 11.6–15.2)

## 2010-12-17 LAB — TYPE AND SCREEN: Antibody Screen: NEGATIVE

## 2010-12-17 LAB — URINE MICROSCOPIC-ADD ON

## 2010-12-19 ENCOUNTER — Ambulatory Visit: Payer: Medicare Other | Admitting: Internal Medicine

## 2010-12-20 ENCOUNTER — Ambulatory Visit: Payer: Medicare Other | Admitting: Internal Medicine

## 2010-12-23 ENCOUNTER — Ambulatory Visit: Payer: Medicare Other | Admitting: Internal Medicine

## 2010-12-23 ENCOUNTER — Encounter: Payer: Self-pay | Admitting: Internal Medicine

## 2010-12-23 ENCOUNTER — Ambulatory Visit (INDEPENDENT_AMBULATORY_CARE_PROVIDER_SITE_OTHER): Payer: Medicare Other | Admitting: Internal Medicine

## 2010-12-23 VITALS — BP 140/80 | HR 70 | Temp 98.5°F | Wt 174.0 lb

## 2010-12-23 DIAGNOSIS — F411 Generalized anxiety disorder: Secondary | ICD-10-CM

## 2010-12-23 DIAGNOSIS — F419 Anxiety disorder, unspecified: Secondary | ICD-10-CM | POA: Insufficient documentation

## 2010-12-23 DIAGNOSIS — G589 Mononeuropathy, unspecified: Secondary | ICD-10-CM

## 2010-12-23 DIAGNOSIS — M109 Gout, unspecified: Secondary | ICD-10-CM

## 2010-12-23 DIAGNOSIS — G479 Sleep disorder, unspecified: Secondary | ICD-10-CM

## 2010-12-23 DIAGNOSIS — R3915 Urgency of urination: Secondary | ICD-10-CM

## 2010-12-23 LAB — POCT URINALYSIS DIPSTICK
Leukocytes, UA: NEGATIVE
Nitrite, UA: NEGATIVE
Protein, UA: NEGATIVE
Spec Grav, UA: 1.02
Urobilinogen, UA: 0.2

## 2010-12-23 MED ORDER — ALLOPURINOL 100 MG PO TABS: 100.0000 mg | ORAL_TABLET | Freq: Every day | ORAL | Status: DC

## 2010-12-23 MED ORDER — GABAPENTIN 300 MG PO CAPS: 900.0000 mg | ORAL_CAPSULE | Freq: Every day | ORAL | Status: DC

## 2010-12-23 NOTE — Progress Notes (Signed)
Addended by: Mervin Hack on: 12/23/2010 03:06 PM   Modules accepted: Orders

## 2010-12-23 NOTE — Progress Notes (Signed)
Subjective:    Patient ID: Carrie [name], female    DOB: 05/03/1920, 77 y.o.   MRN: [id number]  HPI Gout is better Has been on the colcrys till yesterday Still has a twinge in her toes but was able to stop yesterday Has 2 more doses of the prednisone  Feels her neuropathy is worse So much pain in past 2 nights Not able to sleep in bed Has tried ice the past couple of nights So hot and painful Both feet involved--sharp pains up into calves also Some pain during the day now--not as bad as the night Feels better to be walking around on cool floor  SOB with exertion lately Mostly upon awakening---has panic feeling from the pain and feels dyspneic Tried xanax and this helped Tried 2 trazodone to sleep also  Current outpatient prescriptions:benazepril (LOTENSIN) 40 MG tablet, Take 40 mg by mouth daily.  , Disp: , Rfl: ;  cholecalciferol (VITAMIN D) 1000 UNITS tablet, Take 1,000 Units by mouth daily.  , Disp: , Rfl: ;  colchicine 0.6 MG tablet, Take 1 tablet (0.6 mg total) by mouth 2 (two) times daily as needed. 1 tab by mouth tow times a day as needed for gout pain, Disp: 60 tablet, Rfl: 0 furosemide (LASIX) 80 MG tablet, Take 80 mg by mouth. 1/2 table two times a day , Disp: , Rfl: ;  gabapentin (NEURONTIN) 300 MG capsule, Take 300 mg by mouth 3 (three) times daily. 1-2 caps at bedtime as needed for spells of worsened foot pain , Disp: , Rfl: ;  Gabapentin, PHN, 300 MG TABS, Take by mouth. 1 cap at bedtime for foot pain , Disp: , Rfl: ;  metoprolol (LOPRESSOR) 25 MG tablet, Take 25 mg by mouth daily.  , Disp: , Rfl:  nitroGLYCERIN (NITROSTAT) 0.4 MG SL tablet, Place 0.4 mg under the tongue. Place 1 tablet under tongue as directed   , Disp: , Rfl: ;  potassium chloride SA (K-DUR,KLOR-CON) 20 MEQ tablet, Take 20 mEq by mouth daily.  , Disp: , Rfl: ;  traMADol (ULTRAM) 50 MG tablet, Take 50 mg by mouth. 1/2 to 1 tab by mouth three times a day as needed for pain , Disp: , Rfl:  traZODone  (DESYREL) 50 MG tablet, Take 50 mg by mouth. Start with 1/2 tab for 4 days and if still not sleeping great, increase to a full tab , Disp: , Rfl: ;  vitamin B-12 (CYANOCOBALAMIN) 100 MCG tablet, Take 100 mcg by mouth daily.  , Disp: , Rfl: ;  warfarin (COUMADIN) 4 MG tablet, Take by mouth as directed.  , Disp: , Rfl:  DISCONTD: gabapentin (NEURONTIN) 100 MG capsule, Take 100 mg by mouth. 1-2 caps at bedtime as needed for spells of worsened foot pain , Disp: , Rfl:   Past Medical History  Diagnosis Date  . Atrial fibrillation   . Diastolic heart failure     mild volume overload and increased symptoms of shortness of breath  . DJD (degenerative joint disease) of lumbar spine   . Degenerative joint disease   . HTN (hypertension)   . History of recurrent UTIs     Dr. Artis Flock   . Sleep disturbance   . Neuropathy   . Gout     Past Surgical History  Procedure Date  . Ventricular ablation surgery     atrioventricular nodal ablation  . Ventricular cardiac pacemaker insertion     St. Jude Victory  Pacemaker programmed to ventricular demand pacing  .  Knee surgery     right  . Appendectomy   . Breast biopsy   . Pulse generator implant     placement of internal pulse generator for permanent spinal cord- stimulartor left hip    Family History  Problem Relation Age of Onset  . Heart disease Mother     heart failure  . Stroke Father     History   Social History  . Marital Status: Married    Spouse Name: N/A    Number of Children: 2  . Years of Education: N/A   Occupational History  . homemaker    Social History Main Topics  . Smoking status: Former Games developer  . Smokeless tobacco: Not on file   Comment: quit long ago   . Alcohol Use: Yes  . Drug Use: Not on file  . Sexually Active: Not on file   Other Topics Concern  . Not on file   Social History Narrative   Son Rosanne Ashing holds health care power of attorney. Does request DNR. No tube feeds in cognitively unaware   Review of  Systems No diarrhea or vomiting Feels her breathing is short--relates to the pain     Objective:   Physical Exam  Constitutional: She appears well-developed and well-nourished.       NAD but looks ill at ease  Neck: Normal range of motion. Neck supple. No thyromegaly present.  Cardiovascular: Normal rate, regular rhythm and normal heart sounds.  Exam reveals no gallop.   No murmur heard. Pulmonary/Chest: Effort normal and breath sounds normal. No respiratory distress. She has no wheezes. She has no rales.  Musculoskeletal: She exhibits no edema and no tenderness.       Left MTP completely quiet now No tenderness  Lymphadenopathy:    She has no cervical adenopathy.          Assessment & Plan:

## 2010-12-23 NOTE — Patient Instructions (Addendum)
Please increase the gabapentin to 3 tabs at bedtime (900mg  total) Please start the new medication, allopurinol, to prevent the gout. Start with 1 tab daily but we may need to increase the dose If you continue to have pain at night, try the tramadol

## 2010-12-25 ENCOUNTER — Ambulatory Visit (INDEPENDENT_AMBULATORY_CARE_PROVIDER_SITE_OTHER): Payer: Medicare Other | Admitting: Emergency Medicine

## 2010-12-25 DIAGNOSIS — I4891 Unspecified atrial fibrillation: Secondary | ICD-10-CM

## 2010-12-25 DIAGNOSIS — Z7901 Long term (current) use of anticoagulants: Secondary | ICD-10-CM

## 2010-12-25 LAB — POCT INR: INR: 2.3

## 2010-12-25 MED ORDER — WARFARIN SODIUM 4 MG PO TABS: 4.0000 mg | ORAL_TABLET | ORAL | Status: DC

## 2011-01-01 ENCOUNTER — Encounter: Payer: Self-pay | Admitting: Internal Medicine

## 2011-01-01 ENCOUNTER — Ambulatory Visit (INDEPENDENT_AMBULATORY_CARE_PROVIDER_SITE_OTHER): Payer: Medicare Other | Admitting: Internal Medicine

## 2011-01-01 VITALS — BP 130/70 | HR 70 | Temp 98.5°F | Wt 177.0 lb

## 2011-01-01 DIAGNOSIS — G479 Sleep disorder, unspecified: Secondary | ICD-10-CM

## 2011-01-01 DIAGNOSIS — M109 Gout, unspecified: Secondary | ICD-10-CM

## 2011-01-01 DIAGNOSIS — I5032 Chronic diastolic (congestive) heart failure: Secondary | ICD-10-CM

## 2011-01-01 DIAGNOSIS — G589 Mononeuropathy, unspecified: Secondary | ICD-10-CM

## 2011-01-01 DIAGNOSIS — I509 Heart failure, unspecified: Secondary | ICD-10-CM

## 2011-01-01 LAB — BASIC METABOLIC PANEL
CO2: 33 mEq/L — ABNORMAL HIGH (ref 19–32)
Calcium: 9 mg/dL (ref 8.4–10.5)
Creatinine, Ser: 0.9 mg/dL (ref 0.4–1.2)

## 2011-01-01 NOTE — Progress Notes (Signed)
Subjective:    Patient ID: Carrie [name], female    DOB: [birth year], 77 y.o.   MRN: [id number]  HPI Feet still painful  BOth still bad---has resting pain that runs up both feet Bad attack at night recently with worsening burning and kept her up Overall better at night  Still some swelling On allopurinol 200mg  daily Occ crampy feeling when walking Ongoing decreased sensation in feet--can't feel the covers Hasn't used the colchicine since on the allopurinol  Breathing is not right---gets sense "that I can't get a long breath" ANxiety builds up Seems to improve with getting up and moving around No chest pain No syncope  Current outpatient prescriptions:allopurinol (ZYLOPRIM) 100 MG tablet, Take 1-2 tablets (100-200 mg total) by mouth daily., Disp: 60 tablet, Rfl: 5;  benazepril (LOTENSIN) 40 MG tablet, Take 40 mg by mouth daily.  , Disp: , Rfl: ;  cholecalciferol (VITAMIN D) 1000 UNITS tablet, Take 1,000 Units by mouth daily.  , Disp: , Rfl:  colchicine 0.6 MG tablet, Take 1 tablet (0.6 mg total) by mouth 2 (two) times daily as needed. 1 tab by mouth tow times a day as needed for gout pain, Disp: 60 tablet, Rfl: 0;  furosemide (LASIX) 80 MG tablet, Take 80 mg by mouth. 1/2 table two times a day , Disp: , Rfl: ;  gabapentin (NEURONTIN) 300 MG capsule, Take 3 capsules (900 mg total) by mouth at bedtime., Disp: 90 capsule, Rfl: 5 Gabapentin, PHN, 300 MG TABS, Take by mouth. 1 cap at bedtime for foot pain , Disp: , Rfl: ;  metoprolol (LOPRESSOR) 25 MG tablet, Take 25 mg by mouth daily.  , Disp: , Rfl: ;  nitroGLYCERIN (NITROSTAT) 0.4 MG SL tablet, Place 0.4 mg under the tongue. Place 1 tablet under tongue as directed   , Disp: , Rfl: ;  potassium chloride SA (K-DUR,KLOR-CON) 20 MEQ tablet, Take one tablet by mouth daily, Disp: , Rfl:  traMADol (ULTRAM) 50 MG tablet, Take 50 mg by mouth. 1/2 to 1 tab by mouth three times a day as needed for pain , Disp: , Rfl: ;  traZODone (DESYREL) 50 MG tablet,  Take 50 mg by mouth. Start with 1/2 tab for 4 days and if still not sleeping great, increase to a full tab , Disp: , Rfl: ;  vitamin B-12 (CYANOCOBALAMIN) 100 MCG tablet, Take 100 mcg by mouth daily.  , Disp: , Rfl:  warfarin (COUMADIN) 4 MG tablet, Take 1 tablet (4 mg total) by mouth as directed., Disp: 30 tablet, Rfl: 3  Past Medical History  Diagnosis Date  . Atrial fibrillation   . Diastolic heart failure     mild volume overload and increased symptoms of shortness of breath  . DJD (degenerative joint disease) of lumbar spine   . Degenerative joint disease   . HTN (hypertension)   . History of recurrent UTIs     Dr. Artis Flock   . Sleep disturbance   . Neuropathy   . Gout     Past Surgical History  Procedure Date  . Ventricular ablation surgery     atrioventricular nodal ablation  . Ventricular cardiac pacemaker insertion     St. Jude Victory  Pacemaker programmed to ventricular demand pacing  . Knee surgery     right  . Appendectomy   . Breast biopsy   . Pulse generator implant     placement of internal pulse generator for permanent spinal cord- stimulartor left hip    Family History  Problem Relation Age of Onset  . Heart disease Mother     heart failure  . Stroke Father     History   Social History  . Marital Status: Married    Spouse Name: N/A    Number of Children: 2  . Years of Education: N/A   Occupational History  . homemaker    Social History Main Topics  . Smoking status: Former Games developer  . Smokeless tobacco: Not on file   Comment: quit long ago   . Alcohol Use: Yes  . Drug Use: Not on file  . Sexually Active: Not on file   Other Topics Concern  . Not on file   Social History Narrative   Son Rosanne Ashing holds health care power of attorney. Does request DNR. No tube feeds in cognitively unaware   Review of Systems Head "doesn't feel right" Appetite is okay    Objective:   Physical Exam  Constitutional: She appears well-developed and well-nourished.    Neck: Normal range of motion.  Cardiovascular: Normal rate, regular rhythm and normal heart sounds.  Exam reveals no gallop.   No murmur heard. Pulmonary/Chest: Effort normal and breath sounds normal. No respiratory distress. She has no wheezes. She has no rales.  Musculoskeletal: Normal range of motion. She exhibits no tenderness.       Feet puffy without pitting Slight redness and tenderness at left 1st MTP--but not striking  Lymphadenopathy:    She has no cervical adenopathy.  Neurological:       Sensitivity and pain along both distal feet/toes  Psychiatric: Her behavior is normal.       Slightly anxious          Assessment & Plan:

## 2011-01-01 NOTE — Patient Instructions (Signed)
Please stop the trazodone Add a 300mg  gabapentin in the morning. If the neuropathy pain continues, you can add one around lunchtime as well

## 2011-01-03 ENCOUNTER — Other Ambulatory Visit: Payer: Self-pay | Admitting: *Deleted

## 2011-01-03 ENCOUNTER — Telehealth: Payer: Self-pay | Admitting: *Deleted

## 2011-01-03 MED ORDER — GABAPENTIN 300 MG PO CAPS: 900.0000 mg | ORAL_CAPSULE | Freq: Every day | ORAL | Status: DC

## 2011-01-03 NOTE — Telephone Encounter (Signed)
Message copied by Mervin Hack on Fri Jan 03, 2011  5:13 PM ------      Message from: Tillman Abide      Created: Fri Jan 03, 2011  8:09 AM       Please call      Uric acid level is fairly low now--continue the current allopurinol      The kidney tests are normal

## 2011-01-03 NOTE — Telephone Encounter (Signed)
Spoke with patient about lab results. Pt states that now her left foot is now swollen and red and wanted to know if this could be a side effect of the increasing the gabapentin? I advised pt that Dr.Letvak wasn't here and that I would send him a message. I asked pt to continue to take furosemide as directed and to call if any changes.  I will send a message to Dr.Letvak to call the pt if he can.

## 2011-01-03 NOTE — Telephone Encounter (Signed)
This is likely not related to the gabapentin.  It is likely related to gout.  I would have her elevated her foot and fu prn.  If she has a fever, she needs eval at that point.

## 2011-01-03 NOTE — Telephone Encounter (Signed)
Spoke with patient and advised results, she will call on Monday to let us know how she's doing.

## 2011-01-06 ENCOUNTER — Ambulatory Visit (INDEPENDENT_AMBULATORY_CARE_PROVIDER_SITE_OTHER): Payer: Medicare Other | Admitting: Internal Medicine

## 2011-01-06 ENCOUNTER — Telehealth: Payer: Self-pay | Admitting: *Deleted

## 2011-01-06 ENCOUNTER — Other Ambulatory Visit: Payer: Self-pay

## 2011-01-06 ENCOUNTER — Encounter: Payer: Self-pay | Admitting: Internal Medicine

## 2011-01-06 ENCOUNTER — Ambulatory Visit (INDEPENDENT_AMBULATORY_CARE_PROVIDER_SITE_OTHER): Payer: Medicare Other | Admitting: *Deleted

## 2011-01-06 VITALS — BP 128/70 | HR 70 | Temp 98.9°F | Wt 177.0 lb

## 2011-01-06 DIAGNOSIS — G479 Sleep disorder, unspecified: Secondary | ICD-10-CM

## 2011-01-06 DIAGNOSIS — G589 Mononeuropathy, unspecified: Secondary | ICD-10-CM

## 2011-01-06 DIAGNOSIS — I495 Sick sinus syndrome: Secondary | ICD-10-CM

## 2011-01-06 DIAGNOSIS — Z950 Presence of cardiac pacemaker: Secondary | ICD-10-CM

## 2011-01-06 DIAGNOSIS — M109 Gout, unspecified: Secondary | ICD-10-CM

## 2011-01-06 NOTE — Telephone Encounter (Signed)
Opened in error

## 2011-01-06 NOTE — Telephone Encounter (Signed)
Please call her If it is still red and swollen, I would be more concerned about infection since her uric acid is low Please have her come in today if it is still not looking good

## 2011-01-06 NOTE — Patient Instructions (Addendum)
Please decrease the colchicine to once a day till you are back from your trip. It is okay to take a second one if the gout acts up more It is okay to take the allopurinol--2 tabs at the same time. Please restart this now It is okay for you to restart the trazodone at bedtime

## 2011-01-06 NOTE — Progress Notes (Signed)
Pacer check

## 2011-01-06 NOTE — Progress Notes (Signed)
Subjective:    Patient ID: Carrie [name], female    DOB: 22-Mar-1920, 77 y.o.   MRN: [id number]  HPI Feet started getting worse 4 days ago Really worse 3 days ago so she called  Has continued the allopurinol Restarted the colchicine at the suggestion of her pharmacist This has helped  Had been swollen real bad and in sig pain Now the pain is more in right foot--left is better Pain starts by distal great toe and radiates up proximally  Has started the gabapentin in the day Not sure how it is going The burning is gone at night  Current outpatient prescriptions:allopurinol (ZYLOPRIM) 100 MG tablet, Take 1-2 tablets (100-200 mg total) by mouth daily., Disp: 60 tablet, Rfl: 5;  benazepril (LOTENSIN) 40 MG tablet, Take 40 mg by mouth daily.  , Disp: , Rfl: ;  cholecalciferol (VITAMIN D) 1000 UNITS tablet, Take 1,000 Units by mouth daily.  , Disp: , Rfl: ;  colchicine 0.6 MG tablet, 1 tab by mouth two times a day as needed for gout pain , Disp: , Rfl:  furosemide (LASIX) 80 MG tablet, Take 80 mg by mouth. 1/2 table two times a day , Disp: , Rfl: ;  gabapentin (NEURONTIN) 300 MG capsule, Take 3 capsules (900 mg total) by mouth at bedtime. And take 1 in the morning. Add 1 at lunch time if needed, Disp: 90 capsule, Rfl: 3;  metoprolol (LOPRESSOR) 25 MG tablet, Take 25 mg by mouth daily.  , Disp: , Rfl:  nitroGLYCERIN (NITROSTAT) 0.4 MG SL tablet, Place 0.4 mg under the tongue. Place 1 tablet under tongue as directed   , Disp: , Rfl: ;  potassium chloride SA (K-DUR,KLOR-CON) 20 MEQ tablet, Take one tablet by mouth daily, Disp: , Rfl: ;  traMADol (ULTRAM) 50 MG tablet, Take 50 mg by mouth. 1/2 to 1 tab by mouth three times a day as needed for pain , Disp: , Rfl:  traZODone (DESYREL) 50 MG tablet, take 2 tablets at bedtime to help sleep, Disp: , Rfl: ;  vitamin B-12 (CYANOCOBALAMIN) 100 MCG tablet, Take 100 mcg by mouth daily.  , Disp: , Rfl: ;  warfarin (COUMADIN) 4 MG tablet, Take 1 tablet (4 mg total)  by mouth as directed., Disp: 30 tablet, Rfl: 3 DISCONTD: colchicine 0.6 MG tablet, Take 1 tablet (0.6 mg total) by mouth 2 (two) times daily as needed. 1 tab by mouth tow times a day as needed for gout pain, Disp: 60 tablet, Rfl: 0;  DISCONTD: gabapentin (NEURONTIN) 300 MG capsule, Take 3 capsules (900 mg total) by mouth at bedtime., Disp: 90 capsule, Rfl: 5  Past Medical History  Diagnosis Date  . Atrial fibrillation   . Diastolic heart failure     mild volume overload and increased symptoms of shortness of breath  . DJD (degenerative joint disease) of lumbar spine   . Degenerative joint disease   . HTN (hypertension)   . History of recurrent UTIs     Dr. Artis Flock   . Sleep disturbance   . Neuropathy   . Gout     Past Surgical History  Procedure Date  . Ventricular ablation surgery     atrioventricular nodal ablation  . Ventricular cardiac pacemaker insertion     St. Jude Victory  Pacemaker programmed to ventricular demand pacing  . Knee surgery     right  . Appendectomy   . Breast biopsy   . Pulse generator implant     placement of internal  pulse generator for permanent spinal cord- stimulartor left hip    Family History  Problem Relation Age of Onset  . Heart disease Mother     heart failure  . Stroke Father     History   Social History  . Marital Status: Married    Spouse Name: N/A    Number of Children: 2  . Years of Education: N/A   Occupational History  . homemaker    Social History Main Topics  . Smoking status: Former Games developer  . Smokeless tobacco: Not on file   Comment: quit long ago   . Alcohol Use: Yes  . Drug Use: Not on file  . Sexually Active: Not on file   Other Topics Concern  . Not on file   Social History Narrative   Son Rosanne Ashing holds health care power of attorney. Does request DNR. No tube feeds in cognitively unaware   Review of Systems No fever Sleep off for 2 nights---- wonders about going back on the trazodone    Objective:    Physical Exam  Musculoskeletal: She exhibits edema. She exhibits no tenderness.       Mild edema No tenderness in ankles, MTP joints  Skin: Skin is warm. No rash noted.          Assessment & Plan:

## 2011-01-14 ENCOUNTER — Telehealth: Payer: Self-pay | Admitting: *Deleted

## 2011-01-14 MED ORDER — LACTULOSE SOLN: Status: DC

## 2011-01-14 NOTE — Telephone Encounter (Signed)
Carrie [name] at [facility] that lactulose was called in but will be tomorrow before it can be delivered, their driver has left for the day.

## 2011-01-14 NOTE — Telephone Encounter (Signed)
Okay to send lactulose Rx #32 ounces x 3 15-30cc bid prn for constipation

## 2011-01-14 NOTE — Telephone Encounter (Signed)
rx was called in to [facility] by Jacki Cones, she will let [place] know.

## 2011-01-14 NOTE — Telephone Encounter (Signed)
Mandy at [facility] called, pt is complaining of no BM for 3 days, and nothing significant for 5 days.  She has tried otc constipation meds without relief.  Mandy suggested lactilose to her and the patient told her that that has helped in the past.  Angelica Chessman is asking that it be sent to [facility] pharmacy with instructions to deliver to patient.  Please let Angelica Chessman know if this is done.

## 2011-01-21 ENCOUNTER — Encounter: Payer: Self-pay | Admitting: Cardiovascular Disease

## 2011-01-21 ENCOUNTER — Ambulatory Visit (INDEPENDENT_AMBULATORY_CARE_PROVIDER_SITE_OTHER): Payer: Medicare Other | Admitting: Cardiovascular Disease

## 2011-01-21 DIAGNOSIS — I4891 Unspecified atrial fibrillation: Secondary | ICD-10-CM

## 2011-01-21 DIAGNOSIS — I509 Heart failure, unspecified: Secondary | ICD-10-CM

## 2011-01-21 DIAGNOSIS — R609 Edema, unspecified: Secondary | ICD-10-CM

## 2011-01-21 DIAGNOSIS — I872 Venous insufficiency (chronic) (peripheral): Secondary | ICD-10-CM

## 2011-01-21 DIAGNOSIS — I5032 Chronic diastolic (congestive) heart failure: Secondary | ICD-10-CM

## 2011-01-21 DIAGNOSIS — R0602 Shortness of breath: Secondary | ICD-10-CM

## 2011-01-21 DIAGNOSIS — F419 Anxiety disorder, unspecified: Secondary | ICD-10-CM

## 2011-01-21 DIAGNOSIS — Z7901 Long term (current) use of anticoagulants: Secondary | ICD-10-CM

## 2011-01-21 DIAGNOSIS — Z950 Presence of cardiac pacemaker: Secondary | ICD-10-CM

## 2011-01-21 DIAGNOSIS — I10 Essential (primary) hypertension: Secondary | ICD-10-CM

## 2011-01-21 LAB — POCT INR: INR: 1.9

## 2011-01-21 MED ORDER — FUROSEMIDE 80 MG PO TABS: 80.0000 mg | ORAL_TABLET | ORAL | Status: DC

## 2011-01-21 MED ORDER — ALPRAZOLAM 0.25 MG PO TABS: 0.1250 mg | ORAL_TABLET | Freq: Every evening | ORAL | Status: DC | PRN

## 2011-01-21 NOTE — Discharge Summary (Signed)
NAME:  Carrie [name], IGLESIAS NO.:  [id number]   MEDICAL RECORD NO.:  [id number]          PATIENT TYPE:  INP   LOCATION:  5002                         FACILITY:  [facility]   PHYSICIAN:  Nelda Severe, MD      DATE OF BIRTH:  19-Jun-1920   DATE OF ADMISSION:  01/23/2009  DATE OF DISCHARGE:  01/24/2009                               DISCHARGE SUMMARY   ADMITTING DIAGNOSIS:  Retained spinal cord stimulator on Jan 23, 2009.  She was admitted under the care of Dr. Nelda Severe for operative plan  of removal of spinal cord stimulator.  Postoperatively, estimated blood  loss was 100 mL. Grossly neurovascularly motor intact.  No  complications.   HOSPITAL COURSE:  She was admitted to [facility].  Postop day  #1, she was alert and oriented, ambulating independently.  Prior  complaints of nausea were controlled with p.o. medication.  She was  afebrile.  Her INR was 1.6.  We resumed her Coumadin postoperatively.  Distally neurovascularly motor intact.  Incisions were clean, dry, and  intact.  The patient will be discharged home and she is going to be  taking Tylenol for pain.  She will follow up in our office in  approximately 1 week.   DIAGNOSIS:  Retained spinal cord stimulator.   ACTIVITY LEVEL:  Walk.   DIET:  Regular.   DISCHARGE MEDICATIONS:  She will resume her regular home medications as  usual listed in the MedRec sheet to include:  1. Coumadin 2 mg.  2. Benazepril 40 mg daily.  3. Lasix 80 mg daily.  4. Metoprolol 25 mg daily.  5. Amoxicillin/clavulanic acid 2 times daily.   FINAL DIAGNOSIS   Retained spinal cord stimulaor (pulse generator and electrodes), lumbar  spondylosis and scoliosis      Lianne Cure, P.A.      Nelda Severe, MD  Electronically Signed   MC/MEDQ  D:  02/14/2009  T:  02/15/2009  Job:  [id number]

## 2011-01-21 NOTE — Progress Notes (Signed)
   Patient ID: Carrie [name], female    DOB: [birth year], 77 y.o.   MRN: [id number]  HPI Comments:  77 year old  female patient with a h/o diastolic heart failure,  pneumonia 01/2010,mild pulmonary hypertension,  long history of back problems requiring surgery,  chronic atrial fibrillation with AV node ablation, placement of pacemaker, who presents for routine followup   She reports that recently, she has not been feeling very well. She has increased shortness of breath, increased lower extremity edema, anxiety with her shortness of breath. Her weight has increased at least 3 pounds. She has difficulty putting her shoes on secondary to the swelling. She has difficulty sleeping at night time and has to sleep in a recliner because of shortness of breath. She is requesting something for anxiety and would like assistance with her edema and shortness of breath.   She uses a walker at [facility], a walker when she is doing errands. She had pain in her feet recently was started on Neurontin which helped her foot discomfort.   EKG shows ventricularly paced rhythm with rate 70 beats per minute.        Review of Systems  Constitutional: Negative.   HENT: Negative.   Eyes: Negative.   Respiratory: Positive for shortness of breath.   Cardiovascular: Positive for leg swelling.       Difficulty lying flat at night time secondary to shortness of breath  Gastrointestinal: Negative.   Musculoskeletal: Negative.   Skin: Negative.   Neurological: Negative.   Hematological: Negative.   Psychiatric/Behavioral: The patient is nervous/anxious.   All other systems reviewed and are negative.    BP 100/70  Pulse 70  Ht 5\' 4"  (1.626 m)  Wt 179 lb (81.194 kg)  BMI 30.73 kg/m2   Physical Exam  Nursing note and vitals reviewed. Constitutional: She is oriented to person, place, and time. She appears well-developed and well-nourished.  HENT:  Head: Normocephalic.  Nose: Nose normal.  Mouth/Throat:  Oropharynx is clear and moist.  Eyes: Conjunctivae are normal. Pupils are equal, round, and reactive to light.  Neck: Normal range of motion. Neck supple. No JVD present.  Cardiovascular: Normal rate, regular rhythm, S1 normal, S2 normal, normal heart sounds and intact distal pulses.  Exam reveals no gallop and no friction rub.   No murmur heard.      She does have 1+ edema to below the knees,  Pulmonary/Chest: Effort normal and breath sounds normal. No respiratory distress. She has no wheezes. She has no rales. She exhibits no tenderness.  Abdominal: Soft. Bowel sounds are normal. She exhibits no distension. There is no tenderness.  Musculoskeletal: Normal range of motion. She exhibits edema. She exhibits no tenderness.  Lymphadenopathy:    She has no cervical adenopathy.  Neurological: She is alert and oriented to person, place, and time. Coordination normal.  Skin: Skin is warm and dry. No rash noted. No erythema.  Psychiatric: She has a normal mood and affect. Her behavior is normal. Judgment and thought content normal.         Assessment and Plan

## 2011-01-21 NOTE — Assessment & Plan Note (Signed)
She does have a component of venous insufficiency in addition to edema from pulmonary hypertension. Echocardiogram in 2000 and showed mildly elevated right ventricular systolic pressures. I suspect her edema may improve to some degree with diuresis. We have also suggested she try TED hose.

## 2011-01-21 NOTE — Assessment & Plan Note (Signed)
She has some classic signs of diastolic heart failure. We have suggested she increase her Lasix to 80 mg in the morning, 40 mg in the evening. We've asked her to restrict her fluids. If she does not find relief with the higher dose in the morning, she can take 80 mg b.i.d..

## 2011-01-21 NOTE — Assessment & Plan Note (Signed)
[facility]                            CARDIOLOGY OFFICE NOTE   Carrie [name], EICHEL                      MRN:          [id number]  DATE:03/17/2008                            DOB:          April 16, 1920    PRIMARY CARE PHYSICIAN:  Julieanne Manson in [place].   CLINICAL HISTORY:  [name] is an 77 year old returned for followup  management of her diastolic heart failure in pacemaker.  She has a St.  Jude Victory VV pacemaker implanted, which is programmed to VVI and she  has had an AV nodal ablation.  She has chronic atrial fibrillation.  She  recently saw Dietrich Pates with symptoms of increasing shortness of breath.  She had episodes of shortness breath at night, which she felt like her  heart was beating fast.  Interrogation of pacemaker showed no fast  ventricular rates.  Dr. Tenny Craw increase her Lasix and she has done  somewhat better, although she still has had 2 spells over the last 4  weeks.   PAST MEDICAL HISTORY:  Significant for history of diastolic heart  failure.  She also has severe lumbar spine disease and degenerative  joint disease of the right knee.   CURRENT MEDICATION:  1. Micardis 80 mg.  2. Coumadin.  3. Lasix 80 mg in the morning and 10 mg in the afternoon.   PHYSICAL EXAMINATION:  VITAL SIGNS:  The blood pressure was 159/90,  pulse 79 and regular.  There was venous pulsation visible just at the  clavicle.  CHEST:  Clear.  HEART:  Rhythm was regular.  No murmurs, rubs, or gallops.  ABDOMEN:  Soft with normal bowel sounds.  EXTREMITIES:  Peripheral pulses were full.  There was trace peripheral  edema.   LABORATORY DATA:  Electrocardiogram showed ventricular paced rhythm at a  rate of 70.   We done an echocardiogram since her last visit and this showed an  ejection fraction of 50% with normal LV systolic function.  There was  some increased thickness of the septum and posterior wall.  There is  also some elevation of  pulmonary pressure estimated at systolic of 47.   IMPRESSION:  1. Chronic atrial fibrillation status post atrioventricular nodal      ablation.  2. Status post St. Jude Victory VV pacemaker implanted in September      2006, programmed now to VVI mode.  3. Diastolic heart failure now appears to be euvolemic.  4. Episodic shortness of breath and feeling of tachy palpitations      possibly related to diastolic heart failure and possibly anxiety.  5. Severe lumbar spine disease.  6. Degenerative joint disease of the right knee.   RECOMMENDATIONS:  Ms. Foley has diastolic heart failure, her volume  status appears pretty good.  Her blood pressures up and possible she may  become more hypertensive and may have episodes of exacerbation of her  heart failure related to this.  Fact that she has LVH on her  echocardiogram would support this.  We will plan to add metoprolol 25 mg  b.i.d. to her current medications for better  blood pressure control.  We  will continue the same Lasix dose.  We will get a BMP and CBC today.  Also get a chest x-ray today.  Also given some Xanax 0.25 mg for anxiety  p.r.n.   ADDENDUM:  Ms. Santoli has been under increased stress recently because  her husband is feeling quite ill.  He is taking care by Dr. Myrtis Ser and has  kidney function problems.  They both stay at [facility] for time at home  in [place].     Bruce Elvera Lennox Juanda Chance, MD, [facility]  Electronically Signed    BRB/MedQ  DD: 03/17/2008  DT: 03/18/2008  Job #: [id number]

## 2011-01-21 NOTE — Assessment & Plan Note (Signed)
[facility]                            CARDIOLOGY OFFICE NOTE   SOREN, PIGMAN                      MRN:          [id number]  DATE:07/03/2008                            DOB:          [birth year]    PRIMARY CARE PHYSICIAN:  Julieanne Manson  [place]   CLINICAL HISTORY:  Ms. Doke is 77 years old and returns for  management of her diastolic heart failure pacemaker.  She has chronic  atrial fibrillation and has had an AV nodal ablation as Runner, broadcasting/film/video to VVI.  She also has had diastolic heart failure,  managed with diuretics.   She says she is doing only fair.  She says she gives out with very  minimal exertion and this has been somewhat progressive over the last  couple of years.  She does not feel like she has had any swelling.  She  also had symptoms at night where she gets very short of breath and wakes  up at night and has to sit on the side of the bed.  This may be happens  2 or 3 times a week.  She has had no chest pain or palpitations.   PAST MEDICAL HISTORY:  Significant for lumbar spine disease and  degenerative disease of the right knee.   CURRENT MEDICATION:  1. Micardis 80 mg daily.  2. Coumadin.  3. Lasix 80 mg daily.   PHYSICAL EXAMINATION:  VITAL SIGNS:  Today, the blood pressure was  136/77, pulse 69 and regular.  NECK:  The venous pulsation could be visible up to near the jaw.  Carotid pulses were full without bruits.  CHEST:  Clear.  CARDIAC:  Rhythm was regular.  I could hear a 2/6 systolic murmur at the  left sternal edge.  ABDOMEN:  Soft with normal bowel sounds.  There is no  hepatosplenomegaly.  Peripheral pulse were full.  There was trace  peripheral edema.   Interrogation of pacemaker showed that she is pacer dependent.  She is  in atrial fibrillation and ventricular threshold was good at 1.   IMPRESSION:  1. Chronic atrial fibrillation status post atrioventricular nodal  ablation.  2. Status post St. Jude Victory Pacemaker programmed to ventricular      demand pacing.  3. Diastolic heart failure with mild volume overload and increased      symptoms of shortness of breath.  4. Lumbar spine disease.  5. Degenerative joint disease.   RECOMMENDATIONS:  Ms. Sendejo has developed increasing symptoms from  her diastolic heart failure, probably from deconditioning, and weight as  well.  She has tried to stay active and does aerobics classes twice a  week.  She and her husband lived in [facility] in  [place], which has an assisted living and a skilled nursing area  attached to it.  We will plan to increase her Lasix from 80 in the  morning and 40 in the afternoon.  We will get a BNP in a week.  Also put  on Isordil 30 mg at bedtime to see if we can help  her with her nocturnal  dyspnea.  I will plan to see her back in 4 months.     Bruce Elvera Lennox Juanda Chance, MD, [facility]  Electronically Signed    BRB/MedQ  DD: 07/03/2008  DT: 07/04/2008  Job #: [id number]

## 2011-01-21 NOTE — Assessment & Plan Note (Signed)
Pacemaker check per [facility] EP every 6 months.

## 2011-01-21 NOTE — Assessment & Plan Note (Signed)
Paced rhythm at 70 beats per minute. History of AV node ablation.

## 2011-01-21 NOTE — Assessment & Plan Note (Signed)
Blood pressure is well controlled on today's visit. No changes made to the medications. 

## 2011-01-21 NOTE — Op Note (Signed)
NAMEALLISSA, ALBRIGHT NO.:  [id number]   MEDICAL RECORD NO.:  [id number]          PATIENT TYPE:  AMB   LOCATION:  DAY                          FACILITY:  [facility]   PHYSICIAN:  Georges Lynch. Gioffre, M.D.DATE OF BIRTH:  06-12-1920   DATE OF PROCEDURE:  07/14/2007  DATE OF DISCHARGE:                               OPERATIVE REPORT   SURGEON:  Dr. Darrelyn Hillock.   ASSISTANT:  The nurse.   PREOPERATIVE DIAGNOSIS:  Severe degenerative arthritis of right knee.   POSTOPERATIVE DIAGNOSIS:  Severe degenerative arthritis of right knee.   OPERATION:  1. Diagnostic arthroscopy right knee.  2. Medial meniscectomy right knee.  3. Lateral meniscectomy right knee.  4. Synovectomy suprapatellar pouch right knee.  5. Abrasion chondroplasty medial femoral condyle right knee.   PROCEDURE:  Under general anesthesia, a routine orthopedic prep and  drape of the right lower extremity was carried out.  A small punctate  incision was made in the suprapatellar pouch, inflow cannula was  inserted and the knee was distended with saline.  Another small punctate  incision was made in the lateral joint.  The arthroscope was entered  from the lateral approach and a complete diagnostic arthroscopy was  carried out.  Note she had what looked like severe chondrocalcinosis.  She had a severe tear of the medial meniscus.   I went over the medial side and did an abrasion chondroplasty of the  medial femoral condyle followed by a partial medial meniscectomy.  The  ACL was intact.  I went over the lateral joint and noted that she had a  complete complex tear of the lateral meniscus.  I introduced a shaver  suction device and did a lateral meniscectomy as well.  Bleeders were  cauterized with the ArthroCare.  Following that I went up into the  suprapatellar pouch and noted a severe chronic synovitis.  I introduced  the ArthroCare and did a synovectomy and cauterized the vessels.   Following that we  thoroughly irrigated out the knee, closed all three  punctate incisions with 3-0 nylon suture.  Sterile Neosporin dressings  were applied after I injected 30 mL of 0.5% Marcaine with epinephrine  into the knee joint.  Following that she was placed in a bundle  dressing.  She had 1 gram of IV Ancef preop.   FOLLOW-UP CARE:  She will go back on her Coumadin that she was taking  prior to her admission.           ______________________________  Georges Lynch Darrelyn Hillock, M.D.     RAG/MEDQ  D:  07/14/2007  T:  07/15/2007  Job:  [id number]   cc:   Windy Fast A. Darrelyn Hillock, M.D.  Fax: [phone]   Bruce R. Juanda Chance, MD, [facility]  [address]  [place], [place] [address]

## 2011-01-21 NOTE — Assessment & Plan Note (Addendum)
I suspect her anxiety is secondary to mild congestive heart failure. She is requesting Xanax as she was previously on this medication p.r.n. For sleep. We have suggested that we could start a prescription for additional refills should probably come from Dr. Alphonsus Sias. I recommended she try a half tab to start.

## 2011-01-21 NOTE — Op Note (Signed)
NAME:  Carrie [name], Carrie [name] NO.:  [id number]   MEDICAL RECORD NO.:  [id number]          PATIENT TYPE:  INP   LOCATION:  5002                         FACILITY:  [facility]   PHYSICIAN:  Nelda Severe, MD      DATE OF BIRTH:  1919/11/19   DATE OF PROCEDURE:  01/23/2009  DATE OF DISCHARGE:                               OPERATIVE REPORT   SURGEON:  Nelda Severe, MD   ASSISTANT:  Lianne Cure, PA-C   PREOPERATIVE DIAGNOSIS:  Retained spinal cord stimulator,  ineffective.   POSTPROCEDURE DIAGNOSIS:  Retained spinal cord stimulator,  ineffective.   OPERATIVE PROCEDURE:  Removal of spinal electrodes and generator with 2  incisions.   OPERATIVE NOTE:  The patient was positioned prone on a Wilson frame  after induction of general endotracheal anesthesia and infusion of  intravenous antibiotics.  The upper extremities were carefully padded  and positioned on arm boards.  The thoracolumbar area was prepped with  DuraPrep and draped in rectangular fashion.  The drapes were secured  with Ioban.   An AP fluoroscopic view was taken over the lower thoracic spine form the  point at which the intraspinal electrodes entered the spine.  Previous  incision was reopened.  Actually first, we opened the incision over the  palpable generator.  It was about a quarter of an inch deep to the  surface of the skin.  The power generator was removed and the wires cut.  We then further exposed the electrodes medially at the spinal site.  We  were easily able to pull the remaining wires through from the site of  implantation of the pulse generator, which was above the iliac crest on  the left side.  There was then a fairly tedious dissection of scar  tissue around the electrodes down from the spinal canal.  Eventually, we  were able to extricate the paddle and the electrodes.  A small amount of  Silastic insulation from one of the leads broke off and could not be  completely removed.  We did wheel  the fluoroscopy machine in place.  I  could see no evidence of retained spinal electrode wires.   Both wounds were then irrigated.  Thoracolumbar fascia was closed using  interrupted figure-of-eight #1 Vicryl sutures.  In the pulse generator  wound, the pseudomembrane which had formed around the pulse generator  was quilted down to itself to try to eliminate dead space using #1  Vicryl suture.  The subcutaneous layer of both wounds were closed using  interrupted Vicryl suture.  The skin was closed using subcuticular 3-0  undyed Vicryl in running  fashion.  Skin edges were reinforced with Steri-Strips.  Antibiotic  ointment dressing was applied, secured with OpSite.   Blood loss was negligible.  No intraoperative complications.      Nelda Severe, MD  Electronically Signed     MT/MEDQ  D:  01/23/2009  T:  01/24/2009  Job:  [id number]

## 2011-01-21 NOTE — Assessment & Plan Note (Signed)
[facility]                            CARDIOLOGY OFFICE NOTE   Carrie [name], Carrie [name]                      MRN:          [id number]  DATE:12/31/2006                            DOB:          1920/04/28    PRIMARY CARE PHYSICIAN:  Julieanne Manson, M.D.   CLINICAL COURSE:  Carrie [name] is 77 years old and has sick sinus  syndrome with chronic atrial fibrillation and a Victory 5610 VVI  pacemaker.  She has had previous AV nodal ablation and is pacer-  dependent.  She also has a history of congestive heart failure with  related diastolic dysfunction.   In January, she had a spine stimulator placed by Dr. Noel Gerold for severe  lumbosacral spine disease.  St. Jude was there to be certain that there  was no interaction between the spine stimulator and her pacemaker,  especially since she is pacer-dependent.   She seems to be doing fairly well from a heart standpoint.  She says she  may have had a slight bit of fluid, but the shortness of breath has not  been a problem and she has not been aware of any palpitations.   She says she is some better after the spine stimulator.   PAST MEDICAL HISTORY:  1. Normal coronary angiography in 2006.  2. Hyperlipidemia.   CURRENT MEDICATIONS:  Micardis, Coumadin, and Lasix.   PHYSICAL EXAMINATION:  VITAL SIGNS:  Blood pressure 149/75, pulse 60 and  regular.  NECK:  There was no venous distension.  The carotid pulses were full  without bruits.  CHEST:  Clear without rales or rhonchi.  CARDIAC:  The cardiac rhythm was regular.  I could hear no murmurs or  gallops.  ABDOMEN:  Protuberant.  Soft with normal bowel sounds.  There was no  hepatosplenomegaly.  EXTREMITIES:  There was trace peripheral edema.   IMPRESSION:  1. Sick sinus syndrome with chronic atrial fibrillation, status post      atrioventricular nodal ablation.  2. Status post generator change with a Victory VVI St. Jude pacemaker      implanted in  September of 2006.  3. History of normal coronary angiography.  4. History of congestive heart failure with related diastolic      dysfunction.  5. Severe lumbar spine disease, status post recent spine stimulator      implantation.   RECOMMENDATIONS:  I think Carrie [name] is doing fairly well from a  cardiac standpoint.  She may have some minimal volume overload, and I  told her it was okay to take an extra half of an 80 mg Lasix p.r.n.  We  will get a BMP and CBC on her today.  I told her if she takes an extra  half a tablet on a regular basis, we will need to recheck her potassium  and she might need supplementation.  She plans to get her spine  stimulator reprogrammed, and it is important that we arrange for a St.  Jude pacemaker representative to be there to make sure there is no  interaction with her pacemaker since she is pacer-dependent.  I gave her  Marcia's phone number today to give to Dr. Noel Gerold so that if he decides  to reprogram her stimulator he can schedule that to be done with the [facility].  Jude representative present.  I will plan to see Carrie [name] back in  six months for followup.     Bruce Elvera Lennox Juanda Chance, MD, [facility]  Electronically Signed    BRB/MedQ  DD: 12/31/2006  DT: 12/31/2006  Job #: [id number]   cc:   Sharolyn Douglas, M.D.  Richard [facility]

## 2011-01-21 NOTE — Assessment & Plan Note (Signed)
[facility]                            CARDIOLOGY OFFICE NOTE   Carrie [name], Carrie [name]                      MRN:          [id number]  DATE:02/21/2008                            DOB:          1920-07-16    IDENTIFICATION:  Carrie [name] is an 77 year old woman who presents  today. She is normally followed by Carrie [name].  She called in because  she was feeling very short of breath for several days. She has not slept  well.  She has to prop herself up on the sofa.  She denies chest pain.  Has not changed her medicines. Denies any change in her eating habits.  Again, worse over the 4-5 days. No cough.  No fevers.   CURRENT MEDICINES:  1. Include Micardis 80.  2. Coumadin as directed.  3. Lasix 80.   PHYSICAL EXAM:  The patient is in no distress.  At rest, blood pressure  is 146/80, pulse is 68 and regular, weight 187.  This is up from 178 a  year ago.  LUNGS:  Her lungs are relatively clear.  No wheezes or rales.  CARDIAC:  Regular rate and rhythm, S1-S2, no definite S3.  ABDOMEN: Abdomen is benign, obese.  EXTREMITIES:  No edema.   A 12-lead EKG shows ventricular pacing.   IMPRESSION:  Carrie [name] is 9 and comes in with significant shortness  of breath.  Her pacemaker rate was adjusted some, but I am not convinced  this is explaining things.  She has an echocardiogram that showed normal  LV function back in December.   She also had a Myoview done, but this was remote in 2006, with some  breast attenuation, small degree of ischemia cannot be excluded.   I will check labs today of a BMET, BMP.  Also, check again the  echocardiogram.   Have the patient's chart sent to Dr. Juanda Chance.  I told her to take an  additional Lasix 40 tonight and I will be in touch with her tomorrow.     Pricilla Riffle, MD, [facility]  Electronically Signed    PVR/MedQ  DD: 02/21/2008  DT: 02/21/2008  Job #: [id number]   cc:   Everardo Beals. Juanda Chance, MD, [facility]

## 2011-01-21 NOTE — Assessment & Plan Note (Signed)
[facility]                            CARDIOLOGY OFFICE NOTE   Carrie [name], Carrie [name]                      MRN:          [id number]  DATE:08/10/2007                            DOB:          [birth year]    PRIMARY CARE PHYSICIAN:  Dr. Julieanne Manson in [place].   PAST MEDICAL HISTORY:  Carrie [name] is 77 years old and returns for  management of her pacemaker and congestive heart failure.  She has sick  sinus syndrome with chronic atrial fibrillation and has a Victory DDI  St. Jude pacemaker in place.  She has also had AV nodal ablation.  She  also has a history of diastolic heart failure.   She says she has had no chest pain or palpitations, but she does get  short of breath with very minimal activity.  She thinks this most  probably is related to deconditioning related to her low back and knee  problems, and inability to be very active.  She has had some increased  edema, but she says it has been a little bit better recently.   PAST MEDICAL HISTORY:  Significant for lumbar spine disease and she has  had a spine stimulator put in place and we had her pacemaker checked to  make sure there was no interaction.  She says this has helped some, but  not greatly.  She also has had recent arthropathy of her right knee and  this has caused some edema in her right lower extremity.  She had normal  coronary angiography in 2006 and she has hyperlipidemia.   CURRENT MEDICATIONS:  Micardis.  Coumadin.  Lasix.   EXAMINATION:  The blood pressure was 133/70 and pulse 70 and regular.  Venous pulsation was visible 2 cm above the clavicle.  The carotid  pulses were full.  CHEST:  Clear without rales or rhonchi.  CARDIAC:  Rhythm was regular.  I could hear no murmurs or gallops.  ABDOMEN:  Soft with normal bowel sounds.  There was 1+ edema to the right lower extremity and trace edema to the  left lower extremity.  Pedal pulses were equal.   An electrocardiogram  showed ventricular pacing.  Her pacemaker  interrogation showed good threshold on the ventricular lead.  She has  some modest rate response.  She is pacer dependent.   IMPRESSION:  1. Sick sinus syndrome with chronic atrial fibrillation status post      atrioventricular node ablation.  2. Status post generator change with a Victory DDI St. Jude pacemaker      implanted September 2006.  3. Diastolic heart failure with borderline volume status.  4. Shortness of breath with exertion, probably related mostly to      deconditioning with possible some component of diastolic heart      failure.  5. Severe lumbar spine disease.  6. Degenerative disease to the right knee.   RECOMMENDATIONS:  I suspect Carrie [name]'s symptoms are mainly related  to deconditioning.  She has borderline volume status with elevated neck  veins and edema only of the right lower extremity, which is probably  related to her knee surgery.  We will get a BMP and a BNP, and a CBC,  and a TSH, then we will get a 2D echo to reevaluate left ventricular  function.  I will probably leave her medications the same, but if her  BNP is high and her renal function is normal, we might consider  adjusting her diuretic dose up slightly.  I will plan to see her back in  6 months.     Bruce Elvera Lennox Juanda Chance, MD, [facility]  Electronically Signed    BRB/MedQ  DD: 08/10/2007  DT: 08/10/2007  Job #: [id number]

## 2011-01-21 NOTE — Patient Instructions (Signed)
Please increase your lasix to 80 mg in the Am and 40 mg in the PM. Take xanax 1/2 tab at night as needed for shortness of breath. Try TED hose.  Please call us if you have new issues that need to be addressed before your next appt.  We will call you for a follow up Appt. In 2 weeks

## 2011-01-22 ENCOUNTER — Encounter: Payer: Medicare Other | Admitting: Emergency Medicine

## 2011-01-22 ENCOUNTER — Other Ambulatory Visit: Payer: Self-pay | Admitting: *Deleted

## 2011-01-22 MED ORDER — BENAZEPRIL HCL 40 MG PO TABS: 40.0000 mg | ORAL_TABLET | Freq: Every day | ORAL | Status: DC

## 2011-01-24 NOTE — Discharge Summary (Signed)
NAME:  Carrie [name], Carrie [name]                         ACCOUNT NO.:  [id number]   MEDICAL RECORD NO.:  [id number]                   PATIENT TYPE:  INP   LOCATION:  3739                                 FACILITY:  [facility]   PHYSICIAN:  Doylene Canning. Ladona Ridgel, M.D.               DATE OF BIRTH:  01/31/1920   DATE OF ADMISSION:  09/29/2003  DATE OF DISCHARGE:                           DISCHARGE SUMMARY - REFERRING   PROCEDURES:  Adenosine Cardiolite on October 03, 2003.   REASON FOR ADMISSION:  Please refer to dictated admission note.   LABORATORY DATA:  Sodium 135, potassium 3.8, glucose 107, BUN 18, and  creatinine 0.7 at discharge.  Serial cardiac markers:  Peak CPK 252, normal  MB, and normal troponin I markers.  BNP 176.  Lipid profile:  Total  cholesterol 157, triglycerides 66, HDL 59, LDL 55, cholesterol/HDL 2.7.   Admission CXR:  Cardiomegaly, mild congestion, permanent pacemaker present.   HOSPITAL COURSE:  The patient was admitted for evaluation of chest pain and  dyspnea in the context of known permanent atrial fibrillation, status post  AV nodal ablation/permanent pacemaker implantation on chronic Coumadin, and  question of LV dysfunction.   Serial cardiac markers were negative for myocardial ischemia.   The 2-D echocardiogram revealed preserved left ventricular function (EF 55%)  with mild RV dysfunction and a small pericardial effusion.   The patient was treated with IV Lasix diuretic regimen and lost  approximately 14 pounds by the time of discharge on hospital day #5.   The patient was treated with empiric antibiotics (Z-Pak) for antecedent  complaint of fever and chills.  She remained afebrile during her hospital  course, however.  Blood and urine culture were negative.   The patient improved clinically and was referred for cardiac workup with an  adenosine Cardiolite prior to discharge.  This revealed no evidence of  ischemia with a calculated ejection fraction of 61%.   Coumadin was resumed prior to discharge and the patient will continue  Coumadin management with her primary care physician following discharge.   MEDICATION ADJUSTMENTS THIS ADMISSION:  Up titration of Lasix to b.i.d.  dosing, supplemental potassium, and Ambien p.r.n.   MEDICATIONS AT DISCHARGE:  1. Lasix 40 mg b.i.d.  2. K-Dur 20 mEq daily.  3. Ambien 5 mg q.h.s. p.r.n.  4. Coumadin 2.5 mg daily.  5. Micardis 80 mg daily.  6. HCTZ 25 mg daily.   DISCHARGE INSTRUCTIONS:  Refrain from added salt.  Daily weights.  Take  extra 40 mg of Lasix if weight increases by 4 pounds or more from baseline.   The patient is scheduled to follow up with Charlies Constable, M.D., on Friday,  October 20, 2003, at 10:45 a.m.   DISCHARGE DIAGNOSES:  1. Congestive heart failure.     a. Secondary to diastolic dysfunction.  2. Permanent atrial fibrillation.     a. Chronic Coumadin.  3. Status post atrioventricular nodal  ablation/permanent pacemaker     implantation.  4. Recent urinary tract infection.      Gene Serpe, P.A. [facility]                      Doylene Canning. Ladona Ridgel, M.D.    GS/MEDQ  D:  10/04/2003  T:  10/04/2003  Job:  [id number]   cc:   Julieanne Manson  [address]  [place]  [place] [address]  Fax: [phone]

## 2011-01-24 NOTE — Assessment & Plan Note (Signed)
[facility]                            CARDIOLOGY OFFICE NOTE   Carrie [name], Carrie [name]                      MRN:          [id number]  DATE:08/10/2006                            DOB:          [birth year]    CLINICAL HISTORY:  Carrie [name] is 77 years old and is here for final  clearance prior to neurosurgery on her lumbar spine.  Dr. Sharolyn Douglas  plans on placing an implantable stimulator for pain control.   Carrie [name] has sick sinus syndrome and now chronic atrial fibrillation  and has a Victory [phone] pacemaker programmed to VVI.  She also has had AV  nodal ablation.  She also has had a history of congestive heart failure  related to diastolic dysfunction.  She was recently seen by Wende Bushy for some slight volume overload and her Lasix was adjusted from 60  mg to 80 mg.   She feels like she is doing fairly well from a cardiac standpoint now  and has had no excess swelling and has had no chest pain or shortness of  breath.   PAST MEDICAL HISTORY:  Significant for normal coronary angiography in  2006 and for lumbar spine disease; she also has hyperlipidemia.   CURRENT MEDICATIONS:  1. Micardis 80 mg daily.  2. Pravachol 20 mg daily.  3. Coumadin as directed.  4. Lasix 80 mg daily.   PHYSICAL EXAMINATION:  The blood pressure is 138/80 and the pulse 69 and  regular.  There was no venous distention.  The carotid pulses were full without  bruits.  CHEST:  Clear without rales or rhonchi.  CARDIAC:  Rhythm was regular.  I could hear no murmurs or gallops.  ABDOMEN:  Soft without organomegaly.  Peripheral pulses were full and there was trace to 1+ peripheral edema.  She felt numbness in both feet and had absent ankle reflexes and was  hyporeflexic at the knees.   IMPRESSION:  1. Sick sinus syndrome with chronic atrial fibrillation, status post      atrioventricular node ablation.  2. Status post recent generator change with a Victory VVI St.  Jude      pacemaker, September 2006, pacer dependent.  3. History of normal coronary angiography.  4. History of congestive heart failure related to diastolic      dysfunction.  5. Severe lumbar spine disease with surgery scheduled for January.  6. Burning and numbness of the feet, suspect peripheral neuropathy.   RECOMMENDATIONS:  I think Carrie [name] is doing alright from the  standpoint of her heart.  Her volume status appears good.  We will get a  BMP to reevaluate her increased Lasix dose.  I think she is okay for  surgery in January.  I think her cardiac risk will only be slightly  increased.  She should stop her Coumadin 5 days before the surgery.  Since there is a potential for interaction between the nerve stimulator  and her pacemaker, we will arrange for the pacer representative to be  present at the time of implantation.  I had talked to Kerry Fort about  this and he can be reached at [phone] through his answering  service.  I will ask Carrie [name] and Dr. Noel Gerold to let us know the date  of admission and we will also be in touch with Kerry Fort about  arranging him or one of his people to be present.  I think Carrie [name]  may also have peripheral neuropathy and we will plan to get a fasting  hemoglobin A1c and blood sugar and arrange for a neurologic consult.  I  will have her follow up in 4 months here.     Bruce Elvera Lennox Juanda Chance, MD, [facility]  Electronically Signed    BRB/MedQ  DD: 08/10/2006  DT: 08/11/2006  Job #: [id number]

## 2011-01-24 NOTE — Discharge Summary (Signed)
NAMEPAISLYN, [name] NO.:  [id number]   MEDICAL RECORD NO.:  [id number]          PATIENT TYPE:  OIB   LOCATION:  6523                         FACILITY:  [facility]   PHYSICIAN:  Maple Mirza, P.A. DATE OF BIRTH:  July 02, 1920   DATE OF ADMISSION:  04/15/2005  DATE OF DISCHARGE:  04/16/2005                                 DISCHARGE SUMMARY   ADDENDUM TO DISCHARGE SUMMARY:   LABORATORY DATA:  Complete blood count on August 9, white cells 6.1,  hemoglobin 12.3, hematocrit 35.3, platelets 267,000.  PT was 68.2, INR 1.3.  She starts Coumadin tonight.  Sodium 131, potassium 4.2, chloride 100,  carbonate 25, glucose 97, BUN 10, creatinine 0.7.  Magnesium on admission  yesterday, August 8, was 2.3.  TSH was obtained on August 8 with 2.079, and  BNP on August 8 was 200.5.       GM/MEDQ  D:  04/16/2005  T:  04/16/2005  Job:  [id number]   cc:   Georgiana Spinner, M.D.  [address]  [place]  [place] [address]  Fax: [phone]

## 2011-01-24 NOTE — Op Note (Signed)
NAME:  Carrie [name], Carrie [name]                         ACCOUNT NO.:  [id number]   MEDICAL RECORD NO.:  [id number]                   PATIENT TYPE:  INP   LOCATION:  3711                                 FACILITY:  [facility]   PHYSICIAN:  Adolph Pollack, M.D.            DATE OF BIRTH:  01-17-1920   DATE OF PROCEDURE:  02/03/2004  DATE OF DISCHARGE:                                 OPERATIVE REPORT   PREOPERATIVE DIAGNOSIS:  Incarcerated ventral hernia.   POSTOPERATIVE DIAGNOSIS:  Incarcerated ventral hernia.   PROCEDURE:  1. Diagnostic laparoscopy.  2. Repair of incarcerated ventral hernia with AlloDerm graft/mesh.   SURGEON:  Adolph Pollack, M.D.   ANESTHESIA:  General.   INDICATIONS:  This 77 year old female woke this morning with a very painful  bulge in the epigastric region superior to the umbilicus.  She had some  nausea and retching with it.  She presented to the emergency department in  severe pain. I was able to partially reduce the hernia in the emergency  department, but x-rays were consistent with ileus but partial small-bowel  obstruction.  Despite reduction she remained very tender.  She was brought  to the operating room for diagnostic laparoscopy to see if there was any  nonviable bowel and repair of the hernia.   TECHNIQUE:  She was placed supine on the operating table and a general  anesthetic was administered.  Abdominal wall was sterilely prepped and  draped.  A small, supraumbilical vertical incision was made through the skin  and subcutaneous tissue.  The hernia was identified and its contents reduced  back into the peritoneal cavity. I exposed the fascia all around the hernia  with electrocautery. I then excised the hernia sac.  I then inserted a  Hasson trocar and a pneumoperitoneum was created.   The laparoscope was introduced.  I looked directly underneath the hernia and  around it and saw 1 small segment of small bowel which appeared slightly  inflamed,  but it was viable.  I then removed the trocar and enlarged the  fascia defect a little bit and grasped the small bowel and examined it  myself and it again appeared viable.   I primarily closed the hernia defect with interrupted #0 Novofil sutures.  I  used a piece of AlloDerm mesh/graft as an onlay mesh and anchored it to the  fascia with a running 2-0 Prolene suture.   I then irrigated out the wound and injected some Marcaine solution for local  anesthetic effect.  Hemostasis was adequate.  The subcutaneous tissue was  closed with open mesh with a running 3-0 Vicryl suture.  There was some  small abrasions/cuts to the umbilicus and these were from the initial  incision and I closed these with simple 4-0  Monocryl sutures.  I then closed the skin wound with staples.  Sterile  dressing was applied.  She tolerated the procedure well without any  apparent  complications.  She was taken to the recovery room in satisfactory condition  and will be placed on a telemetry bed given her history of atrial  fibrillation, although she is in sinus rhythm now.                                               Adolph Pollack, M.D.    Kari Baars  D:  02/03/2004  T:  02/04/2004  Job:  [id number]

## 2011-01-24 NOTE — Op Note (Signed)
[name], Carrie [name] NO.:  [id number]   MEDICAL RECORD NO.:  [id number]          PATIENT TYPE:  AMB   LOCATION:  SDS                          FACILITY:  [facility]   PHYSICIAN:  Sharolyn Douglas, M.D.        DATE OF BIRTH:  [birth year]   DATE OF PROCEDURE:  12/04/2005  DATE OF DISCHARGE:  12/04/2005                                 OPERATIVE REPORT   DIAGNOSIS:  1.  Lumbar spinal stenosis.  2.  Lumbar osteoporosis, history of multiple compression fractures.  3.  Lumbar spondylosis.   POSTOPERATIVE DIAGNOSIS:  1.  Lumbar spinal stenosis.  2.  Lumbar osteoporosis, history of multiple compression fractures.  3.  Lumbar spondylosis.   PROCEDURE:  1.  Epidural steroid injection, L4-5.  2.  Radiographic interpretation of fluoroscopic images used for epidural      steroid injection.   SURGEON:  Sharolyn Douglas, M.D.   ASSISTANT:  None.   ANESTHESIA:  Conscious station plus local.   COMPLICATIONS:  None.   INDICATIONS:  The patient is a pleasant 77 year old female with chronic pain  in her back which recently worsened after subacute compression fracture.  These were treated with kyphoplasty.  She is significantly better after the  kyphoplasty procedures. However, she still has significant back pain.  She  has elected at this time to undergo epidural steroid injection in hopes of  improving some of her symptoms.  The risks and benefits were reviewed.   PROCEDURE:  The patient was identified in the holding. She was taken to the  operating room.  Monitored anesthesia care was delivered by the anesthesia  service.  She was turned prone onto the Hague table with pillows.  Her  back was prepped and draped in the usual sterile fashion.  Fluoroscopy was  brought into the field, and the L4-5 interspace was identified.  Then, 3 cc  of 1% lidocaine with epinephrine used to anesthetize the skin.  A 17-gauge  Tuohy needle was then advanced under fluoroscopic guidance towards the L4-5  interspace.  Using the loss-of-resistance technique, the Tuohy needle was  advanced into the epidural space.  Withdrawal of the syringe showed no CSF  and blood.  One cc of Omnipaque then injected, and there was good epidural  spread of the contrast material and no intravascular uptake.  Eighty mg of  Depo-Medrol and 3 cc of 1% lidocaine then injected into the epidural space.  The Tuohy needle was withdrawn, and it was placed over the puncture site.  The patient was turned supine and transferred to recovery in stable  condition.  Neurologic exam was intact.  She was discharged and will follow  up two weeks.      Sharolyn Douglas, M.D.  Electronically Signed     MC/MEDQ  D:  12/05/2005  T:  12/08/2005  Job:  [id number]

## 2011-01-24 NOTE — Discharge Summary (Signed)
Carrie [name], Carrie [name] NO.:  [id number]   MEDICAL RECORD NO.:  [id number]          [name] TYPE:  OIB   LOCATION:  6523                         FACILITY:  [facility]   PHYSICIAN:  Charlies Constable, M.D. [facility] DATE OF BIRTH:  [birth year]   DATE OF ADMISSION:  04/15/2005  DATE OF DISCHARGE:  04/16/2005                                 DISCHARGE SUMMARY   ALLERGIES:  1.  DEMEROL.  2.  SULFA.   DISCHARGE DIAGNOSES:  1.  Discharging day one, status post left heart catheterization by Dr. Charlies Constable.  Finding of no evidence of coronary artery disease, an ejection      fraction of 50%.  2.  Discharging day one, status post generator change, an explant of a St.      Jude Synchrony II with implant of St. Jude Victory SR [id number].  3.  History of permanent pacemaker placed at Carrie time of A-V node ablation      in setting of persistent atrial fibrillation (Carrie [name] is pacemaker      dependent).  4.  Finding of significant numbers of premature ventricular tachycardia on      electrocardiogram at office visit, April 01, 2005.  5.  Adenosine Cardiolite study, April 04, 2005, with demonstration of mildly      reversible small anteroseptal defect, a small degree of ischemia and      this region could not be unequivocally excluded.  Carrie [name], hence,      with new finding of premature ventricular contractions and with      questionable adenosine Cardiolite scheduled for left heart      catheterization on April 15, 2005.   SECONDARY DIAGNOSES:  1.  Congestive heart failure, class II to III, secondary to diastolic      dysfunction.  2.  Hypertension.  3.  History of atrial fibrillation, controlled ventricular rate.   PROCEDURES:  1.  April 15, 2005, left heart catheterization.  Carrie study shows no evidence      of coronary artery disease, ejection fraction 50%, Dr. Charlies Constable.  2.  April 15, 2005, explantation of St. Jude Synchrony II pacemaker which is      at elective  replacement indicator and implantation of St. Jude Victory      SR [id number] pacemaker set to Carrie VVI mode.   DISCHARGE DISPOSITION:  Carrie [name] discharging day #1, after left heart  catheterization and after a generator change of her St. Jude pacemaker.  Carrie  [name] is pacemaker dependent, and she is V pacing on telemetry.  She has  no complaints of pain or swelling at Carrie pacemaker pocket.  Carrie incision is  healing nicely.  She has had no sustained cardiac dysrhythmias.  No  respiratory distress.  She has complained of some pain at Carrie pacemaker  pocket site and will be treated with oral analgesia at discharge.  She will  also receive Keflex antibiotics for Carrie next five days since this is a  device change.  Carrie [name] discharging, April 15, 2005, with a low sodium,  low cholesterol diet.  She is asked not to drive for Carrie next two days.  She  is asked to keep her incision dry for Carrie next seven days and sponge bathe  until Tuesday, April 22, 2005.   1.  Pain.  Percocet 5/325 one to two tabs every four to six hours.  2.  Micardis 80 mg daily.  3.  Lasix 40 mg tablets, one and one half tablets daily.  4.  Pravachol 20 mg tablets, three times a week.  5.  Resume Coumadin at her regular dose.  6.  Toprol XL 25 mg daily.  7.  Keflex 500 mg one tab four times a day.   FOLLOW UP:  [facility], [address].  1.  [facility], Wednesday, April 30, 2005 at 9:45.  2.  She is to keep her appointment that she has scheduled for Carrie week of      April 21, 2005 at [facility], [facility].  3.  She will see Dr. Juanda Chance, Thursday, May 08, 2005 at 11.   BRIEF HISTORY:  Carrie [name] is an 77 year old female.  She has chronic  atrial fibrillation.  She has had A-V node ablation with concurrent  pacemaker implantation.  This is a Synchrony DDD pacemaker programmed to Carrie  VVI mode.  She was doing fairly well.  She has no shortness of breath.  She  has episodes  where she feels like her heart is fluttering and then has a  funny weak feeling all over.  This lasts maybe an hour and occurs maybe  three times a week.  It feels like it is related to a rapid heart beat.  Carrie  [name]'s electrocardiogram has underlying atrial fibrillation with  ventricular pacing and she is in trigemini with premature ventricular  tachycardia on every third beat.  Carrie plan will be for an adenosine Myoview  study, a 2D echocardiogram.  Based on Carrie results of these studies, Carrie  [name] may need left heart catheterization.  Her pacemaker is at an  elective replacement indicator and this will also be changed out electively.  Addendum:  As it turns out, Cardiolite stress study, done on April 04, 2005,  was equivocal for ischemia.  So, Carrie [name] was scheduled for a left heart  catheterization as well as for pacemaker generator change.   HOSPITAL COURSE:  Carrie [name] presented electively, April 15, 2005.  She  underwent left heart catheterization with finding of no significant coronary  artery disease, ejection fraction 50%.  Then at Carrie same procedure, she had  her St. Jude Synchrony II pacemaker replaced with a Naval architect.  Carrie [name] has had no post procedural complications.  Discharged with Carrie medications and followup as dictated above.      Debbora Lacrosse   GM/MEDQ  D:  04/16/2005  T:  04/16/2005  Job:  [id number]   cc:   Julieanne Manson  [address]  [place]  [place] [address]  Fax: [phone]   Charlies Constable, M.D. [facility]  [address]  [address]  [place]  [place] [address]

## 2011-01-24 NOTE — H&P (Signed)
NAME:  Carrie [name], Carrie [name]                         ACCOUNT NO.:  [id number]   MEDICAL RECORD NO.:  [id number]                   PATIENT TYPE:  INP   LOCATION:  3711                                 FACILITY:  [facility]   PHYSICIAN:  Charlies Constable, M.D. [facility]              DATE OF BIRTH:  Feb 24, 1920   DATE OF ADMISSION:  02/02/2004  DATE OF DISCHARGE:                                HISTORY & PHYSICAL   PRIMARY CARE PHYSICIAN:  Dr. Sullivan Lone.   PRESENTING CIRCUMSTANCE:  I have been unable to breathe.   HISTORY OF PRESENT ILLNESS:  Ms. Carrie [name] is an 77 year old female.  She  presented May 27, with epigastric/ abdominal pain/ nausea.  She was found to  have an incarcerated ventral hernia.  She is post-procedure day #3 today,  status post laparoscopy and repair of ventral hernia.  She has had  progressive dyspnea for the last 48 hours, and has been unable to lay flat  for the last two nights.  She herself has noted that her weight has  increased.  The day before admission, it was 178.  The weight today is 193,  an increase of 15 pounds.  The patient is five liters fluid positive for the  last 48 hours.  She has a history of exacerbation of congestive heart  failure (diastolic dysfunction), January of 2005, with concomitant chronic  atrial fibrillation (no atrial kick).  In addition, she has not been on her  usual Lasix dose for the last three days.  She was given Lasix 40 mg IV this  morning, and has had 1.5 liters output in two hours.  The patient is not  complaining of chest pain or diaphoresis.  Her nausea is persistent, but  somewhat better.   She had an echocardiogram in January of 2005.  Ejection fraction was 55%  with finding of small pericardial effusion.  Chest x-ray taken yesterday,  May 30, shows cardiomegaly with increased bibasilar opacities and small  bilateral pleural effusions.   ALLERGIES:  No known drug allergies.   MEDICATIONS:  1. At [facility], Lovenox 40 mg subcu  q.24h., Avapro 300 mg daily,     and albuterol  nebulizers q.6h.  2. At home, however, she was taking Micardis 80 mg daily, Lasix 40 mg daily,     and Coumadin 2 mg daily except for 1 mg on Thursday through Sunday.   PAST MEDICAL HISTORY:  1. Congestive heart failure with exacerbation, January of 2005.  She has     diastolic dysfunction.  2. Chronic atrial fibrillation/ chronic Coumadin, failed DCCV x3.  3. Status post percutaneous transvenous demand pacer in 1995.  4. Status post AV node ablation in 1996.  5. Cardiolite study October 03, 2003, negative for ischemia.  Ejection     fraction is 61%.  6. Hypertension.  7. Diverticulitis.  8. Hospital admission for incarcerated hernia, Feb 02, 2004.   SOCIAL  HISTORY:  The patient lives in [name] with her husband of 63  years.  She has two children who are both alive and well.  She does not  smoke.  She has quit 40 years ago.  No appreciable alcoholic intake.  No  recreational drugs.   FAMILY HISTORY:  Mother died at age 70 of congestive heart failure.  Father  died at age 27 of CVA.   REVIEW OF SYSTEMS:  The patient claims weight gain recently of +15 pounds in  the last 72 hours.  She has not having fever, chills or sweats.  She has had  epistaxis about five days ago prior to this hospitalization, and was told by  her primary care physician to stop taking her Coumadin.  Her INR is still  3.0 three days after abdominal surgery.  INTEGUMENT:  No rashes or lesions.  CARDIOPULMONARY:  The patient has no chest pain. She is short of breath,  dyspnea with exertion.  She has orthopnea but not paroxysmal nocturnal  dyspnea.  She does note increasing edema to the lower extremities and to the  abdominal area.  She is not complaining of palpitations, syncope or  presyncope or claudication.  She does have some increased cough in the last  72 hours.  UROGENITAL:  No history of dysuria.  History of hematuria last  admission in January of 2005.   NEUROPSYCHIATRIC:  No distress or anxiety.  MUSCULOSKELETAL:  No appreciable arthralgias, joint swelling, deformity or  pain.  GASTROINTESTINAL:  She is having persistent nausea and vomiting but  no chronic heartburn, no history of GI bleeding.  ENDOCRINE:  No heat or cold intolerance.  No evidence of thyroid disease.  The patient also denies a prior history of CVA, myocardial infarction,  diabetes, seizure disorder, TIA's, pulmonary embolism, or deep venous  thrombosis.   PHYSICAL EXAMINATION:  GENERAL:  This patient is alert and oriented x3, in  no acute distress.  VITAL SIGNS:  There is 100% oxygen saturation on three liters nasal cannula.  Temperature 97.6, pulse 72, V paced.  Respiration rate 21, blood pressure  144/87.  Oxygen saturation on three liters is 100%.  HEENT:  Eyes:  Pupils equal, round and reactive to light.  Extraocular  movements are intact.  Sclerae are clear.  Nares are patent.  Mucous  membranes are pink and moist without lesion or erythema.  NECK:  Supple, no carotid bruits auscultated, no jugular venous distension  sitting up that is.  No cervical lymphadenopathy.  HEART:  Rhythm is regular, paced, possible S3 is auscultated.  LUNGS:  Crackles at both bases.  INTEGUMENT:  No rashes or lesions.  ABDOMEN:  Tender, hypoactive bowel sounds.  No hepatosplenomegaly.  UROGENITAL:  Rectal exams have been deferred.  EXTREMITIES:  Show 1 to 2+ edema at the ankles of both lower extremities.  MUSCULOSKELETAL:  No joint deformity or fusions.  She has radial pulses 4/4  bilaterally.  Pedal pulses are 3/4 bilaterally.  NEUROLOGIC:  No neurologic deficits noted.   The chest x-ray May 30, cardiomegaly with increased bibasilar opacities,  small bilateral pleural effusions.  Electrocardiogram with rate of 71 with V  pacing, T wave inversions, V1 through V4.  QRS interval is 132.  QTC  interval if 493.  LABORATORY DATA:  Complete blood count on May 27, white cells 9.6,   hemoglobin 14, hematocrit 41.4, platelets 2222.  Serum electrolytes May 30:  Sodium 134, potassium 4.6, chloride 105, bicarbonate 26, BUN 8, creatinine  0.7,  glucose 132.  PT this morning was 24.  INR was 3.0.  Lipase on  admission was 18.  Cardiac enzymes were ordered this morning x1, and they  are pending.   IMPRESSION:  1. Admit with incarcerated ventral hernia status post ventral herniorrhaphy.  2. Exacerbation of congestive heart failure (diastolic dysfunction)     secondary to postoperative hydration/ __________.  3. Chronic atrial fibrillation/ chronic Coumadin.  4. Status post DC cardioversion for atrial fibrillation x3 which all failed.  5. Status post implantation of PTVDP 1995.  6. Status post AV node ablation in 1996.  7. Hypertension.  8. Diverticulitis.  9. Status post hysterectomy, appendectomy.   PLAN:  After discussion with Dr. Valera Castle, continue IV diuresis with  Lasix 40 mg IV q.12h.  Obtain BNP and a TSH.  Cardiac enzymes will not tell  us much, so we are not continuing those.  CK's will probably be elevated  secondary to abdominal surgery and the enzymes troponin I will probably be  elevated in the setting of congestive heart failure.  The patient needs a  PICC line.  She has no peripheral IV access, possibly to repeat 2-D  echocardiogram, repeat chest x-ray in the morning, June 1.  Monitor serial  electrolytes in the face of effective diuresis.      Maple Mirza, P.A.                    Charlies Constable, M.D. [facility]    GM/MEDQ  D:  02/06/2004  T:  02/06/2004  Job:  [id number]

## 2011-01-24 NOTE — Cardiovascular Report (Signed)
NAME:  Carrie [name], Carrie [name] NO.:  [id number]   MEDICAL RECORD NO.:  [id number]          PATIENT TYPE:  OIB   LOCATION:  2899                         FACILITY:  [facility]   PHYSICIAN:  Charlies Constable, M.D. [facility] DATE OF BIRTH:  1920-07-29   DATE OF PROCEDURE:  04/15/2005  DATE OF DISCHARGE:                              CARDIAC CATHETERIZATION   CLINICAL HISTORY:  Mr. Bardwell is 77 years old and has sick sinus syndrome  and atrial fibrillation status post AV nodal ablation and has a Synchrony II  DV pacemaker in place.  She recently had episodes of skipping and weak  spells and did a Cardiolite scan which was borderline abnormal.  She also  reached end-of-life of her pacemaker.  We brought her in today for a cardiac  catheterization and then a generator change if her catheterization was  negative.   PROCEDURE:  The patient procedure was performed via the right femoral artery  using an arterial sheath and #6 [place] __________ coronary catheters.  A  femoral artery arterial puncture was performed and on takedown __________  was used.  The patient tolerated the procedure well and left the laboratory  in satisfactory condition.   RESULTS:  The aortic pressure was 158/87 with mean of 116.  The left  ventricular pressure was 158/17.   The underlying left main coronary artery was free of significant disease.   The left anterior descending artery gave rise to two diagonal branches and  two septal perforators.  These and LV proper were free of significant  disease.   The circumflex artery gave rise to an atrial branch, a small marginal  branch, a large marginal branch and a posterolateral branch.  These vessels  were free of significant disease.   The right coronary artery is a dominant vessel and gave rise to a clonus  branch, right ventricular branch, posterior descending branch, and two  posterolateral branches.  These vessels were free of significant disease.   The left  ventriculogram performed in the RAO projection showed good wall  motion with no areas of hypokinesis.  The ejection fraction was 50%.   CONCLUSION:  Normal coronary angiography and left ventricular wall motion.   RECOMMENDATIONS:  Reassurance.       BB/MEDQ  D:  04/15/2005  T:  04/16/2005  Job:  [id number]   cc:   Julieanne Manson  [address]  [place]  [place] [address]  Fax: [phone]

## 2011-01-24 NOTE — Op Note (Signed)
NAME:  Carrie [name], SUMMONS NO.:  [id number]   MEDICAL RECORD NO.:  [id number]          PATIENT TYPE:  OIB   LOCATION:  5032                         [facility]ACILITY:  [facility]   PHYSICIAN:  Sharolyn Douglas, M.D.        DATE OF BIRTH:  19-Sep-1919   DATE OF PROCEDURE:  09/10/2005  DATE OF DISCHARGE:                                 OPERATIVE REPORT   DIAGNOSIS:  L2 compression fracture.   PROCEDURE:  1.  L2 kyphoplasty.  2.  Radiographic interpretation of fluoroscopic images used for kyphoplasty.   SURGEON:  Sharolyn Douglas, M.D.   ASSISTANT:  None.   ANESTHESIA:  General endotracheal.   COMPLICATIONS:  None.   INDICATIONS:  The patient is a pleasant 77 year old female with severe back  pain felt to be secondary to L2 compression fracture.  She has had a bone  scan with increased uptake.  CT scan shows L2 superior endplate fracture.  She has elected to undergo kyphoplasty in hopes of improving her deformity  and pain.   PROCEDURE:  She was identified in the holding area and taken to the  operating room, underwent general endotracheal anesthesia without  difficulty, given prophylactic IV antibiotics, carefully turned prone on the  Wilson frame, all bony prominences padded, face and eyes protected at all  times, back prepped and draped in usual sterile fashion.  Biplanar  fluoroscopy was brought into the field.  True AP and lateral images of the  L2 vertebral body were obtained.  A small stab incision was made just  lateral of the right L2 pedicle.  A Jamshidi needle was used to cannulate  the L2 pedicle.  A deep bone biopsy was taken.  A working cannula was  established.  The intervertebral bone tamp was placed into the vertebral  body and inflated to 4 mL.  We observed a partial reduction of the vertebral  endplate.  Working cannula was removed and partially hardened methyl  methacrylate cement pushed into the vertebral body under live AP and lateral  fluoroscopy.  We had a good cement  fill.  Working cannula was removed.  A  single nylon suture was utilized to close the small stab incision.  Sterile  dressing was applied.  The patient was turned supine, extubated without  difficulty and transferred to Recovery in stable condition, able to move her  upper and lower extremities.      Sharolyn Douglas, M.D.  Electronically Signed     MC/MEDQ  D:  09/10/2005  T:  09/11/2005  Job:  [id number]

## 2011-01-24 NOTE — Discharge Summary (Signed)
NAMELIARA, HOLM NO.:  [id number]   MEDICAL RECORD NO.:  [id number]          PATIENT TYPE:  INP   LOCATION:  3711                         FACILITY[facility]  [facility]   PHYSICIAN:  Adolph Pollack, M.D.DATE OF BIRTH:  [birth year]   DATE OF ADMISSION:  02/02/2004  DATE OF DISCHARGE:  02/10/2004                                 DISCHARGE SUMMARY   REASON FOR ADMISSION:  Ms. Carrie [name] is an 77 year old female in her normal  state of health until she awoke in the morning with an acutely painful bulge  in the supraumbilical region.  She presented to [facility], and they felt she had an incarcerated, possibly strangulated hernia.  She subsequently was sent to the emergency department where she was in  severe pain and had nausea and dry heaves.  No fever or chills.  She had no  history of previous umbilical hernia repair.  I was subsequently asked to  see her and noted she had incarcerated, questionably strangulated umbilical  hernia.  She had atrial fibrillation on chronic anticoagulation; however,  INR was 1.7.  She subsequently was admitted.   HOSPITAL COURSE:  She was admitted and underwent emergency diagnostic  laparoscopy and repair of ventral hernia with AlloDerm graft.  Postoperatively, she had some pain and nausea and some dry heaves.  She was  not taking her pain medicine well.  She had some urinary retention.  Waited  for the ileus to improve.  She subsequently had some difficulty breathing  and substernal chest tightness.  Cardiology consultation was obtained, and  it was felt that she had some volume overload secondary to fluid after  surgery which was needed for some resuscitation.  She subsequently was  treated with diuresis.  Her ileus slowly improved as did her heart failure.  She was given a laxative and started moving her bowels.  She did have a  chronic cough and epistaxis, and requested an ENT consultation which was  obtained by Dr.  Serena Colonel.   By her 10th postoperative day, she was improved from all standpoints.  Incision was clean and intact, and she was ready for discharge.   DISPOSITION:  Discharged to home February 10, 2004.  The ENT consultation was for  coughing and epistaxis; however, nothing needed to be done, and followup was  p.r.n. with Dr. Pollyann Kennedy. She is going to follow up with Dr.  Juanda Chance in two weeks in the [facility] as her Coumadin was restarted.  I  gave her some activity restrictions, and she is going to follow up with me  in two weeks.  She was discharged in satisfactory condition and was told to  resume her home medications and take Darvocet for pain.       TJR/MEDQ  D:  07/01/2004  T:  07/01/2004  Job:  [id number]   cc:   Julieanne Manson, M.D.  [place], [place]   Charlies Constable, M.D. [facility]   Jefry H. Pollyann Kennedy, MD  [phone] [address] [place]  [place] [address]  Fax: [phone]

## 2011-01-24 NOTE — Assessment & Plan Note (Signed)
[facility]                              CARDIOLOGY OFFICE NOTE   Carrie, [name]                      MRN:          [id number]  DATE:05/01/2006                            DOB:          07-20-1920    REFERRING PHYSICIAN:  Sharolyn Douglas, MD   CARDIAC CONSULTATION NOTE:   PRIMARY CARE PHYSICIAN:  Julieanne Manson, M.D.   REASON FOR CONSULTATION:  Preoperative evaluation prior to back surgery.   CLINICAL HISTORY:  Carrie [name] is 77 years old and is referred today for a  preoperative evaluation prior to back surgery.  She has severe lumbar spine  disease, and Dr. Noel Gerold is considering a nerve stimulator to her back.   [name] has sick sinus syndrome with atrial fibrillation which is now  chronic.  She has had AV nodal ablation, and she had a generator placement  in September 2006 with a St. Jude Victory VVI pacemaker.  She is pacer  dependent.   She also has a history of congestive heart failure related to diastolic  dysfunction that has been controlled with diuretics.   She has been doing fairly well from the standpoint of her heart.  She has  had no chest pain, minimal shortness of breath, and no edema.  She does have  a history of normal coronary angiography.   PAST MEDICAL HISTORY:  1. Hypertension.  2. Hyperlipidemia.   CURRENT MEDICATIONS:  1. Micardis 80 mg daily.  2. Pravachol 20 mg daily.  3. Coumadin.  4. Lasix 80 mg daily.   SOCIAL HISTORY:  She is married, and she is getting ready to move to a  retirement facility in [place].  She does not smoke.  She does not drink.   FAMILY HISTORY:  Positive in that she has a mother who died of heart failure  and a sister who has heart failure.  She also has a father who died of a  stroke.   REVIEW OF SYSTEMS:  Positive for back pain related to her degenerative  disease of her lumbar spine.   PHYSICAL EXAMINATION:  VITAL SIGNS:  The blood pressure was 138/82, and the  pulse  70 and regular.  NECK:  There was no venous distention.  The carotid pulses were full,  without bruits.  CHEST:  Clear.  CARDIAC:  Rhythm was regular.  Had no murmurs, rubs, or gallops.  ABDOMEN:  Soft, without organomegaly.  EXTREMITIES:  Peripheral pulses were full, and there was trace peripheral  edema.  MUSCULOSKELETAL:  Showed no deformities.  The skin was warm and dry.  NEUROLOGIC:  No focal neurological signs.   IMPRESSION:  1. Preoperative evaluation prior to back surgery.  2. Sick sinus syndrome, with chronic atrial fibrillation, status post      atrioventricular nodal ablation.  3. Status post recent generator change, with implantation of a Victory VVI      St. Jude pacemaker, September 2006 - pacemaker dependent.  4. History of normal coronary angiography.  5. History of congestive heart failure related to diastolic dysfunction,      now compensated.  6.  Multiple fractures of the lumbar spine.   RECOMMENDATIONS:  I think [name] risk for back surgery from a  cardiac standpoint will not be greatly increased.  She does not have  coronary artery disease, and her LV function is good.  She is pacer  dependent, and there is the potential for interaction with her nerve  stimulator and the pacemaker.  I talked with Kerry Fort at [facility] today,  and he indicated that it is very unusual to have any interaction from a  nerve stimulator and the pacemaker.  They would like to be present at the  time of operation to ensure that there is no interaction and to program the  pacemaker appropriately if needed.  Kerry Fort can be reached at [phone] through his answering service.  I will recommend that Dr. Noel Gerold contact  him to let him know the date of the surgery so they can be available.  I  think it is safe for [name] to come off Coumadin 5 days before her  surgery.  I do not think her risk of emboli is high enough that she needs to  be overlapped with heparin.   We would also like to be notified when she is  admitted to the hospital so we can monitor her perioperative cardiac care.                               Bruce Elvera Lennox Juanda Chance, MD, [facility]    BRB/MedQ  DD:  05/01/2006  DT:  05/01/2006  Job #:  [id number]   cc:   Sharolyn Douglas, MD

## 2011-01-24 NOTE — Op Note (Signed)
NAME:  Carrie [name], STEGMAN NO.:  [id number]   MEDICAL RECORD NO.:  [id number]          PATIENT TYPE:  AMB   LOCATION:  [facility]                         FACILITY:  [facility]   PHYSICIAN:  Sharolyn Douglas, M.D.        DATE OF BIRTH:  Sep 22, 1919   DATE OF PROCEDURE:  02/17/2006  DATE OF DISCHARGE:                                 OPERATIVE REPORT   DIAGNOSIS:  Degenerative scoliosis, spinal stenosis and degenerative disk  disease.   PROCEDURE PERFORMED:  1.  Bilateral L5 transforaminal epidural steroid injection.  2.  Fluoroscopic imaging used for needle placement during L5 transforaminal      epidural injection.   SURGEON:  Sharolyn Douglas, M.D.   ASSISTANT:  None.   ANESTHESIA:  MAC plus local.   COMPLICATIONS:  None.   BLOOD LOSS:  None.   INDICATIONS FOR PROCEDURE:  The patient is a pleasant 77 year old female  with chronic back and bilateral lower extremity pain related to degenerative  scoliosis, underlying spinal stenosis.  She has failed to respond to other  conservative treatment and now elects to undergo bilateral L5 selective  nerve root blocks in hopes of improving her symptoms.  The risks and  benefits were reviewed.   DESCRIPTION OF PROCEDURE:  The patient was identified in the holding area  and taken to the operating room, underwent sedation by Anesthesia, turned  prone, back prepped and draped in the usual sterile fashion.  Using oblique  fluoroscopy, the L5 pedicles were identified.  22 gauge Quincke spinal  needle placed in the 6 o'clock position under the L5 pedicle starting on the  left side.  1 mL of Omnipaque injected.  There was good epidural spread.  Aspiration showed no blood or CSF.  A solution of 3 mL of 1% preservative  free lidocaine and 80 mg of Depo-Medrol injected.  A similar procedure was  then carried out on the right side.  Again, we had good epidural spread of  the contrast material and there was no blood with aspiration.  These spinal  needles  were removed.  Band-Aids placed.  The patient was turned supine,  transferred to recovery in stable condition, neurologically intact.  Post  injection instructions were reviewed.  She will follow up in two weeks.  If  she has any problems in the interim, she will call the office.      Sharolyn Douglas, M.D.  Electronically Signed    MC/MEDQ  D:  02/17/2006  T:  02/17/2006  Job:  [id number]

## 2011-01-24 NOTE — Assessment & Plan Note (Signed)
[facility]                              CARDIOLOGY OFFICE NOTE   Carrie, [name]                      MRN:          [id number]  DATE:07/07/2006                            DOB:          1920/02/26    This is a patient of Dr. Charlies Constable.   Carrie [name] is a very pleasant 77 year old married white female patient of  Dr. Charlies Constable, Dr. Julieanne Manson, and Dr. Sharolyn Douglas who is seen as an  add-on for trouble with recent edema. The patient has a history of  congestive heart failure due to diastolic dysfunction and recently was  moving to a retirement home and had a lot of stress and feels like her blood  pressure went up. She had an 8 pound weight gain and became quite short of  breath. She was on 60 mg of Lasix and ended up taking 80 mg in the morning  and 40 in the evening and diuresed well. She was on this regimen for about a  week before her weight stabilized and she is back down to 80 mg once a day.  Her breathing is greatly improved as well as her edema. She denies any  excessive salt intake or change in her diet during this time and so she is  very vigilant about this. Her main concern is she has to have spinal surgery  by Dr. Noel Gerold in January, but this has been pre-approved by Dr. Juanda Chance, as  his last note indicates.   CURRENT MEDICATIONS:  1. Micardis 80 mg daily.  2. Pravachol 40 mg daily.  3. Coumadin as directed.  4. Lasix 80 mg daily.   PHYSICAL EXAMINATION:  This is a very pleasant 77 year old white female in  no acute distress. Blood pressure 143/79. Pulse 71. Weight 176.  NECK: Without JVD, HJR, bruit or thyroid enlargement.  LUNGS:  Clear anterior, posterior and lateral.  HEART: Regular rate and rhythm at 70 beats per minute. Normal S1 and S2 with  1/6 systolic murmur at the left sternal border and apex.  ABDOMEN: Soft without organomegaly, masses, lesions or abnormal tenderness.  EXTREMITIES: She has brawny changes,  trace of ankle edema, decreased but  present distal pulses.  EKG with magnet: Ventricular paced. Without magnet: Looks like she is in  underlying atrial flutter.   IMPRESSION:  1. Sick sinus syndrome with chronic atrial fibrillation, status post AV      nodal ablation.  2. Status post recent generator change with implantation of a Victory VVI      St. Jude pacemaker in September 2006. Pacer dependent.  3. History of normal coronary angiography in 2006.  4. History of congestive heart failure related to diastolic dysfunction,      now compensated.  5. Severe lumbar disk spine disease, for surgery in January.   PLAN:  At this time, the patient has stabilized from her heart failure. Her  BMET is within normal limits. I will make no changes at this time. I have  told her if her weight starts to climb 2-3 pounds overnight, she notices  edema  and increased shortness of breath she can go ahead and increase her  Lasix as she did this past time.  In the meantime, she will see Dr. Juanda Chance  back in December and have her pacemaker checked prior to undergoing surgery.  Please refer to his note in August with recommendations for Carrie [name] to  be at surgery to ensure there is no interaction and to program her pacemaker  appropriately if needed as well as holding her Coumadin five days prior to  surgery.      Jacolyn Reedy, PA-C  Electronically Signed      Jonelle Sidle, MD  Electronically Signed   ML/MedQ  DD: 07/07/2006  DT: 07/07/2006  Job #: [id number]   cc:   Everardo Beals. Juanda Chance, MD, [facility]  Richard Pete Glatter, M.D.

## 2011-01-24 NOTE — Discharge Summary (Signed)
Carrie [name], Carrie [name] NO.:  [id number]   MEDICAL RECORD NO.:  [id number]          PATIENT TYPE:  INP   LOCATION:  5031                         FACILITY:  [facility]   PHYSICIAN:  Sharolyn Douglas, M.D.        DATE OF BIRTH:  08-04-1920   DATE OF ADMISSION:  11/10/2005  DATE OF DISCHARGE:  11/12/2005                                 DISCHARGE SUMMARY   DISCHARGE DIAGNOSIS:  Lumbar compression fractures secondary to  osteoporosis, L1 and L4.   PROCEDURE:  L1 and L4 kyphoplasty on November 11, 2005.   HOSPITAL COURSE:  The patient was admitted to the hospital from the office  secondary to severe intractable pain.  She had brought with her a bone scan  done in [place].  This suggested subacute compression fractures in the  lumbar spine.  She was admitted to the hospital.  CT scan revealed  compression deformities of L1 and L4.  The risks and benefits of kyphoplasty  were discussed with both her and her son, and she elected to proceed.  On  November 11, 2005, she was taken to the operating room.  There were no  complications.   Postoperatively, she noted significant improvement in her back pain.  She  was found to have a UTI and treated with IV Cipro.  She is instructed to  speak to her primary care physician to follow up on the urinary tract  infection.   CONDITION ON DISCHARGE:  Improved.   FOLLOW UP:  The patient will follow up with me in two weeks.  She will  contact her primary care physician for her urinary tract infection and  continue to treat with Dr. Juanda Chance for her heart.   DISCHARGE MEDICATIONS:  She is to resume her home medications, including  Coumadin which she had stopped at Dr. Regino Schultze request preoperatively.      Sharolyn Douglas, M.D.  Electronically Signed     MC/MEDQ  D:  11/12/2005  T:  11/13/2005  Job:  [id number]   cc:   Charlies Constable, M.D. [facility]  [address]  [address]  [place]  [place] [address]

## 2011-01-24 NOTE — H&P (Signed)
NAME:  Carrie [name], Carrie [name]                         ACCOUNT NO.:  [id number]   MEDICAL RECORD NO.:  [id number]                   PATIENT TYPE:  INP   LOCATION:  3711                                 FACILITY:  [facility]   PHYSICIAN:  Adolph Pollack, M.D.            DATE OF BIRTH:  1920-01-07   DATE OF ADMISSION:  02/02/2004  DATE OF DISCHARGE:                                HISTORY & PHYSICAL   CHIEF COMPLAINT:  Severe painful abdominal mass, nausea and dry heaves.   HISTORY OF PRESENT ILLNESS:  This 77 year old female was in her normal state  of health, till she woke up this morning and had the acutely painful bulge  in the supraumbilical region.  She subsequently presented to the [facility] and they feared that she had incarcerated, possibly  strangulated hernia.  She was sent to the emergency department, where she  was in severe pain.  She had had some nausea and dry heaves, no fever or  chills.  She had no history of a previous umbilical hernia.   PAST MEDICAL HISTORY:  1. Diverticulitis.  2. Congestive heart failure.  3. Atrial fibrillation.  4. Hypertension.   PREVIOUS OPERATIONS:  1. Hysterectomy.  2. Appendectomy.  3. Pacemaker insertion.  4. Radioablation by Dr. Duke Salvia.   MEDICATIONS:  1. Lasix 40 mg a day.  2. Micardis 80 mg a day.  3. Coumadin.   ALLERGIES:  None known.   SOCIAL HISTORY:  No tobacco or alcohol use.  She is here with her son and  daughter-in-law.   REVIEW OF SYSTEMS:  ENT:  She had a nose bleed a few days ago and has  stopped her Coumadin.  Wednesday, her INR was 2.1.  CARDIOVASCULAR:  No  known heart attack.  PULMONARY:  She states she had pneumonia in the past.  No asthma, COPD, TB.  GI:  No known peptic ulcer disease or hepatitis.  GU:  No kidney stones.  ENDOCRINE:  No diabetes, thyroid disease or  hypercholesterolemia.  NEUROLOGIC:  No strokes or seizures.  HEMATOLOGIC:  No known bleeding source or blood  clots.   PHYSICAL EXAM:  GENERAL:  A very uncomfortable female, crying in pain.  VITAL SIGNS:  Temperature is 98.2, blood pressure is 156/78, pulse 71.  EYES:  Extraocular motions are intact.  No icterus.  NECK:  Neck supple without palpable masses.  CHEST:  There is a foreign body in the scar in the left upper chest wall.  RESPIRATORY:  Breath sounds are equal and clear.  Respirations unlabored.  CARDIOVASCULAR:  Cardiovascular demonstrates a regular rate and rhythm.  ABDOMEN:  Her abdomen is soft with mild diffuse tenderness.  There is a  tender supraumbilical nodule that with gentle pressure over about a minute's  time, I am able to reduce.  Hypoactive bowel sounds are noted.  She appears  mildly distended.  EXTREMITIES:  Some chronic  venous stasis but no edema noted.   LABORATORY DATA:  White cell count 9600 but there is a leftward shift.  Hemoglobin 14.  INR 1.7.  Glucose 128, rest of electrolytes normal.   X-RAYS:  There is no free air seen.  There are some mildly dilated small  bowel loops and a few air-filled levels suggesting a possible partial  obstruction.   IMPRESSION:  1. Incarcerated and questionably strangulated umbilical hernia -- this has     been reduced, but after going back and seeing her now an hour to an hour     and a half later, she still has significant pain in the region.  2. Chronic anticoagulation.  3. Atrial fibrillation.   PLAN:  I discussed the options with them.  I felt that given the fact that  this had been out over 12 hours and was suggestive of potential bowel  obstruction, she could have a loop of small bowel up into the hernia or part  of it, and she could have an area of ischemic bowel.  I suggested proceeding  with a diagnostic laparoscopy, possible bowel resection and/or hernia  repair.  We did discuss the procedure, the rationale and risks.  The risks  included, but not limited to, bleeding, infection, wound-healing problems,   cardiopulmonary complications, anesthesia, going back into atrial  fibrillation, and accidental damage to intra-abdominal organs.  The other  alternative would be to just observe her.  However, given the amount of  pain, they are agreeable to proceeding with the operation.                                                Adolph Pollack, M.D.    Kari Baars  D:  02/02/2004  T:  02/04/2004  Job:  [id number]   cc:   Charlies Constable, M.D. [facility]

## 2011-01-24 NOTE — Op Note (Signed)
NAME:  Carrie [name], Carrie [name] NO.:  [id number]   MEDICAL RECORD NO.:  [id number]          PATIENT TYPE:  AMB   LOCATION:  SDS                          FACILITY:  [facility]   PHYSICIAN:  Sharolyn Douglas, M.D.        DATE OF BIRTH:  [birth year]   DATE OF PROCEDURE:  09/25/2006  DATE OF DISCHARGE:                               OPERATIVE REPORT   DIAGNOSES:  1. Lumbar degenerative disk disease, osteoporosis with chronic back      and leg pain.  2. Neuropathy.   PROCEDURE:  1. Placement of internal pulse generator for permanent spinal cord      stimulator left hip.  2. Programming of spinal cord stimulator.   Please note that this is stage 2 of a two-part staged procedure.   SURGEON:  Sharolyn Douglas, M.D.   ASSISTANT:  Verlin Fester, P.A.   ANESTHESIA:  General tracheal.   ESTIMATED BLOOD LOSS:  Minimal.   COMPLICATIONS:  None.  Needle and sponge count correct.   INDICATIONS:  The patient is a pleasant 77 year old female who has had a  permanent trial spinal cord stimulator lead placed 1 week ago.  She had  excellent improvement in her back and chronic lower extremity pain.  She  would like to proceed with placement of the IPG today.  Risks, benefits,  and alternatives were reviewed.   PROCEDURE:  After informed consent, she was taken to the operating room.  She underwent general endotracheal anesthesia without difficulty and  given prophylactic IV antibiotics.  Carefully turned prone onto the  Wilson frame.  All bony prominences padded.  Face and eyes protected at  all times.  The back was prepped and draped in usual sterile fashion.  The sutures from the midline thoracic incision had been previously  removed.  The thoracic incision was opened, and the extension leads were  disconnected and retrieved proximally.  A second incision was then made  over the left hip.  Dissection was carried sharply through the  subcutaneous tissue.  A subcutaneous pocket was created.  A  tunneling  tool was then used to connect the 2 incisions.  The stimulator lead  wires were then passed through the tunneling tool into the IPG incision.  The Eon IPG was then attached to the stimulator wires.  The IPG was  placed into its pocket and tested.  There was good impedance.  The  incisions were then closed in layers using 2-0 Vicryl suture, 3-0 and 4-  0 Vicryl suture in the subcutaneous layer, followed by Dermabond on the  skin edges.  The patient was turned supine, extubated without  difficulty, transferred to recovery in stable condition.  In the  recovery room, she was comfortable.  We worked with her in programming  her stimulator along with the ANS representative.   It should be noted that my assistant, Verlin Fester, P.A., was present  throughout the procedure including the positioning, the exposure, and  placement of the IPG, and she also assisted with wound closure.      Sharolyn Douglas, M.D.  Electronically Signed  MC/MEDQ  D:  09/25/2006  T:  09/26/2006  Job:  [id number]

## 2011-01-24 NOTE — Cardiovascular Report (Signed)
NAME:  Carrie, [name] NO.:  [id number]   MEDICAL RECORD NO.:  [id number]          PATIENT TYPE:  OIB   LOCATION:  2899                         FACILITY:  [facility]   PHYSICIAN:  Charlies Constable, M.D. [facility] DATE OF BIRTH:  July 04, 1920   DATE OF PROCEDURE:  04/15/2005  DATE OF DISCHARGE:                              CARDIAC CATHETERIZATION   PROCEDURE:  Explantation of the old Synchrony II DVD generator for end of  life (Model Number 2022L, Serial Number [id number], implanted of implant April 10, 1994; inspection of the old ventricular lead (Model Number [id number], Serial  Number [id number], implanted April 10, 1994), capping of the old atrial lead  and implantation of a new Victory VVIR pacemaker (Model Number [id number], Serial  Number [id number]).   ANESTHESIA:  1% local Xylocaine.   ESTIMATED BLOOD LOSS:  Less than 10 mL.   COMPLICATIONS:  None.   DESCRIPTION OF PROCEDURE:  The procedure was performed in Laboratory Room  #3.  The procedure was performed after we did a diagnostic catheterization  which showed no evidence of significant obstructive disease.  We first  passed a temporary pacemaker via the right femoral vein to the right  ventricle because the patient was pacer dependent.   The left anterior chest was prepped and draped in the usual fashion.  The  skin and subcutaneous tissue was anesthetized using 1% local Xylocaine.  This was over the old pacer pocket.  Incision was made just below the old  incision and extended to the pocket.  The pocket was opened, and the  generator was removed.  The ventricular lead was interrogated with good  pacing parameters described below.  The atrial lead was capped and sewed to  the posterior aspect of the pacer pocket with 1 suture of 1-0 silk.  The  pocket was irrigated with sterile kanamycin solution.  The old ventricular  lead was attached to the new Victory generator and implanted into the pocket  and secure loosely to the prepectoral  fascia with 1-0 silk.  The  subcutaneous tissue was closed with running 2-0 Dexon.  The skin was closed  with running 5-0 Dexon.   PACING PARAMETERS:  Ventricular unipolar: Threshold 0.9 volts at a pulse  width of 0.5 msec, resistance 718 ohms.  There was no R wave since the  patient was pacer dependent.   Ventricular bipolar: Threshold 1.1 volts at a pulse width of 0.5 msec,  resistance 1002 ohms.  There was no R wave because patient was pacer  dependent.   The patient tolerated the procedure well and left the laboratory in  satisfactory condition.       BB/MEDQ  D:  04/15/2005  T:  04/16/2005  Job:  [id number]   cc:   Julieanne Manson, M.D.

## 2011-01-24 NOTE — H&P (Signed)
NAME:  Carrie [name], Carrie [name]                         ACCOUNT NO.:  [id number]   MEDICAL RECORD NO.:  [id number]                   PATIENT TYPE:  INP   LOCATION:  1825                                 FACILITY:  [facility]   PHYSICIAN:  Doylene Canning. Ladona Ridgel, M.D.               DATE OF BIRTH:  March 11, 1920   DATE OF ADMISSION:  09/29/2003  DATE OF DISCHARGE:                                HISTORY & PHYSICAL   ADMITTING DIAGNOSIS:  Dyspnea with chest pain in a patient with fevers and  chronic atrial fibrillation.   CHIEF COMPLAINT:  Chest pain and shortness of breath.   HISTORY OF PRESENT ILLNESS:  The patient is a very pleasant 77 year old  woman with a history of hypertension for many years, chronic atrial  fibrillation status post permanent pacemaker insertion in 1995, status post  what appears to be AV nodal ablation in 1996.  She has a history of anemia  and GI bleeding in the past as well as hematuria and COPD.  She also has a  history of pneumonia and bronchitis which was suffered when she was visiting  her daughter down in [place] in 2003.  The patient has recently been under  some increasing stress secondary to her husband undergoing bypass surgery  and aortic valve replacement several days ago.  Over the last 2 days she has  had increasing fatigue, dyspnea, and cough.  Today she developed substernal  chest discomfort and increasing shortness of breath.  She also complains of  feeling chilled and cold and weak.  Her cough is for the most part  unproductive.  There is no radiation of the pain to her arm or neck or jaw.  She denies hemoptysis.  She had not checked her temperature until coming to  the emergency room but has felt chills.  She denies syncope or near syncope.  She denies peripheral edema.   PAST MEDICAL HISTORY:  Is as previously noted.  In addition, there is a  history of an enlarged heart by echo in September 2004, the results of  which are unavailable.  There is a history of  abdominal bleeding.  There is  also a history of diverticular disease and polyps.   SOCIAL HISTORY:  The patient lives in [place] with her husband.  She  stopped smoking 40 years ago and denies alcohol abuse except for 1 alcoholic  beverage daily.   FAMILY HISTORY:  Notable for a mother who died at age 77 of heart failure  and father dying at the age of 2 of a stroke.   MEDICATIONS:  Coumadin, Micardis, Lasix, hydrochlorothiazide, and recent  antibiotics.   REVIEW OF SYSTEMS:  Notable for fevers and chills.  She denies night sweats  or adenopathy or weight changes.  She denies any vision or hearing problems.  She does have a very mild headache.  She denies skin rashes or lesions.  Her  chest pain  and shortness of breath and dyspnea are as previously noted.  She  denies claudication but does have occasional cough but without wheezes.  She  denies dysuria.  She denies urinary frequency or urgency.  She does have  occasional dysuria.  She denies weakness or numbness, depression or anxiety  except for the anxiousness that she feels with her husband's present  illness.  She denies arthralgias or arthritis.  She denies nausea, vomiting,  diarrhea, or constipation.  The rest of her review of systems is negative.   PHYSICAL EXAM:  GENERAL:  Pleasant, elderly-appearing woman in no distress.  VITAL SIGNS:  Blood pressure 113/63, pulse 71 and regular, respirations 20,  temperature 101.2.  HEENT:  Normocephalic, atraumatic.  Pupils equal and round.  Oropharynx  moist.  Sclerae anicteric.  There was no obvious adenopathy.  LUNGS:  Clear bilaterally to auscultation.  I did not appreciate wheezes,  rales, or rhonchi.  CARDIOVASCULAR:  Regular rate and rhythm with normal S1 and S2.  ABDOMEN:  Obese, nontender, nondistended.  There is no organomegaly.  EXTREMITIES:  No cyanosis, clubbing, or edema.  Pulses are 2+ and symmetric.  NEUROLOGIC:  Nonfocal.   Chest x-ray demonstrates mild  cardiomegaly.   EKG demonstrates ventricular pacing at 75 beats per minute.   The INR is 3.4.  Potassium 2.8.  White count 11.6, with a hemoglobin of 14.   IMPRESSION:  1. New onset of chest heaviness and worsening dyspnea.  2. History of urinary tract infection.  3. Fevers with subjective chills.  4. Hypokalemia.  5. Chronic atrial fibrillation.  6. Status post AV nodal ablation and pacemaker.  7. Chronic Coumadin therapy.  8. Chronic obstructive pulmonary disease.   DISCUSSION:  I plan to admit the patient to the hospital and observe her on  telemetry.  Will obtain serial cardiac enzymes and EKGs, blood and urine  cultures.  Will replete her potassium.  Will follow her BNP and give her  Lasix as needed.  Additional testing and procedures pending the results of  all of the above.                                                Doylene Canning. Ladona Ridgel, M.D.    GWT/MEDQ  D:  09/29/2003  T:  09/30/2003  Job:  [id number]   cc:   Julieanne Manson  [address]  [place]  [place] [address]  Fax: [phone]   Charlies Constable, M.D.

## 2011-01-24 NOTE — Op Note (Signed)
[name], [name] NO.:  [id number]   MEDICAL RECORD NO.:  [id number]          PATIENT TYPE:  INP   LOCATION:  5031                         FACILITY:  [facility]   PHYSICIAN:  Sharolyn Douglas, M.D.        DATE OF BIRTH:  22-Apr-1920   DATE OF PROCEDURE:  11/11/2005  DATE OF DISCHARGE:  11/12/2005                                 OPERATIVE REPORT   DIAGNOSIS:  Osteoporotic compression fractures L1 and L4 with intractable  pain.   PROCEDURES:  1.  L1 and L4 kyphoplasty.  2.  Radiographic interpretation of fluoroscopic images used for kyphoplasty      at L1 and L4.   SURGEON:  Sharolyn Douglas, MD   ASSISTANT:  None.   ANESTHESIA:  General endotracheal.   COMPLICATIONS:  None.   ESTIMATED BLOOD LOSS:  Minimal   INDICATIONS:  The patient is a pleasant 77 year old female who is status  post previous kyphoplasty at L2. She redeveloped severe intractable pain.  She saw a rheumatologist in [place] had a bone scan done which showed  increased uptake in the lumbar spine consistent with new subacute  compression fractures. Because of her severe pain. She was admitted to the  hospital. A CT scan was obtained because she has a pacemaker and cannot have  an MRI scan. This revealed new compression deformities at L1 and L4. Because  of her extreme discomfort, she has elected to undergo kyphoplasty in hopes  of improving her symptoms. Risks and benefits were discussed with both her  and her son.   DESCRIPTION OF PROCEDURE:  She was identified in the holding area and taken  to the operating room and underwent general endotracheal anesthesia without  difficulty, given prophylactic IV antibiotics. She was turned prone onto the  Argyle frame. Bolsters placed under her hips and chest. The back was  prepped and draped in the usual sterile fashion. Biplanar fluoroscopy was  brought into the field and true AP and lateral images of the L1 and L4  vertebral bodies were obtained.   A  small stab incision was made to the left of the L1 pedicle and to the  right of the L4 pedicle. A Jamshidi needle used to cannulate the pedicles  under live fluoroscopy. A working cannula was then established into the  vertebral body. A deep bone biopsy taken at each level and sent to  pathology. Intervertebral bone tamp was then placed through the working  cannulas into the vertebral bodies. The tamps were expanded bringing about a  partial reduction of vertebral endplates. We then removed the balloon tamps  and pushed partially hardened methyl methacrylate cement into the vertebral  body under live biplanar fluoroscopy. We had a good cement fill. The working  cannulas  were removed. A nylon suture was used to approximate the skin edges. Sterile  dressing applied. Needle and sponge count correct. The patient was  transferred to recovery stable condition. In the recovery room, she had a  normal neurologic exam.      Sharolyn Douglas, M.D.  Electronically Signed     MC/MEDQ  D:  11/12/2005  T:  11/12/2005  Job:  [id number]

## 2011-01-24 NOTE — Consult Note (Signed)
NAME:  Carrie [name], LEGNER                         ACCOUNT NO.:  [id number]   MEDICAL RECORD NO.:  [id number]                   PATIENT TYPE:  INP   LOCATION:  3711                                 FACILITY:  [facility]   PHYSICIAN:  Jefry H. Pollyann Kennedy, M.D.                DATE OF BIRTH:  [birth year]   DATE OF CONSULTATION:  02/08/2004  DATE OF DISCHARGE:                                   CONSULTATION   REASON FOR CONSULTATION:  History of epistaxis.   HISTORY OF PRESENT ILLNESS:  This is an 77 year old lady who was admitted to  the hospital with incarcerated ventral hernia.  She has a history of atrial  fibrillation.  She has a history of nosebleeds.  She had one about two weeks  ago and another one about six months ago.  About six months ago when she had  that one she saw an ENT physician in [place].  When she had the recent  one, she bled for about eight hours and did not seek medical attention and  eventually stopped.  She has had no further bleeding since then.  She  believes that the bleeding was mainly from the right side but it was  anterior and posterior and hard to tell which side it was more from.   PAST MEDICAL AND SURGICAL HISTORY:  As listed on the admission history and  physical.  She has been on Coumadin for about 10 years and is scheduled to  get back on it for atrial fibrillation.   PHYSICAL EXAMINATION:  GENERAL:  She is a healthy-appearing elderly lady in  no distress.  NECK:  No palpable neck masses.  HEENT:  Nasal cavities are clear.  There is slight rightward septal  deviation posteriorly with a small lesion in the anterior septum  approximately 2 cm behind the piriform aperture that appears to be healing.  There is no active bleeding or dried blood.  There is no crusting  ulceration.  Oropharynx and pharynx are free of any mucosal abnormalities.  There is no drainage down the posterior pharynx.   IMPRESSION:  Recent history of epistaxis, no active bleeding or active  nasal  disease.   RECOMMENDATIONS:  Recommend frequent use of nasal saline spray to keep the  nasal cavities moisturized.  Recommend follow up with me or contact me  immediately while in the hospital if she has any further bleeding.                                               Jefry H. Pollyann Kennedy, M.D.    JHR/MEDQ  D:  02/08/2004  T:  02/08/2004  Job:  [id number]

## 2011-02-04 ENCOUNTER — Encounter: Payer: Self-pay | Admitting: Cardiovascular Disease

## 2011-02-04 ENCOUNTER — Ambulatory Visit (INDEPENDENT_AMBULATORY_CARE_PROVIDER_SITE_OTHER): Payer: Medicare Other | Admitting: Cardiovascular Disease

## 2011-02-04 DIAGNOSIS — R0602 Shortness of breath: Secondary | ICD-10-CM | POA: Insufficient documentation

## 2011-02-04 DIAGNOSIS — R609 Edema, unspecified: Secondary | ICD-10-CM

## 2011-02-04 DIAGNOSIS — I509 Heart failure, unspecified: Secondary | ICD-10-CM

## 2011-02-04 DIAGNOSIS — I5032 Chronic diastolic (congestive) heart failure: Secondary | ICD-10-CM

## 2011-02-04 DIAGNOSIS — I4891 Unspecified atrial fibrillation: Secondary | ICD-10-CM

## 2011-02-04 MED ORDER — BENAZEPRIL HCL 20 MG PO TABS: 20.0000 mg | ORAL_TABLET | Freq: Every day | ORAL | Status: DC

## 2011-02-04 MED ORDER — ALPRAZOLAM 0.25 MG PO TABS: ORAL_TABLET | ORAL | Status: DC

## 2011-02-04 NOTE — Assessment & Plan Note (Signed)
She does have a component of venous insufficiency which would explain her edema. As we confirmed with echocardiography and estimation of her right ventricular systolic pressures

## 2011-02-04 NOTE — Patient Instructions (Addendum)
Please decrease your benazepril in 1/2 to 20 mg a day.  Please call us if you have new issues that need to be addressed before your next appt.  TO BE SCHEDULED FIRST AVAILABLE:Your physician has requested that you have an echocardiogram. Echocardiography is a painless test that uses sound waves to create images of your heart. It provides your doctor with information about the size and shape of your heart and how well your heart's chambers and valves are working. This procedure takes approximately one hour. There are no restrictions for this procedure. Your physician recommends that you schedule a follow-up appointment in: 1 WEEK AFTER Echo  We will send you a letter with your lab results that were drawn today.

## 2011-02-04 NOTE — Assessment & Plan Note (Signed)
Blood pressure is low on her last visit and today. We will decrease her benazepril to 20 mg daily.

## 2011-02-04 NOTE — Assessment & Plan Note (Signed)
It is uncertain if her shortness of breath and edema secondary to heart failure. We have ordered an echocardiogram to estimate right ventricular systolic pressure. 2 years ago, this was mildly elevated at 45 mm mercury. The increased Lasix has not helped her symptoms. We will check a basic metabolic panel and BNP today.

## 2011-02-04 NOTE — Progress Notes (Signed)
   Patient ID: Carrie [name], female    DOB: [birth year], 77 y.o.   MRN: [id number]  HPI Comments:  76 year old  female patient with a h/o diastolic heart failure,  pneumonia 01/2010,mild pulmonary hypertension,  long history of back problems requiring surgery,  chronic atrial fibrillation with AV node ablation, placement of pacemaker, who presents for routine followup.  On her last visit several weeks ago, she had significant shortness of breath, edema, anxiety. We increased her Lasix And started very low dose Xanax for sleeping. She had a fall earlier in the year and reports that since that time and her long rehabilitation, she has not felt the same. On the higher dose Lasix, she continues to have edema, shortness of breath. She has difficulty sleeping on one pillow. She has shortness of breath with exertion which is chronic though is getting worse.  She has been wearing TED hose recently. She continues to have night sweats she sleeps in a recliner.   She uses a walker at [employer], a walker when she is doing errands. She had pain in her feet recently was started on Neurontin which helped her foot discomfort.   EKG shows ventricularly paced rhythm with rate 70 beats per minute, Rare PVC        Review of Systems  Constitutional: Positive for fatigue.  HENT: Negative.   Eyes: Negative.   Respiratory: Positive for shortness of breath.   Cardiovascular: Positive for leg swelling.  Gastrointestinal: Negative.   Musculoskeletal: Positive for back pain, joint swelling, arthralgias and gait problem.  Skin: Negative.   Neurological: Negative.   Hematological: Negative.   Psychiatric/Behavioral: Negative.   All other systems reviewed and are negative.    BP 98/70  Pulse 69  Ht 5\' 4"  (1.626 m)  Wt 180 lb (81.647 kg)  BMI 30.90 kg/m2  SpO2 97%   Physical Exam  Nursing note and vitals reviewed. Constitutional: She is oriented to person, place, and time. She appears well-developed and  well-nourished.  HENT:  Head: Normocephalic.  Nose: Nose normal.  Mouth/Throat: Oropharynx is clear and moist.  Eyes: Conjunctivae are normal. Pupils are equal, round, and reactive to light.  Neck: Normal range of motion. Neck supple. No JVD present.  Cardiovascular: Normal rate, regular rhythm, S1 normal, S2 normal, normal heart sounds and intact distal pulses.  Exam reveals no gallop and no friction rub.   No murmur heard.      Mild pitting edema to below the knees, TED hose in place.  Pulmonary/Chest: Effort normal and breath sounds normal. No respiratory distress. She has no wheezes. She has no rales. She exhibits no tenderness.  Abdominal: Soft. Bowel sounds are normal. She exhibits no distension. There is no tenderness.  Musculoskeletal: Normal range of motion. She exhibits no edema and no tenderness.  Lymphadenopathy:    She has no cervical adenopathy.  Neurological: She is alert and oriented to person, place, and time. Coordination normal.  Skin: Skin is warm and dry. No rash noted. No erythema.  Psychiatric: She has a normal mood and affect. Her behavior is normal. Judgment and thought content normal.         Assessment and Plan

## 2011-02-04 NOTE — Assessment & Plan Note (Signed)
She states having no relief on very low-dose Xanax at nighttime. We have suggested she increase the dose to 0.25 mg daily and followup with Dr. Alphonsus Sias.

## 2011-02-04 NOTE — Assessment & Plan Note (Signed)
We will continue the Lasix and check her labs today. She denies any previous significant smoking. No obvious COPD or underlying lung disease. Most of her symptoms seem to occur at nighttime with sleeping. She does seem to have a significant anxiety component

## 2011-02-05 LAB — BASIC METABOLIC PANEL
CO2: 36 mEq/L — ABNORMAL HIGH (ref 19–32)
Calcium: 9.2 mg/dL (ref 8.4–10.5)
Creat: 1 mg/dL (ref 0.40–1.20)
Glucose, Bld: 116 mg/dL — ABNORMAL HIGH (ref 70–99)

## 2011-02-07 ENCOUNTER — Ambulatory Visit: Payer: Medicare Other | Admitting: Internal Medicine

## 2011-02-12 ENCOUNTER — Other Ambulatory Visit: Payer: Self-pay | Admitting: Internal Medicine

## 2011-02-12 ENCOUNTER — Encounter: Payer: Self-pay | Admitting: Internal Medicine

## 2011-02-12 DIAGNOSIS — I495 Sick sinus syndrome: Secondary | ICD-10-CM

## 2011-02-13 ENCOUNTER — Ambulatory Visit (INDEPENDENT_AMBULATORY_CARE_PROVIDER_SITE_OTHER): Payer: Medicare Other | Admitting: Internal Medicine

## 2011-02-13 ENCOUNTER — Encounter: Payer: Self-pay | Admitting: Internal Medicine

## 2011-02-13 VITALS — BP 118/70 | HR 80 | Temp 98.6°F | Ht 62.0 in | Wt 180.0 lb

## 2011-02-13 DIAGNOSIS — F411 Generalized anxiety disorder: Secondary | ICD-10-CM

## 2011-02-13 DIAGNOSIS — F419 Anxiety disorder, unspecified: Secondary | ICD-10-CM

## 2011-02-13 DIAGNOSIS — G589 Mononeuropathy, unspecified: Secondary | ICD-10-CM

## 2011-02-13 DIAGNOSIS — I509 Heart failure, unspecified: Secondary | ICD-10-CM

## 2011-02-13 DIAGNOSIS — M109 Gout, unspecified: Secondary | ICD-10-CM

## 2011-02-13 DIAGNOSIS — I5032 Chronic diastolic (congestive) heart failure: Secondary | ICD-10-CM

## 2011-02-13 NOTE — Assessment & Plan Note (Signed)
Seems quiet now on allopurinol Will continue

## 2011-02-13 NOTE — Assessment & Plan Note (Signed)
Now with PND Very anxious about breathing problems Will increase the furosemide to 80 bid. Consider brief course of zaroxlyn Awaiting echo

## 2011-02-13 NOTE — Patient Instructions (Signed)
Please increase the furosemide to 80 mg twice a day Please continue the gabapentin for the nerve pain but if you awaken with pain, try the tramadol

## 2011-02-13 NOTE — Assessment & Plan Note (Signed)
Due to dyspnea and change in status Will try to increase diuresis to improve resp status

## 2011-02-13 NOTE — Assessment & Plan Note (Signed)
Some pain still at night Probably neuropathic Not consistent so will leave gabapentin dose the same Try tramadol prn

## 2011-02-13 NOTE — Progress Notes (Signed)
Subjective:    Patient ID: Carrie [name], female    DOB: [birth year], 77 y.o.   MRN: [id number]  HPI Doing fair Has been having some trouble breathing Dr Mariah Milling is checking echo  Still having feet welling Has "terrible pains in my feet and bone on the side" Did try colchicine once with bad pain---didn't help Had to use pain pills  Swelling is some better with compression hose No chest pain occ awakens with dyspnea--has to get up to chair Taking lasix 80/40 now Lab Results  Component Value Date   CREATININE 1.00 02/04/2011   Current Outpatient Prescriptions on File Prior to Visit  Medication Sig Dispense Refill  . allopurinol (ZYLOPRIM) 100 MG tablet Take 1-2 tablets (100-200 mg total) by mouth daily.  60 tablet  5  . ALPRAZolam (XANAX) 0.25 MG tablet Take 1/2 - 1 tablet by mouth at bedtime as needed for shortness of breath   30 tablet  0  . benazepril (LOTENSIN) 20 MG tablet Take 1 tablet (20 mg total) by mouth daily.  30 tablet  6  . cholecalciferol (VITAMIN D) 1000 UNITS tablet Take 1,000 Units by mouth daily.        . colchicine 0.6 MG tablet 1 tab by mouth two times a day as needed for gout pain       . furosemide (LASIX) 80 MG tablet Take 1 tablet (80 mg total) by mouth as directed. Take 1 tablet in AM and 1/2 tablet in PM.   45 tablet  6  . gabapentin (NEURONTIN) 300 MG capsule Take 3 capsules (900 mg total) by mouth at bedtime. And take 1 in the morning. Add 1 at lunch time if needed  90 capsule  3  . Lactulose SOLN 15-30 cc's twice a day as needed for constipation.  1 Bottle  3  . metoprolol (LOPRESSOR) 25 MG tablet Take 25 mg by mouth daily.        . nitroGLYCERIN (NITROSTAT) 0.4 MG SL tablet Place 0.4 mg under the tongue. Place 1 tablet under tongue as directed         . traMADol (ULTRAM) 50 MG tablet TAKE 1/2 TO 1 TABLET THREE TIMES A DAY  IF NEEDED FOR PAIN  30 tablet  1  . traZODone (DESYREL) 50 MG tablet take 2 tablets at bedtime to help sleep      . vitamin B-12  (CYANOCOBALAMIN) 100 MCG tablet Take 100 mcg by mouth daily.        Marland Kitchen warfarin (COUMADIN) 4 MG tablet Take 1 tablet (4 mg total) by mouth as directed.  30 tablet  3  . DISCONTD: gabapentin (NEURONTIN) 300 MG capsule Take 3 capsules (900 mg total) by mouth at bedtime.  90 capsule  5   Past Medical History  Diagnosis Date  . Atrial fibrillation   . Diastolic heart failure     mild volume overload and increased symptoms of shortness of breath  . DJD (degenerative joint disease) of lumbar spine   . Degenerative joint disease   . HTN (hypertension)   . History of recurrent UTIs     Dr. Artis Flock   . Sleep disturbance   . Neuropathy   . Gout     Past Surgical History  Procedure Date  . Ventricular ablation surgery     atrioventricular nodal ablation  . Ventricular cardiac pacemaker insertion     [employer] Victory  Pacemaker programmed to ventricular demand pacing  . Knee surgery  right  . Appendectomy   . Breast biopsy   . Pulse generator implant     placement of internal pulse generator for permanent spinal cord- stimulartor left hip    Family History  Problem Relation Age of Onset  . Heart disease Mother     heart failure  . Stroke Father     History   Social History  . Marital Status: Married    Spouse Name: N/A    Number of Children: 2  . Years of Education: N/A   Occupational History  . homemaker    Social History Main Topics  . Smoking status: Former Smoker -- 1.0 packs/day    Types: Cigarettes  . Smokeless tobacco: Not on file   Comment: quit long ago   . Alcohol Use: 0.5 oz/week    1 drink(s) per week  . Drug Use: No  . Sexually Active: Not on file   Other Topics Concern  . Not on file   Social History Narrative   Son Rosanne Ashing holds health care power of attorney. Does request DNR. No tube feeds in cognitively unaware      Review of Systems Eating okay Mood is tested by feeling bad---has been down at times. Family is very supportive though not  nearby    Objective:   Physical Exam  Constitutional: She appears well-developed and well-nourished.  Neck: Normal range of motion. Neck supple.  Cardiovascular: Normal rate and normal heart sounds.  Exam reveals no gallop.   No murmur heard.      Irregular rhythm  Musculoskeletal:       1-2+ edema No joint tenderness of gout apparent  Psychiatric:       Anxious and tearful when talking about feeling bad Dr Mariah Milling gave alprazolam for nighttime          Assessment & Plan:

## 2011-02-19 ENCOUNTER — Encounter: Payer: Medicare Other | Admitting: Emergency Medicine

## 2011-02-20 ENCOUNTER — Encounter: Payer: Self-pay | Admitting: Cardiovascular Disease

## 2011-02-20 ENCOUNTER — Other Ambulatory Visit: Payer: Self-pay | Admitting: Cardiovascular Disease

## 2011-02-20 DIAGNOSIS — R609 Edema, unspecified: Secondary | ICD-10-CM

## 2011-02-20 DIAGNOSIS — R0602 Shortness of breath: Secondary | ICD-10-CM

## 2011-02-25 ENCOUNTER — Other Ambulatory Visit (INDEPENDENT_AMBULATORY_CARE_PROVIDER_SITE_OTHER): Payer: Medicare Other | Admitting: *Deleted

## 2011-02-25 DIAGNOSIS — R609 Edema, unspecified: Secondary | ICD-10-CM

## 2011-02-25 DIAGNOSIS — R0602 Shortness of breath: Secondary | ICD-10-CM

## 2011-02-25 DIAGNOSIS — I4891 Unspecified atrial fibrillation: Secondary | ICD-10-CM

## 2011-03-05 ENCOUNTER — Encounter: Payer: Self-pay | Admitting: Cardiovascular Disease

## 2011-03-05 ENCOUNTER — Ambulatory Visit (INDEPENDENT_AMBULATORY_CARE_PROVIDER_SITE_OTHER): Payer: Medicare Other | Admitting: Cardiovascular Disease

## 2011-03-05 DIAGNOSIS — R0602 Shortness of breath: Secondary | ICD-10-CM

## 2011-03-05 DIAGNOSIS — I5032 Chronic diastolic (congestive) heart failure: Secondary | ICD-10-CM

## 2011-03-05 DIAGNOSIS — I10 Essential (primary) hypertension: Secondary | ICD-10-CM

## 2011-03-05 DIAGNOSIS — Z950 Presence of cardiac pacemaker: Secondary | ICD-10-CM

## 2011-03-05 DIAGNOSIS — Z7901 Long term (current) use of anticoagulants: Secondary | ICD-10-CM

## 2011-03-05 DIAGNOSIS — G589 Mononeuropathy, unspecified: Secondary | ICD-10-CM

## 2011-03-05 DIAGNOSIS — I272 Pulmonary hypertension, unspecified: Secondary | ICD-10-CM

## 2011-03-05 DIAGNOSIS — G479 Sleep disorder, unspecified: Secondary | ICD-10-CM

## 2011-03-05 DIAGNOSIS — I509 Heart failure, unspecified: Secondary | ICD-10-CM

## 2011-03-05 DIAGNOSIS — I4891 Unspecified atrial fibrillation: Secondary | ICD-10-CM

## 2011-03-05 DIAGNOSIS — I872 Venous insufficiency (chronic) (peripheral): Secondary | ICD-10-CM

## 2011-03-05 MED ORDER — METOLAZONE 2.5 MG PO TABS: 2.5000 mg | ORAL_TABLET | ORAL | Status: DC

## 2011-03-05 NOTE — Assessment & Plan Note (Signed)
Recent echocardiogram shows normal systolic function with significant pulmonary hypertension, left ventricular systolic pressure estimated at the 70 mmHg. She has diastolic dysfunction, atrial fibrillation, moderate MR and TR.  We will add metolazone 2.5 mg in the morning prior to Lasix 3 times per week. We have suggested to her that we have Dr. Alphonsus Sias Check a basic metabolic panel when he sees her in 2 weeks' time. My goal would be to run her creatinine borderline elevated at 1.3-1.4 in an effort to improve her breathing and her edema.

## 2011-03-05 NOTE — Assessment & Plan Note (Signed)
She continues to have problems with her sleeping. Uncertain if this is from shortness of breath and pulmonary hypertension or nasal congestion or restless legs or neuropathy. She is interested in changing gabapentin. I suggested she talk with Dr. Alphonsus Sias. Uncertain if she is a candidate for clonazepam or a medication for restless legs such as requip. Will defer to Dr. Alphonsus Sias.

## 2011-03-05 NOTE — Assessment & Plan Note (Signed)
Blood pressure is well controlled on today's visit. No changes made to the medications. 

## 2011-03-05 NOTE — Progress Notes (Signed)
   Patient ID: Carrie [name], female    DOB: 1919/09/14, 77 y.o.   MRN: [id number]  HPI Comments: This is an 77 year old white female patient with a h/o diastolic heart failure,  pneumonia 01/2010, severe pulmonary hypertension on recent echo,  long history of back problems requiring surgery,  chronic atrial fibrillation with AV node ablation, placement of pacemaker, Who presents for followup after her recent echocardiogram.  On her last visit, we decreased her benazepril secondary to hypotension. She reports that her blood pressure has improved. She does not feel well still low. She has worsening lower extremity edema, particularly in her thighs bilaterally. She wears TED hose. She also has significant shortness of breath with exertion. She complains of a tremor and jerking and wonders if it could be her gabapentin.   She continues to have periodic severe foot pain at nighttime. She also has some nasal congestion at night time with difficulty breathing and has to sit in a recliner to sleep   Old EKG shows ventricularly paced rhythm with rate 70 beats per minute.      Review of Systems  Constitutional: Positive for fatigue.  HENT: Negative.   Eyes: Negative.   Respiratory: Positive for shortness of breath.   Cardiovascular: Positive for leg swelling.  Gastrointestinal: Negative.   Musculoskeletal: Positive for gait problem.       Leg pain at nighttime described as a burning in her feet  Skin: Negative.   Neurological: Negative.   Hematological: Negative.   Psychiatric/Behavioral: Positive for sleep disturbance and dysphoric mood.  All other systems reviewed and are negative.    BP 114/78  Pulse 78  Ht 5\' 4"  (1.626 m)  Wt 180 lb (81.647 kg)  BMI 30.90 kg/m2  Physical Exam  Nursing note and vitals reviewed. Constitutional: She is oriented to person, place, and time. She appears well-developed and well-nourished.  HENT:  Head: Normocephalic.  Nose: Nose normal.  Mouth/Throat:  Oropharynx is clear and moist.  Eyes: Conjunctivae are normal. Pupils are equal, round, and reactive to light.  Neck: Normal range of motion. Neck supple. No JVD present.  Cardiovascular: Normal rate, regular rhythm, S1 normal, S2 normal and intact distal pulses.  Exam reveals no gallop and no friction rub.   Murmur heard.  Crescendo systolic murmur is present with a grade of 2/6  Pulmonary/Chest: Effort normal and breath sounds normal. No respiratory distress. She has no wheezes. She has no rales. She exhibits no tenderness.  Abdominal: Soft. Bowel sounds are normal. She exhibits no distension. There is no tenderness.  Musculoskeletal: Normal range of motion. She exhibits edema. She exhibits no tenderness.  Lymphadenopathy:    She has no cervical adenopathy.  Neurological: She is alert and oriented to person, place, and time. Coordination normal.  Skin: Skin is warm and dry. No rash noted. No erythema.  Psychiatric: She has a normal mood and affect. Her behavior is normal. Judgment and thought content normal.         Assessment and Plan

## 2011-03-05 NOTE — Assessment & Plan Note (Signed)
Shortness of breath from underlying pulmonary hypertension and diastolic dysfunction. We will try slow advancement of her diuretic regimen.

## 2011-03-05 NOTE — Patient Instructions (Addendum)
Please start metolazone 2.5 mg three times a week. Please take this medication 30 minutes before Lasix in the AM Please call us if you have new issues that need to be addressed before your next appt.  Please have BMP checked at your appt with Dr. Alphonsus Sias in 2 weeks. Your physician recommends that you schedule a follow-up appointment in: 1 month

## 2011-03-05 NOTE — Assessment & Plan Note (Signed)
Paced rhythm, on beta blockers

## 2011-03-13 ENCOUNTER — Other Ambulatory Visit (INDEPENDENT_AMBULATORY_CARE_PROVIDER_SITE_OTHER): Payer: Medicare Other | Admitting: *Deleted

## 2011-03-13 ENCOUNTER — Telehealth: Payer: Self-pay | Admitting: *Deleted

## 2011-03-13 DIAGNOSIS — I509 Heart failure, unspecified: Secondary | ICD-10-CM

## 2011-03-13 DIAGNOSIS — I5032 Chronic diastolic (congestive) heart failure: Secondary | ICD-10-CM

## 2011-03-13 NOTE — Telephone Encounter (Signed)
Pt called today stating that she has taken 2 doses of metolazone over past week and she has dropped wt from 180lb to 164lb today. She states her swelling in legs has completely resolved. Pt's urine output has decreased. She was given metolazone 2.5mg  3 times a week in addition to lasix 80mg  bid (already on as of ov on 02/08/11). Per Dr.Gollan, pt will hold metolazone unless weight is >170.  She will also DECREASE Lasix to 80mg  ONCE DAILY IN AM ONLY. If  weight continues to decrease, hold Lasix as well, and only restart after weight at or above goal. Goal weight is 165lbs.  Pt came in for BMP today, will call with results tomorrow AM.

## 2011-03-13 NOTE — Patient Instructions (Addendum)
Hold Metolazone and ONLY take once daily 30 minutes prior to Lasix AS NEEDED for Weight >170lbs.  DECREASE Lasix to 80mg  ONCE DAILY IN AM ONLY. If your weight continues to decrease, hold Lasix as well, and only restart after weight at or above goal.  Your goal weight is 165lbs.   We will call with your lab results and let you know if this changes.  Please call our office with any questions or issues in the meantime.

## 2011-03-14 ENCOUNTER — Other Ambulatory Visit: Payer: Medicare Other | Admitting: *Deleted

## 2011-03-14 LAB — BASIC METABOLIC PANEL
BUN: 48 mg/dL — ABNORMAL HIGH (ref 6–23)
Calcium: 9.5 mg/dL (ref 8.4–10.5)
Creat: 1.99 mg/dL — ABNORMAL HIGH (ref 0.50–1.10)
Glucose, Bld: 119 mg/dL — ABNORMAL HIGH (ref 70–99)
Potassium: 3.6 mEq/L (ref 3.5–5.3)

## 2011-03-20 ENCOUNTER — Encounter: Payer: Self-pay | Admitting: Internal Medicine

## 2011-03-20 ENCOUNTER — Ambulatory Visit (INDEPENDENT_AMBULATORY_CARE_PROVIDER_SITE_OTHER): Payer: Medicare Other | Admitting: Internal Medicine

## 2011-03-20 DIAGNOSIS — G589 Mononeuropathy, unspecified: Secondary | ICD-10-CM

## 2011-03-20 DIAGNOSIS — I5032 Chronic diastolic (congestive) heart failure: Secondary | ICD-10-CM

## 2011-03-20 DIAGNOSIS — I509 Heart failure, unspecified: Secondary | ICD-10-CM

## 2011-03-20 DIAGNOSIS — F329 Major depressive disorder, single episode, unspecified: Secondary | ICD-10-CM | POA: Insufficient documentation

## 2011-03-20 DIAGNOSIS — M109 Gout, unspecified: Secondary | ICD-10-CM

## 2011-03-20 MED ORDER — MIRTAZAPINE 7.5 MG PO TABS: 7.5000 mg | ORAL_TABLET | Freq: Every day | ORAL | Status: DC

## 2011-03-20 NOTE — Progress Notes (Signed)
Subjective:    Patient ID: Carrie [name], female    DOB: [birth year], 77 y.o.   MRN: [id number]  HPI Still doesn't feel good Thinks her nerves may be acting up or some depression "it is a terrible feeling I have inside of me.... I just think, why go on?"  Can't tolerate the gabapentin Up at night without it due to leg pain  Breathing isn't bad but feels the "terrible feeling inside" is affecting her Took the zaroxlyn 3 days and it drew off so much fluid she didn't need more Has held lasix for last 2 weeks due to the dehydration  Checks weight daily--was down to 164# after zaroxlyn Now 167# at home  Current Outpatient Prescriptions on File Prior to Visit  Medication Sig Dispense Refill  . allopurinol (ZYLOPRIM) 100 MG tablet Take 1-2 tablets (100-200 mg total) by mouth daily.  60 tablet  5  . benazepril (LOTENSIN) 20 MG tablet Take 1 tablet (20 mg total) by mouth daily.  30 tablet  6  . cholecalciferol (VITAMIN D) 1000 UNITS tablet Take 1,000 Units by mouth daily.        . colchicine 0.6 MG tablet 1 tab by mouth two times a day as needed for gout pain       . furosemide (LASIX) 80 MG tablet Take 80 mg by mouth 2 (two) times daily.        . Lactulose SOLN 15-30 cc's twice a day as needed for constipation.  1 Bottle  3  . metoprolol (LOPRESSOR) 25 MG tablet Take 25 mg by mouth daily.        . nitroGLYCERIN (NITROSTAT) 0.4 MG SL tablet Place 0.4 mg under the tongue. Place 1 tablet under tongue as directed         . traZODone (DESYREL) 50 MG tablet take 2 tablets at bedtime to help sleep      . warfarin (COUMADIN) 4 MG tablet Take 1 tablet (4 mg total) by mouth as directed.  30 tablet  3    Allergies  Allergen Reactions  . Meperidine Hcl     REACTION: unspecified  . Sulfamethoxazole     REACTION: unspecified  . Tramadol Hcl     REACTION: nausea    Past Medical History  Diagnosis Date  . Atrial fibrillation   . Diastolic heart failure     mild volume overload and increased  symptoms of shortness of breath  . DJD (degenerative joint disease) of lumbar spine   . Degenerative joint disease   . HTN (hypertension)   . History of recurrent UTIs     Dr. Artis Flock   . Sleep disturbance   . Neuropathy   . Gout     Past Surgical History  Procedure Date  . Ventricular ablation surgery     atrioventricular nodal ablation  . Ventricular cardiac pacemaker insertion     [employer]. Jude Victory  Pacemaker programmed to ventricular demand pacing  . Knee surgery     right  . Appendectomy   . Breast biopsy   . Pulse generator implant     placement of internal pulse generator for permanent spinal cord- stimulartor left hip    Family History  Problem Relation Age of Onset  . Heart disease Mother     heart failure  . Stroke Father     History   Social History  . Marital Status: Married    Spouse Name: N/A    Number of Children: 2  .  Years of Education: N/A   Occupational History  . homemaker    Social History Main Topics  . Smoking status: Former Smoker -- 1.0 packs/day    Types: Cigarettes  . Smokeless tobacco: Not on file   Comment: quit long ago   . Alcohol Use: 0.5 oz/week    1 drink(s) per week  . Drug Use: No  . Sexually Active: Not on file   Other Topics Concern  . Not on file   Social History Narrative   Son Rosanne Ashing holds health care power of attorney. Does request DNR. No tube feeds in cognitively unaware   Review of Systems Eating "fair"---appetite is off and "nothing is right" Still feels nervous and shaky Bowels have been fine     Objective:   Physical Exam  Constitutional: She appears well-developed and well-nourished. No distress.  Neck: Normal range of motion. Neck supple. No thyromegaly present.  Cardiovascular: Normal rate, regular rhythm and normal heart sounds.  Exam reveals no gallop.   No murmur heard. Pulmonary/Chest: Effort normal and breath sounds normal. No respiratory distress. She has no wheezes. She has no rales.    Abdominal: Soft. There is no tenderness.  Musculoskeletal: She exhibits no edema and no tenderness.  Lymphadenopathy:    She has no cervical adenopathy.  Psychiatric:       Nervous and depressed Has had memory issues and trouble getting words out (though not that evident here)          Assessment & Plan:

## 2011-03-20 NOTE — Assessment & Plan Note (Signed)
Improved Has diuresed now and has been off lasix even Will restart daily and monitor weight

## 2011-03-20 NOTE — Assessment & Plan Note (Signed)
Has been nervous, etc and concerned this is side effect from gabapentin. Possible though could be from apparent depression Will try off gabapentin and use mirtazapine for mood and sleep

## 2011-03-20 NOTE — Assessment & Plan Note (Signed)
Multifactorial---medical problems, sleep disturbance Clearly some anhedonia Appetite poor Sleeping poorly  Will try mirtazapine Stop the gabapentin Close follow up

## 2011-03-20 NOTE — Patient Instructions (Signed)
Stop the gabapentin Please start the mirtazapine at bedtime. Call if you have any problems. If you are still sleepy in the morning, try taking it about 1-2 hours before bedtime. Please restart the lasix at 80mg  daily. Don't take the medicine if your home weight is under 165#

## 2011-03-20 NOTE — Assessment & Plan Note (Signed)
Back to taking colchicine with the allopurinol for preventive

## 2011-03-21 ENCOUNTER — Telehealth: Payer: Self-pay | Admitting: *Deleted

## 2011-03-21 LAB — BASIC METABOLIC PANEL
Calcium: 9.7 mg/dL (ref 8.4–10.5)
GFR: 54.9 mL/min — ABNORMAL LOW (ref >60.00)
Glucose, Bld: 119 mg/dL — ABNORMAL HIGH (ref 70–99)
Potassium: 5.2 mEq/L — ABNORMAL HIGH (ref 3.5–5.1)
Sodium: 131 mEq/L — ABNORMAL LOW (ref 135–145)

## 2011-03-21 NOTE — Telephone Encounter (Signed)
Spoke with patient and advised results, patient understood the directions as 1/2 mirtazepine and 2 trazodone at bedtime is that correct?

## 2011-03-21 NOTE — Telephone Encounter (Signed)
Pt was given mirtazepine yesterday to help sleep.  She is asking if she is to continue taking the trazodone at night also.  She doesn't think that is helping her to sleep.  Please advise.

## 2011-03-21 NOTE — Telephone Encounter (Signed)
It is supposed to be 7.5mg  of mirtazapine If they filled it with the 15mg  size, then it is 1/2 tab.

## 2011-03-21 NOTE — Telephone Encounter (Signed)
Spoke with patient and she got the 15mg  tabs and she will take 1/2.

## 2011-03-21 NOTE — Telephone Encounter (Signed)
Have her continue it for now--just in case it is helping some If she starts feeling better and sleeping okay, we can try to reduce the medications at that point

## 2011-03-24 ENCOUNTER — Telehealth: Payer: Self-pay | Admitting: *Deleted

## 2011-03-24 NOTE — Telephone Encounter (Signed)
Have her increase to the full pill nightly if no effect after 1 week

## 2011-03-24 NOTE — Telephone Encounter (Signed)
Pt was started on mirtazepine on Friday and was told to call back today with report, she is taking one half tablet along with her trazodone as instructed.  She cant tell any difference with this, she is not getting any rest and her depression is no different.  I advised pt that she needs to give this more time, she said she will do that.

## 2011-03-24 NOTE — Telephone Encounter (Signed)
Spoke with patient and advised results   

## 2011-03-26 ENCOUNTER — Ambulatory Visit (INDEPENDENT_AMBULATORY_CARE_PROVIDER_SITE_OTHER): Payer: Medicare Other | Admitting: Emergency Medicine

## 2011-03-26 ENCOUNTER — Telehealth: Payer: Self-pay | Admitting: *Deleted

## 2011-03-26 DIAGNOSIS — I4891 Unspecified atrial fibrillation: Secondary | ICD-10-CM

## 2011-03-26 LAB — POCT INR: INR: 2.4

## 2011-03-26 NOTE — Telephone Encounter (Signed)
Spoke with patient and advised results   

## 2011-03-26 NOTE — Telephone Encounter (Signed)
Message copied by Sueanne Margarita on Wed Mar 26, 2011  9:48 AM ------      Message from: Tillman Abide I      Created: Sat Mar 22, 2011  4:02 PM       Please call      The blood work is generally okay but the sodium is borderline low and the potassium is borderline high. These may be responses from that high powered fluid pill she was on. She should restart the once a day lasix as we discussed and we will recheck these tests at her follow up visit

## 2011-03-26 NOTE — Telephone Encounter (Signed)
It is still too soon to tell if the mirtazapine is going to help She should take a full tab nightly now She should call if she is not doing any better within about 2 weeks

## 2011-03-26 NOTE — Telephone Encounter (Signed)
Spoke with patient and advised results about labs, per patient she's not doing any better with her sleep and depression, pt states she stays in bed but don't go to sleep for a long time. Pt states things are not looking good. I asked pt if she thought the medication was doing any good and she states maybe a little but she's not better. Please advise.

## 2011-04-04 ENCOUNTER — Ambulatory Visit: Payer: Medicare Other | Admitting: Cardiovascular Disease

## 2011-04-04 ENCOUNTER — Other Ambulatory Visit: Payer: Self-pay | Admitting: *Deleted

## 2011-04-04 ENCOUNTER — Ambulatory Visit (INDEPENDENT_AMBULATORY_CARE_PROVIDER_SITE_OTHER): Payer: Medicare Other | Admitting: Cardiovascular Disease

## 2011-04-04 ENCOUNTER — Encounter: Payer: Self-pay | Admitting: Cardiovascular Disease

## 2011-04-04 DIAGNOSIS — R0602 Shortness of breath: Secondary | ICD-10-CM

## 2011-04-04 DIAGNOSIS — R609 Edema, unspecified: Secondary | ICD-10-CM

## 2011-04-04 DIAGNOSIS — I272 Pulmonary hypertension, unspecified: Secondary | ICD-10-CM

## 2011-04-04 DIAGNOSIS — I2789 Other specified pulmonary heart diseases: Secondary | ICD-10-CM

## 2011-04-04 DIAGNOSIS — F329 Major depressive disorder, single episode, unspecified: Secondary | ICD-10-CM

## 2011-04-04 DIAGNOSIS — I4891 Unspecified atrial fibrillation: Secondary | ICD-10-CM

## 2011-04-04 MED ORDER — MIRTAZAPINE 15 MG PO TABS: 15.0000 mg | ORAL_TABLET | Freq: Every day | ORAL | Status: DC

## 2011-04-04 NOTE — Assessment & Plan Note (Signed)
Edema has significantly improved with her 15-20 pound weight loss. As above, goal weight 162-165 pounds.

## 2011-04-04 NOTE — Patient Instructions (Signed)
You are doing well. No medication changes were made. Try to keep your weight between 162 and 165. Please call us if you have new issues that need to be addressed before your next appt.  We will call you for a follow up Appt. In 6 months

## 2011-04-04 NOTE — Assessment & Plan Note (Signed)
History of atrial fibrillation, AV node ablation. Underlying Diastolic dysfunction. Continue on Lasix.

## 2011-04-04 NOTE — Assessment & Plan Note (Signed)
She continues to have a dysphoric mood, has difficulty sleeping with nightmares. She is working with Dr. Alphonsus Sias on different medication options. She reports she is a little better though still has significant problems.

## 2011-04-04 NOTE — Telephone Encounter (Signed)
rx sent to pharmacy by e-script  

## 2011-04-04 NOTE — Assessment & Plan Note (Signed)
Shortness of breath significantly better after diuresis. Continue current dose of diuretic.

## 2011-04-04 NOTE — Assessment & Plan Note (Signed)
Pulmonary hypertension by previous echo, now with significant weight loss. Breathing is much better and she appears to be much happier. She appears to found the right diuretic dose in the right weight for her which is approximately 162 pounds to 165 pounds. We have asked her to try to keep her weight approximately in this range.

## 2011-04-04 NOTE — Progress Notes (Signed)
Patient ID: Carrie [name], female    DOB: [birth year], 77 y.o.   MRN: [id number]  HPI Comments: 77 year old white female patient with a h/o diastolic heart failure,  pneumonia 01/2010, severe pulmonary hypertension on recent echo,  long history of back problems requiring surgery,  chronic atrial fibrillation with AV node ablation, placement of pacemaker, who presents for followup. On her last clinic visit, he started her on aggressive diuresis for significant edema and weight gain. She presents today for followup.  She reports that she has had at least 15, possibly 20 pounds of weight loss since her last visit one month ago. Her edema has significantly improved. She is currently taking Lasix 80 mg daily. She could not wear TED hose as they were too tight. She is now off gabapentin which she believed was causing problems before. She continues to have terrible problems with sleep at night time.   Old EKG shows ventricularly paced rhythm with rate 70 beats per minute.     Outpatient Encounter Prescriptions as of 04/04/2011  Medication Sig Dispense Refill  . allopurinol (ZYLOPRIM) 100 MG tablet Take 1-2 tablets (100-200 mg total) by mouth daily.  60 tablet  5  . benazepril (LOTENSIN) 20 MG tablet Take 1 tablet (20 mg total) by mouth daily.  30 tablet  6  . cholecalciferol (VITAMIN D) 1000 UNITS tablet Take 1,000 Units by mouth daily.        . colchicine 0.6 MG tablet 1 tab by mouth two times a day as needed for gout pain       . furosemide (LASIX) 80 MG tablet Take 80 mg by mouth daily. Hold if your weight at home is under 165#      . Lactulose SOLN 15-30 cc's twice a day as needed for constipation.  1 Bottle  3  . metoprolol (LOPRESSOR) 25 MG tablet Take 25 mg by mouth daily.        . nitroGLYCERIN (NITROSTAT) 0.4 MG SL tablet Place 0.4 mg under the tongue. Place 1 tablet under tongue as directed         . traZODone (DESYREL) 50 MG tablet take 2 tablets at bedtime to help sleep      . warfarin  (COUMADIN) 4 MG tablet Take 1 tablet (4 mg total) by mouth as directed.  30 tablet  3  . DISCONTD: mirtazapine (REMERON) 7.5 MG tablet Take 1 tablet (7.5 mg total) by mouth at bedtime.  30 tablet  5     Review of Systems  Constitutional: Negative.   HENT: Negative.   Eyes: Negative.   Respiratory: Negative.   Cardiovascular: Negative.   Gastrointestinal: Negative.   Musculoskeletal: Positive for back pain, joint swelling and arthralgias.  Skin: Negative.   Neurological: Negative.   Hematological: Negative.   Psychiatric/Behavioral: Positive for sleep disturbance and dysphoric mood.  All other systems reviewed and are negative.    BP 123/74  Pulse 71  Ht 5\' 4"  (1.626 m)  Wt 164 lb (74.39 kg)  BMI 28.15 kg/m2  Physical Exam  Nursing note and vitals reviewed. Constitutional: She is oriented to person, place, and time. She appears well-developed and well-nourished.  HENT:  Head: Normocephalic.  Nose: Nose normal.  Mouth/Throat: Oropharynx is clear and moist.  Eyes: Conjunctivae are normal. Pupils are equal, round, and reactive to light.  Neck: Normal range of motion. Neck supple. No JVD present.  Cardiovascular: Normal rate, regular rhythm, S1 normal, S2 normal and intact distal pulses.  Exam reveals  no gallop and no friction rub.   Murmur heard.  Crescendo systolic murmur is present with a grade of 2/6  Pulmonary/Chest: Effort normal and breath sounds normal. No respiratory distress. She has no wheezes. She has no rales. She exhibits no tenderness.  Abdominal: Soft. Bowel sounds are normal. She exhibits no distension. There is no tenderness.  Musculoskeletal: Normal range of motion. She exhibits no edema and no tenderness.  Lymphadenopathy:    She has no cervical adenopathy.  Neurological: She is alert and oriented to person, place, and time. Coordination normal.  Skin: Skin is warm and dry. No rash noted. No erythema.  Psychiatric: She has a normal mood and affect. Her  behavior is normal. Judgment and thought content normal.         Assessment and Plan

## 2011-04-21 ENCOUNTER — Encounter: Payer: Self-pay | Admitting: Internal Medicine

## 2011-04-21 ENCOUNTER — Ambulatory Visit (INDEPENDENT_AMBULATORY_CARE_PROVIDER_SITE_OTHER): Payer: Medicare Other | Admitting: Internal Medicine

## 2011-04-21 DIAGNOSIS — G589 Mononeuropathy, unspecified: Secondary | ICD-10-CM

## 2011-04-21 DIAGNOSIS — G479 Sleep disorder, unspecified: Secondary | ICD-10-CM

## 2011-04-21 DIAGNOSIS — F329 Major depressive disorder, single episode, unspecified: Secondary | ICD-10-CM

## 2011-04-21 MED ORDER — FUROSEMIDE 80 MG PO TABS: 80.0000 mg | ORAL_TABLET | Freq: Every day | ORAL | Status: DC

## 2011-04-21 MED ORDER — NORTRIPTYLINE HCL 10 MG PO CAPS: 10.0000 mg | ORAL_CAPSULE | Freq: Every day | ORAL | Status: DC

## 2011-04-21 NOTE — Assessment & Plan Note (Signed)
Could not tolerate gabapentin though it did help feet some Will try nortriptylline cautiously

## 2011-04-21 NOTE — Patient Instructions (Signed)
Please stop the trazodone Start nortriptylline tonight with 1 capsule at bedtime. If foot pain continues, but no side effects, increase to 2 capsules in 3 days (8/16), 3 capsules at bedtime in 6 days (8/19), 4 capsules on 8/22 and even 5 capsules on 8/25 Call if any serious side effects

## 2011-04-21 NOTE — Assessment & Plan Note (Signed)
Somewhat better on the mirtazapine Big problem still is lack of sleep  Will address neuropathy also

## 2011-04-21 NOTE — Assessment & Plan Note (Signed)
Trazodone doesn't seem to be helping Will stop and add tricyclic in hopes of helping neuropathy also Continue mirtazapine

## 2011-04-21 NOTE — Progress Notes (Signed)
Subjective:    Patient ID: Carrie [name], female    DOB: 1920-02-19, 77 y.o.   MRN: [id number]  HPI Still not sleeping well despite 2 trazodone and the mirtazapine Doesn't really feel good still Awakens after 2 hours and then is wide awake  Feet are hurting again  Trouble keeping feet still Can't keep them under covers---it hurts Feels she can't go back on gabapentin though----thinking is now clear, shakes gone  The depression is better--but "still not okay" Especially frustrated when she doesn't sleep Still feels "why go on.... What do I have to live for?"  Breathing is better Still no fluid in legs No chest pain Occ brief palpitations  Current Outpatient Prescriptions on File Prior to Visit  Medication Sig Dispense Refill  . allopurinol (ZYLOPRIM) 100 MG tablet Take 1-2 tablets (100-200 mg total) by mouth daily.  60 tablet  5  . benazepril (LOTENSIN) 20 MG tablet Take 1 tablet (20 mg total) by mouth daily.  30 tablet  6  . cholecalciferol (VITAMIN D) 1000 UNITS tablet Take 1,000 Units by mouth daily.        . colchicine 0.6 MG tablet 1 tab by mouth two times a day as needed for gout pain       . furosemide (LASIX) 80 MG tablet Take 80 mg by mouth daily. Hold if your weight at home is under 165#      . Lactulose SOLN 15-30 cc's twice a day as needed for constipation.  1 Bottle  3  . metoprolol (LOPRESSOR) 25 MG tablet Take 25 mg by mouth daily.        . mirtazapine (REMERON) 15 MG tablet Take 1 tablet (15 mg total) by mouth at bedtime.  30 tablet  5  . nitroGLYCERIN (NITROSTAT) 0.4 MG SL tablet Place 0.4 mg under the tongue. Place 1 tablet under tongue as directed         . traZODone (DESYREL) 50 MG tablet take 2 tablets at bedtime to help sleep      . warfarin (COUMADIN) 4 MG tablet Take 1 tablet (4 mg total) by mouth as directed.  30 tablet  3    Allergies  Allergen Reactions  . Meperidine Hcl     REACTION: unspecified  . Sulfamethoxazole     REACTION: unspecified   . Tramadol Hcl     REACTION: nausea    Past Medical History  Diagnosis Date  . Atrial fibrillation   . Diastolic heart failure     mild volume overload and increased symptoms of shortness of breath  . DJD (degenerative joint disease) of lumbar spine   . Degenerative joint disease   . HTN (hypertension)   . History of recurrent UTIs     Dr. Artis Flock   . Sleep disturbance   . Neuropathy   . Gout   . Depression     Past Surgical History  Procedure Date  . Ventricular ablation surgery     atrioventricular nodal ablation  . Ventricular cardiac pacemaker insertion     [employer]. Jude Victory  Pacemaker programmed to ventricular demand pacing  . Knee surgery     right  . Appendectomy   . Breast biopsy   . Pulse generator implant     placement of internal pulse generator for permanent spinal cord- stimulartor left hip    Family History  Problem Relation Age of Onset  . Heart disease Mother     heart failure  . Stroke Father  History   Social History  . Marital Status: Married    Spouse Name: N/A    Number of Children: 2  . Years of Education: N/A   Occupational History  . homemaker    Social History Main Topics  . Smoking status: Former Smoker -- 1.0 packs/day    Types: Cigarettes  . Smokeless tobacco: Never Used   Comment: quit long ago   . Alcohol Use: 0.5 oz/week    1 drink(s) per week  . Drug Use: No  . Sexually Active: Not on file   Other Topics Concern  . Not on file   Social History Narrative   Son Rosanne Ashing holds health care power of attorney. Does request DNR. No tube feeds in cognitively unaware   Review of Systems Anxiety comes on when she can't sleep Appetite is better now    Objective:   Physical Exam  Constitutional: She appears well-developed and well-nourished. No distress.  Neck: Normal range of motion.  Cardiovascular: Normal rate and normal heart sounds.  Exam reveals no gallop.   No murmur heard.      irregular  Pulmonary/Chest: Effort  normal and breath sounds normal. No respiratory distress. She has no wheezes. She has no rales.  Abdominal: Soft. There is no tenderness.  Musculoskeletal:       Trace pedal edema Venous stasis changes  Lymphadenopathy:    She has no cervical adenopathy.  Skin:       No ulcers  Psychiatric: Her behavior is normal. Thought content normal.       Mildly depressed still Appropriate affect          Assessment & Plan:

## 2011-04-23 ENCOUNTER — Ambulatory Visit (INDEPENDENT_AMBULATORY_CARE_PROVIDER_SITE_OTHER): Payer: Medicare Other | Admitting: Emergency Medicine

## 2011-04-23 ENCOUNTER — Encounter: Payer: Medicare Other | Admitting: Emergency Medicine

## 2011-04-23 DIAGNOSIS — I4891 Unspecified atrial fibrillation: Secondary | ICD-10-CM

## 2011-05-19 ENCOUNTER — Encounter: Payer: Self-pay | Admitting: Internal Medicine

## 2011-05-19 DIAGNOSIS — I495 Sick sinus syndrome: Secondary | ICD-10-CM

## 2011-05-20 ENCOUNTER — Ambulatory Visit: Payer: Medicare Other | Admitting: Internal Medicine

## 2011-05-21 ENCOUNTER — Ambulatory Visit (INDEPENDENT_AMBULATORY_CARE_PROVIDER_SITE_OTHER): Payer: Medicare Other | Admitting: Emergency Medicine

## 2011-05-21 DIAGNOSIS — I4891 Unspecified atrial fibrillation: Secondary | ICD-10-CM

## 2011-05-23 ENCOUNTER — Ambulatory Visit (INDEPENDENT_AMBULATORY_CARE_PROVIDER_SITE_OTHER): Payer: Medicare Other | Admitting: Internal Medicine

## 2011-05-23 ENCOUNTER — Encounter: Payer: Self-pay | Admitting: Internal Medicine

## 2011-05-23 VITALS — BP 129/77 | HR 77 | Temp 98.2°F | Ht 64.0 in | Wt 169.0 lb

## 2011-05-23 DIAGNOSIS — I509 Heart failure, unspecified: Secondary | ICD-10-CM

## 2011-05-23 DIAGNOSIS — I5032 Chronic diastolic (congestive) heart failure: Secondary | ICD-10-CM

## 2011-05-23 DIAGNOSIS — Z23 Encounter for immunization: Secondary | ICD-10-CM

## 2011-05-23 DIAGNOSIS — M109 Gout, unspecified: Secondary | ICD-10-CM

## 2011-05-23 DIAGNOSIS — G479 Sleep disorder, unspecified: Secondary | ICD-10-CM

## 2011-05-23 MED ORDER — CLONAZEPAM 0.5 MG PO TABS: 0.2500 mg | ORAL_TABLET | Freq: Two times a day (BID) | ORAL | Status: DC | PRN

## 2011-05-23 NOTE — Progress Notes (Signed)
Subjective:    Patient ID: Carrie [name], female    DOB: 05/16/1920, 77 y.o.   MRN: [id number]  HPI "I'm better in a lot of ways" Still not sleeping well, not resting Tried the nortriptylline---had "terrible feeling" Would not things over and felt heart was racing so stopped after about 3 days  Still on mirtazapine Still gets sensation of legs moving around Has had to get out of bed all but one night in past week---will go to chair or couch. May get 4 hours of sleep  Tired when goes to bed, but then mind springs awake Now "I hate the thought of going to bed" Not napping much in day  ambien and xanax have helped sleep in the past (?along with pain relievers in health care and hospital)  Fluid has stayed down in legs Breathing is great  Stopped the colchicine Right foot acted up then Improved back on colchicine bid Quieted down so has cut down to 1 Still on the allopurinol  Current Outpatient Prescriptions on File Prior to Visit  Medication Sig Dispense Refill  . allopurinol (ZYLOPRIM) 100 MG tablet Take 1-2 tablets (100-200 mg total) by mouth daily.  60 tablet  5  . benazepril (LOTENSIN) 20 MG tablet Take 1 tablet (20 mg total) by mouth daily.  30 tablet  6  . cholecalciferol (VITAMIN D) 1000 UNITS tablet Take 1,000 Units by mouth daily.        . colchicine 0.6 MG tablet 1 tab by mouth two times a day as needed for gout pain       . furosemide (LASIX) 80 MG tablet Take 1 tablet (80 mg total) by mouth daily. Hold if your weight at home is under 165#  30 tablet  11  . Lactulose SOLN 15-30 cc's twice a day as needed for constipation.  1 Bottle  3  . metoprolol (LOPRESSOR) 25 MG tablet Take 25 mg by mouth daily.        . mirtazapine (REMERON) 15 MG tablet Take 1 tablet (15 mg total) by mouth at bedtime.  30 tablet  5  . nitroGLYCERIN (NITROSTAT) 0.4 MG SL tablet Place 0.4 mg under the tongue. Place 1 tablet under tongue as directed         . warfarin (COUMADIN) 4 MG tablet Take  1 tablet (4 mg total) by mouth as directed.  30 tablet  3    Allergies  Allergen Reactions  . Gabapentin     Felt terrible, tremors, etc  . Meperidine Hcl     REACTION: unspecified  . Sulfamethoxazole     REACTION: unspecified  . Tramadol Hcl     REACTION: nausea    Past Medical History  Diagnosis Date  . Atrial fibrillation   . Diastolic heart failure     mild volume overload and increased symptoms of shortness of breath  . DJD (degenerative joint disease) of lumbar spine   . Degenerative joint disease   . HTN (hypertension)   . History of recurrent UTIs     Dr. Artis Flock   . Sleep disturbance   . Neuropathy   . Gout   . Depression     Past Surgical History  Procedure Date  . Ventricular ablation surgery     atrioventricular nodal ablation  . Ventricular cardiac pacemaker insertion     [employer]. Jude Victory  Pacemaker programmed to ventricular demand pacing  . Knee surgery     right  . Appendectomy   . Breast  biopsy   . Pulse generator implant     placement of internal pulse generator for permanent spinal cord- stimulartor left hip    Family History  Problem Relation Age of Onset  . Heart disease Mother     heart failure  . Stroke Father     History   Social History  . Marital Status: Married    Spouse Name: N/A    Number of Children: 2  . Years of Education: N/A   Occupational History  . homemaker    Social History Main Topics  . Smoking status: Former Smoker -- 1.0 packs/day    Types: Cigarettes  . Smokeless tobacco: Never Used   Comment: quit long ago   . Alcohol Use: 0.5 oz/week    1 drink(s) per week  . Drug Use: No  . Sexually Active: Not on file   Other Topics Concern  . Not on file   Social History Narrative   Son Rosanne Ashing holds health care power of attorney. Does request DNR. No tube feeds in cognitively unaware   Review of Systems Appetite is better now Weight is stable    Objective:   Physical Exam  Constitutional: She appears  well-developed and well-nourished. No distress.  Neck: Normal range of motion. Neck supple.  Cardiovascular: Normal rate, regular rhythm and normal heart sounds.  Exam reveals no gallop.   No murmur heard. Pulmonary/Chest: Effort normal and breath sounds normal. No respiratory distress. She has no wheezes. She has no rales.  Musculoskeletal: She exhibits no edema.       Joints are quiet  Lymphadenopathy:    She has no cervical adenopathy.  Psychiatric: She has a normal mood and affect. Her behavior is normal. Judgment and thought content normal.          Assessment & Plan:

## 2011-05-23 NOTE — Assessment & Plan Note (Signed)
True sleep anxiety now Leg movements are not as clearly a problem mirtazapine may have helped some (like with eating too) Will try clonazepam. Next would be xanax or [place]

## 2011-05-23 NOTE — Assessment & Plan Note (Signed)
Doing better now Weight is stable No changes needed

## 2011-05-23 NOTE — Assessment & Plan Note (Signed)
Quiet on combination of allopurinol and daily colchicine

## 2011-05-23 NOTE — Patient Instructions (Signed)
Call within 2 weeks if the new medicine is not helping you sleep. Be specific about the problem (side effects, wears off, morning grogginess)

## 2011-06-05 ENCOUNTER — Other Ambulatory Visit: Payer: Self-pay | Admitting: Internal Medicine

## 2011-06-17 LAB — PROTIME-INR: Prothrombin Time: 16.7 — ABNORMAL HIGH

## 2011-06-18 ENCOUNTER — Other Ambulatory Visit: Payer: Self-pay | Admitting: Internal Medicine

## 2011-06-18 ENCOUNTER — Encounter: Payer: Medicare Other | Admitting: Emergency Medicine

## 2011-06-18 ENCOUNTER — Ambulatory Visit (INDEPENDENT_AMBULATORY_CARE_PROVIDER_SITE_OTHER): Payer: Medicare Other | Admitting: Emergency Medicine

## 2011-06-18 DIAGNOSIS — I4891 Unspecified atrial fibrillation: Secondary | ICD-10-CM

## 2011-06-18 LAB — URINALYSIS, ROUTINE W REFLEX MICROSCOPIC
Ketones, ur: NEGATIVE
Protein, ur: NEGATIVE
Urobilinogen, UA: 1

## 2011-06-18 LAB — CBC
HCT: 42.2
Hemoglobin: 14.1
MCV: 92.9
Platelets: 192
WBC: 5.8

## 2011-06-18 LAB — DIFFERENTIAL
Basophils Absolute: 0
Basophils Relative: 1
Lymphocytes Relative: 25
Monocytes Absolute: 0.4
Neutro Abs: 3.8
Neutrophils Relative %: 65

## 2011-06-18 LAB — COMPREHENSIVE METABOLIC PANEL
Albumin: 3.9
Alkaline Phosphatase: 85
BUN: 21
Chloride: 100
Creatinine, Ser: 0.84
GFR calc non Af Amer: >60
Glucose, Bld: 140 — ABNORMAL HIGH
Total Bilirubin: 1

## 2011-06-18 LAB — TYPE AND SCREEN: Antibody Screen: NEGATIVE

## 2011-06-18 LAB — URINE MICROSCOPIC-ADD ON

## 2011-06-18 NOTE — Telephone Encounter (Signed)
rx called into pharmacy

## 2011-06-18 NOTE — Telephone Encounter (Signed)
Okay #30 x 1 

## 2011-07-08 ENCOUNTER — Encounter: Payer: Self-pay | Admitting: Internal Medicine

## 2011-07-09 ENCOUNTER — Encounter: Payer: Medicare Other | Admitting: Emergency Medicine

## 2011-07-09 ENCOUNTER — Ambulatory Visit (INDEPENDENT_AMBULATORY_CARE_PROVIDER_SITE_OTHER): Payer: Medicare Other | Admitting: Emergency Medicine

## 2011-07-09 DIAGNOSIS — I4891 Unspecified atrial fibrillation: Secondary | ICD-10-CM

## 2011-07-09 DIAGNOSIS — Z7901 Long term (current) use of anticoagulants: Secondary | ICD-10-CM

## 2011-07-09 LAB — POCT INR: INR: 2.1

## 2011-07-11 ENCOUNTER — Encounter: Payer: Self-pay | Admitting: Internal Medicine

## 2011-07-11 ENCOUNTER — Ambulatory Visit (INDEPENDENT_AMBULATORY_CARE_PROVIDER_SITE_OTHER): Payer: Medicare Other | Admitting: Internal Medicine

## 2011-07-11 DIAGNOSIS — Z950 Presence of cardiac pacemaker: Secondary | ICD-10-CM | POA: Insufficient documentation

## 2011-07-11 DIAGNOSIS — I442 Atrioventricular block, complete: Secondary | ICD-10-CM

## 2011-07-11 DIAGNOSIS — I495 Sick sinus syndrome: Secondary | ICD-10-CM

## 2011-07-11 DIAGNOSIS — I4891 Unspecified atrial fibrillation: Secondary | ICD-10-CM

## 2011-07-11 DIAGNOSIS — I509 Heart failure, unspecified: Secondary | ICD-10-CM

## 2011-07-11 DIAGNOSIS — I5032 Chronic diastolic (congestive) heart failure: Secondary | ICD-10-CM

## 2011-07-11 HISTORY — DX: Atrioventricular block, complete: I44.2

## 2011-07-11 LAB — PACEMAKER DEVICE OBSERVATION
BATTERY VOLTAGE: 2.59 V
BRDY-0002RV: 70 {beats}/min
BRDY-0004RV: 110 {beats}/min
DEVICE MODEL PM: 1508518
RV LEAD IMPEDENCE PM: 703 Ohm
RV LEAD THRESHOLD: 1 V
VENTRICULAR PACING PM: 98

## 2011-07-11 NOTE — Assessment & Plan Note (Signed)
permanent

## 2011-07-11 NOTE — Patient Instructions (Signed)
Follow up with Gunnar Fusi in 1 month with your coumadin appt as already discussed.

## 2011-07-11 NOTE — Assessment & Plan Note (Signed)
stable °

## 2011-07-11 NOTE — Assessment & Plan Note (Signed)
Stable post pacing 

## 2011-07-11 NOTE — Assessment & Plan Note (Addendum)
The patient's device was interrogated.  The information was reviewed. No changes were made in the programming.    Device is approaching ERI. We'll change her transtelephonic monitoring to monthly.

## 2011-07-11 NOTE — Progress Notes (Signed)
HPI  Carrie [name] is a 77 y.o. female Seen in follow up for afib, chronic,  S/p AV ablation and pacing.  She has stable chronic shortness of breath. Her device is approaching ERI. She has been followed long-term by Dr. Juanda Chance. Apparently I saw her almost 2 decades ago.  Past Medical History  Diagnosis Date  . Atrial fibrillation   . Diastolic heart failure     mild volume overload and increased symptoms of shortness of breath  . DJD (degenerative joint disease) of lumbar spine   . Degenerative joint disease   . HTN (hypertension)   . History of recurrent UTIs     Dr. Artis Flock   . Sleep disturbance   . Neuropathy   . Gout   . Depression     Past Surgical History  Procedure Date  . Ventricular ablation surgery     atrioventricular nodal ablation  . Ventricular cardiac pacemaker insertion     [employer] Jude Victory  Pacemaker programmed to ventricular demand pacing  . Knee surgery     right  . Appendectomy   . Breast biopsy   . Pulse generator implant     placement of internal pulse generator for permanent spinal cord- stimulartor left hip    Current Outpatient Prescriptions  Medication Sig Dispense Refill  . allopurinol (ZYLOPRIM) 100 MG tablet Take 1-2 tablets (100-200 mg total) by mouth daily.  60 tablet  5  . benazepril (LOTENSIN) 20 MG tablet Take 1 tablet (20 mg total) by mouth daily.  30 tablet  6  . cholecalciferol (VITAMIN D) 1000 UNITS tablet Take 1,000 Units by mouth daily.        . clonazePAM (KLONOPIN) 0.5 MG tablet TAKE ONE-HALF TO ONE TABLET AT BEDTIME  AS NEEDED  30 tablet  1  . COLCRYS 0.6 MG tablet TAKE 1 TABLET TWICE A DAY AS NEEDED     FOR GOUT PAIN  60 tablet  3  . furosemide (LASIX) 80 MG tablet Take 1 tablet (80 mg total) by mouth daily. Hold if your weight at home is under 165#  30 tablet  11  . Lactulose SOLN 15-30 cc's twice a day as needed for constipation.  1 Bottle  3  . metoprolol (LOPRESSOR) 25 MG tablet Take 25 mg by mouth daily.        .  mirtazapine (REMERON) 15 MG tablet Take 1 tablet (15 mg total) by mouth at bedtime.  30 tablet  5  . nitroGLYCERIN (NITROSTAT) 0.4 MG SL tablet Place 0.4 mg under the tongue. Place 1 tablet under tongue as directed         . warfarin (COUMADIN) 4 MG tablet Take 1 tablet (4 mg total) by mouth as directed.  30 tablet  3    Allergies  Allergen Reactions  . Gabapentin     Felt terrible, tremors, etc  . Meperidine Hcl     REACTION: unspecified  . Sulfamethoxazole     REACTION: unspecified  . Tramadol Hcl     REACTION: nausea    Review of Systems negative except from HPI and PMH  Physical Exam Well developed and well nourished in no acute distress walking with a cane HENT normal E scleral and icterus clear Neck Supple JVP flat; carotids brisk and full Clear to ausculation Regular rate and rhythm, no murmurs gallops or rub Soft with active bowel sounds No clubbing cyanosis and edema Alert and oriented, grossly normal motor and sensory function Skin Warm and Dry  Assessment and  Plan

## 2011-07-14 ENCOUNTER — Other Ambulatory Visit: Payer: Self-pay | Admitting: Internal Medicine

## 2011-07-14 ENCOUNTER — Ambulatory Visit (INDEPENDENT_AMBULATORY_CARE_PROVIDER_SITE_OTHER): Payer: Medicare Other | Admitting: Internal Medicine

## 2011-07-14 ENCOUNTER — Encounter: Payer: Self-pay | Admitting: Internal Medicine

## 2011-07-14 DIAGNOSIS — M109 Gout, unspecified: Secondary | ICD-10-CM

## 2011-07-14 DIAGNOSIS — I5032 Chronic diastolic (congestive) heart failure: Secondary | ICD-10-CM

## 2011-07-14 DIAGNOSIS — F329 Major depressive disorder, single episode, unspecified: Secondary | ICD-10-CM

## 2011-07-14 DIAGNOSIS — I10 Essential (primary) hypertension: Secondary | ICD-10-CM

## 2011-07-14 DIAGNOSIS — I509 Heart failure, unspecified: Secondary | ICD-10-CM

## 2011-07-14 DIAGNOSIS — G479 Sleep disorder, unspecified: Secondary | ICD-10-CM

## 2011-07-14 NOTE — Assessment & Plan Note (Signed)
Finally satisfied taking the clonazepam and the mirtazapine No changes

## 2011-07-14 NOTE — Assessment & Plan Note (Signed)
Quiet on combination of colchicine and allopurinol No changes

## 2011-07-14 NOTE — Assessment & Plan Note (Signed)
Doing well on mirtazapine Will continue

## 2011-07-14 NOTE — Progress Notes (Signed)
Subjective:    Patient ID: Carrie [name], female    DOB: February 10, 1920, 77 y.o.   MRN: [id number]  HPI Doing a lot better Has been able to sleep well with the clonazepam "It is so much better for me than anything else" No longer has sleep anxiety Still occ restless nights but not regularly  Gout has been quiet occ feels like her right foot might flare up Continues on allopurinol Takes the colchicine regularly daily  Heart seems to be okay Recent pacer check by Dr Graciela Husbands Battery is getting low though  No chest pain No SOB Functional status has been good  Current Outpatient Prescriptions on File Prior to Visit  Medication Sig Dispense Refill  . benazepril (LOTENSIN) 20 MG tablet Take 1 tablet (20 mg total) by mouth daily.  30 tablet  6  . cholecalciferol (VITAMIN D) 1000 UNITS tablet Take 1,000 Units by mouth daily.        . clonazePAM (KLONOPIN) 0.5 MG tablet TAKE ONE-HALF TO ONE TABLET AT BEDTIME  AS NEEDED  30 tablet  1  . COLCRYS 0.6 MG tablet TAKE 1 TABLET TWICE A DAY AS NEEDED     FOR GOUT PAIN  60 tablet  3  . furosemide (LASIX) 80 MG tablet Take 1 tablet (80 mg total) by mouth daily. Hold if your weight at home is under 165#  30 tablet  11  . Lactulose SOLN 15-30 cc's twice a day as needed for constipation.  1 Bottle  3  . metoprolol (LOPRESSOR) 25 MG tablet Take 25 mg by mouth daily.        . mirtazapine (REMERON) 15 MG tablet Take 1 tablet (15 mg total) by mouth at bedtime.  30 tablet  5  . nitroGLYCERIN (NITROSTAT) 0.4 MG SL tablet Place 0.4 mg under the tongue. Place 1 tablet under tongue as directed         . warfarin (COUMADIN) 4 MG tablet Take 1 tablet (4 mg total) by mouth as directed.  30 tablet  3    Allergies  Allergen Reactions  . Gabapentin     Felt terrible, tremors, etc  . Meperidine Hcl     REACTION: unspecified  . Sulfamethoxazole     REACTION: unspecified  . Tramadol Hcl     REACTION: nausea    Past Medical History  Diagnosis Date  . Atrial  fibrillation   . Diastolic heart failure     mild volume overload and increased symptoms of shortness of breath  . DJD (degenerative joint disease) of lumbar spine   . Degenerative joint disease   . HTN (hypertension)   . History of recurrent UTIs     Dr. Artis Flock   . Sleep disturbance   . Neuropathy   . Gout   . Depression     Past Surgical History  Procedure Date  . Ventricular ablation surgery     atrioventricular nodal ablation  . Ventricular cardiac pacemaker insertion     St. Jude Victory  Pacemaker programmed to ventricular demand pacing  . Knee surgery     right  . Appendectomy   . Breast biopsy   . Pulse generator implant     placement of internal pulse generator for permanent spinal cord- stimulartor left hip    Family History  Problem Relation Age of Onset  . Heart disease Mother     heart failure  . Stroke Father     History   Social History  . Marital  Status: Married    Spouse Name: N/A    Number of Children: 2  . Years of Education: N/A   Occupational History  . homemaker    Social History Main Topics  . Smoking status: Former Smoker -- 1.0 packs/day    Types: Cigarettes  . Smokeless tobacco: Never Used   Comment: quit long ago   . Alcohol Use: 0.5 oz/week    1 drink(s) per week  . Drug Use: No  . Sexually Active: Not on file   Other Topics Concern  . Not on file   Social History Narrative   Son Rosanne Ashing holds health care power of attorney. Does request DNR. No tube feeds in cognitively unaware   Review of Systems Appetite is good Weight stable Has occ used the zaroxolyn if she gains weight from dietary indiscretion with salt (like recently when she had visitors)     Objective:   Physical Exam  Constitutional: She appears well-developed and well-nourished. No distress.  Neck: Normal range of motion. Neck supple. No thyromegaly present.  Cardiovascular: Normal rate, regular rhythm and normal heart sounds.  Exam reveals no gallop.   No  murmur heard. Pulmonary/Chest: Effort normal and breath sounds normal. No respiratory distress. She has no wheezes. She has no rales.  Musculoskeletal: She exhibits no edema and no tenderness.  Lymphadenopathy:    She has no cervical adenopathy.  Skin:       Venous stasis changes in calves  Psychiatric: She has a normal mood and affect. Her behavior is normal. Judgment and thought content normal.          Assessment & Plan:

## 2011-07-14 NOTE — Assessment & Plan Note (Signed)
Compensated Weight has been controlled She monitors carefully at home and adjusts diuretics prn

## 2011-07-14 NOTE — Assessment & Plan Note (Signed)
BP Readings from Last 3 Encounters:  07/14/11 104/67  07/11/11 120/78  05/23/11 129/77   Good control No changes as meds are for CHF as well

## 2011-08-13 ENCOUNTER — Encounter: Payer: Self-pay | Admitting: Internal Medicine

## 2011-08-13 ENCOUNTER — Ambulatory Visit (INDEPENDENT_AMBULATORY_CARE_PROVIDER_SITE_OTHER): Payer: Medicare Other | Admitting: Emergency Medicine

## 2011-08-13 ENCOUNTER — Ambulatory Visit (INDEPENDENT_AMBULATORY_CARE_PROVIDER_SITE_OTHER): Payer: Medicare Other | Admitting: *Deleted

## 2011-08-13 ENCOUNTER — Encounter: Payer: Medicare Other | Admitting: Emergency Medicine

## 2011-08-13 DIAGNOSIS — I4891 Unspecified atrial fibrillation: Secondary | ICD-10-CM

## 2011-08-13 DIAGNOSIS — I442 Atrioventricular block, complete: Secondary | ICD-10-CM

## 2011-08-13 DIAGNOSIS — Z7901 Long term (current) use of anticoagulants: Secondary | ICD-10-CM

## 2011-08-13 LAB — PACEMAKER DEVICE OBSERVATION
BATTERY VOLTAGE: 2.55 V
BRDY-0004RV: 110 {beats}/min
RV LEAD IMPEDENCE PM: 748 Ohm

## 2011-08-13 NOTE — Progress Notes (Signed)
Battery check 

## 2011-08-18 ENCOUNTER — Other Ambulatory Visit: Payer: Self-pay | Admitting: Internal Medicine

## 2011-08-18 NOTE — Telephone Encounter (Signed)
rx called into pharmacy

## 2011-08-18 NOTE — Telephone Encounter (Signed)
Okay #30 x 1 

## 2011-09-01 ENCOUNTER — Other Ambulatory Visit: Payer: Self-pay | Admitting: Cardiovascular Disease

## 2011-09-01 NOTE — Telephone Encounter (Signed)
Refill sent for warfarin

## 2011-09-10 ENCOUNTER — Ambulatory Visit (INDEPENDENT_AMBULATORY_CARE_PROVIDER_SITE_OTHER): Payer: Medicare Other | Admitting: Emergency Medicine

## 2011-09-10 DIAGNOSIS — Z7901 Long term (current) use of anticoagulants: Secondary | ICD-10-CM

## 2011-09-10 DIAGNOSIS — I4891 Unspecified atrial fibrillation: Secondary | ICD-10-CM

## 2011-09-10 LAB — POCT INR: INR: 2.5

## 2011-09-15 ENCOUNTER — Telehealth: Payer: Self-pay | Admitting: *Deleted

## 2011-09-15 NOTE — Telephone Encounter (Signed)
Okay to take it every day---in fact that is what I generally recommend  The FDA makes them put that information on the package

## 2011-09-15 NOTE — Telephone Encounter (Signed)
Pt wants to know it if is ok to take Miralax powder every. She has started taking it already, but box says not to take for more than 7 days without approval of Dr.

## 2011-09-16 NOTE — Telephone Encounter (Signed)
Spoke with patient and advised results   

## 2011-09-23 ENCOUNTER — Encounter (HOSPITAL_COMMUNITY): Payer: Self-pay | Admitting: Pharmacy Technician

## 2011-09-23 ENCOUNTER — Encounter: Payer: Self-pay | Admitting: Internal Medicine

## 2011-09-23 ENCOUNTER — Ambulatory Visit (INDEPENDENT_AMBULATORY_CARE_PROVIDER_SITE_OTHER): Payer: Medicare Other | Admitting: Internal Medicine

## 2011-09-23 ENCOUNTER — Other Ambulatory Visit: Payer: Self-pay | Admitting: Internal Medicine

## 2011-09-23 ENCOUNTER — Encounter: Payer: Self-pay | Admitting: *Deleted

## 2011-09-23 DIAGNOSIS — R609 Edema, unspecified: Secondary | ICD-10-CM

## 2011-09-23 DIAGNOSIS — Z01810 Encounter for preprocedural cardiovascular examination: Secondary | ICD-10-CM

## 2011-09-23 DIAGNOSIS — I10 Essential (primary) hypertension: Secondary | ICD-10-CM

## 2011-09-23 DIAGNOSIS — I4891 Unspecified atrial fibrillation: Secondary | ICD-10-CM

## 2011-09-23 DIAGNOSIS — Z950 Presence of cardiac pacemaker: Secondary | ICD-10-CM

## 2011-09-23 DIAGNOSIS — I442 Atrioventricular block, complete: Secondary | ICD-10-CM

## 2011-09-23 LAB — PACEMAKER DEVICE OBSERVATION
BATTERY VOLTAGE: 2.52 V
BRDY-0002RV: 70 {beats}/min
BRDY-0004RV: 110 {beats}/min

## 2011-09-23 NOTE — Assessment & Plan Note (Signed)
Her device has reached ERI. We have discussed the risks of generator replacement including but not limited to infection. She understands these risks and is willing to proceed.

## 2011-09-23 NOTE — Assessment & Plan Note (Signed)
Well- controlled on current medication 

## 2011-09-23 NOTE — Assessment & Plan Note (Signed)
Permanent on warfarin which she is tolerating

## 2011-09-23 NOTE — Progress Notes (Signed)
HPI  Carrie [name] is a 78 y.o. female Seen in follow up for afib, chronic, S/p AV ablation and pacing. She has stable chronic shortness of breath.  Her device has reached ERI. She was implanted and followed by Dr. Juanda Chance. This would be her third procedure   Past Medical History  Diagnosis Date  . Atrial fibrillation   . Diastolic heart failure     mild volume overload and increased symptoms of shortness of breath  . DJD (degenerative joint disease) of lumbar spine   . Degenerative joint disease   . HTN (hypertension)   . History of recurrent UTIs     Dr. Artis Flock   . Sleep disturbance   . Neuropathy   . Gout   . Depression     Past Surgical History  Procedure Date  . Ventricular ablation surgery     atrioventricular nodal ablation  . Ventricular cardiac pacemaker insertion     St. Jude Victory  Pacemaker programmed to ventricular demand pacing  . Knee surgery     right  . Appendectomy   . Breast biopsy   . Pulse generator implant     placement of internal pulse generator for permanent spinal cord- stimulartor left hip    Current Outpatient Prescriptions  Medication Sig Dispense Refill  . allopurinol (ZYLOPRIM) 100 MG tablet TAKE ONE OR TWO TABLETS DAILY  60 tablet  6  . benazepril (LOTENSIN) 20 MG tablet Take 1 tablet (20 mg total) by mouth daily.  30 tablet  6  . cholecalciferol (VITAMIN D) 1000 UNITS tablet Take 1,000 Units by mouth daily.        . clonazePAM (KLONOPIN) 0.5 MG tablet Take 0.5-1 tablets (0.25-0.5 mg total) by mouth at bedtime as needed for anxiety.  30 tablet  1  . COLCRYS 0.6 MG tablet TAKE 1 TABLET TWICE A DAY AS NEEDED     FOR GOUT PAIN  60 tablet  3  . furosemide (LASIX) 80 MG tablet Take 1 tablet (80 mg total) by mouth daily. Hold if your weight at home is under 165#  30 tablet  11  . Lactulose SOLN 15-30 cc's twice a day as needed for constipation.  1 Bottle  3  . metoprolol (LOPRESSOR) 25 MG tablet Take 25 mg by mouth daily.        .  mirtazapine (REMERON) 15 MG tablet Take 1 tablet (15 mg total) by mouth at bedtime.  30 tablet  5  . nitroGLYCERIN (NITROSTAT) 0.4 MG SL tablet Place 0.4 mg under the tongue. Place 1 tablet under tongue as directed         . warfarin (COUMADIN) 4 MG tablet TAKE 1 TABLET EVERY DAY OR AS DIRECTED  BY -PHYSICIAN  30 tablet  3    Allergies  Allergen Reactions  . Gabapentin     Felt terrible, tremors, etc  . Meperidine Hcl     REACTION: unspecified  . Sulfamethoxazole     REACTION: unspecified  . Tramadol Hcl     REACTION: nausea    Review of Systems negative except from HPI and PMH  Physical Exam BP 118/74  Pulse 63  Ht 5\' 4"  (1.626 m)  Wt 170 lb 4 oz (77.225 kg)  BMI 29.22 kg/m2 Well developed and well nourished in no acute distress HENT normal E scleral and icterus clear Neck Supple JVP 7-8; carotids brisk and full Clear to ausculation regular rate and rhythm, no murmurs gallops or rub Soft with active bowel  sounds No clubbing cyanosis Trace Edema Alert and oriented, grossly normal motor and sensory function Skin Warm and Dry with venous insufficiency changes   Assessment and  Plan

## 2011-09-23 NOTE — Assessment & Plan Note (Signed)
Chronic, continue current medications

## 2011-09-24 LAB — BASIC METABOLIC PANEL
BUN/Creatinine Ratio: 27 — ABNORMAL HIGH (ref 11–26)
Calcium: 9.2 mg/dL (ref 8.6–10.2)
Creatinine, Ser: 1.14 mg/dL — ABNORMAL HIGH (ref 0.57–1.00)
GFR calc Af Amer: 49 mL/min/{1.73_m2} — ABNORMAL LOW (ref >59)
GFR calc non Af Amer: 43 mL/min/{1.73_m2} — ABNORMAL LOW (ref >59)
Potassium: 4.3 mmol/L (ref 3.5–5.2)
Sodium: 142 mmol/L (ref 134–144)

## 2011-09-24 LAB — CBC WITH DIFFERENTIAL
Basos: 0 % (ref 0–3)
Eos: 3 % (ref 0–7)
Eosinophils Absolute: 0.2 10*3/uL (ref 0.0–0.4)
Hemoglobin: 13.5 g/dL (ref 11.1–15.9)
Lymphocytes Absolute: 1.1 10*3/uL (ref 0.7–4.5)
MCH: 32.1 pg (ref 26.6–33.0)
MCHC: 34.4 g/dL (ref 31.5–35.7)
MCV: 94 fL (ref 79–97)
Monocytes Absolute: 0.3 10*3/uL (ref 0.1–1.0)
Neutrophils Absolute: 3.3 10*3/uL (ref 1.8–7.8)
RBC: 4.2 x10E6/uL (ref 3.77–5.28)

## 2011-09-28 MED ORDER — SODIUM CHLORIDE 0.9 % IR SOLN
80.0000 mg | Status: AC
  Filled 2011-09-28: qty 2

## 2011-09-28 MED ORDER — CEFAZOLIN SODIUM 1-5 GM-% IV SOLN: 1.0000 g | INTRAVENOUS | Status: AC

## 2011-09-29 ENCOUNTER — Encounter (HOSPITAL_COMMUNITY)
Admission: RE | Disposition: A | Discharge status: Home / Self Care | Payer: Self-pay | Source: Ambulatory Visit | Attending: Internal Medicine

## 2011-09-29 ENCOUNTER — Ambulatory Visit (HOSPITAL_COMMUNITY)
Admission: RE | Admit: 2011-09-29 | Discharge: 2011-09-29 | Disposition: A | Discharge status: Home / Self Care | Payer: Medicare Other | Source: Ambulatory Visit | Attending: Internal Medicine | Admitting: Internal Medicine

## 2011-09-29 DIAGNOSIS — I442 Atrioventricular block, complete: Secondary | ICD-10-CM

## 2011-09-29 DIAGNOSIS — F329 Major depressive disorder, single episode, unspecified: Secondary | ICD-10-CM | POA: Insufficient documentation

## 2011-09-29 DIAGNOSIS — G589 Mononeuropathy, unspecified: Secondary | ICD-10-CM | POA: Insufficient documentation

## 2011-09-29 DIAGNOSIS — Z87440 Personal history of urinary (tract) infections: Secondary | ICD-10-CM | POA: Insufficient documentation

## 2011-09-29 DIAGNOSIS — F3289 Other specified depressive episodes: Secondary | ICD-10-CM | POA: Insufficient documentation

## 2011-09-29 DIAGNOSIS — G479 Sleep disorder, unspecified: Secondary | ICD-10-CM | POA: Insufficient documentation

## 2011-09-29 DIAGNOSIS — Z01810 Encounter for preprocedural cardiovascular examination: Secondary | ICD-10-CM

## 2011-09-29 DIAGNOSIS — Z950 Presence of cardiac pacemaker: Secondary | ICD-10-CM

## 2011-09-29 DIAGNOSIS — M109 Gout, unspecified: Secondary | ICD-10-CM | POA: Insufficient documentation

## 2011-09-29 DIAGNOSIS — Z45018 Encounter for adjustment and management of other part of cardiac pacemaker: Secondary | ICD-10-CM | POA: Insufficient documentation

## 2011-09-29 DIAGNOSIS — R0602 Shortness of breath: Secondary | ICD-10-CM | POA: Insufficient documentation

## 2011-09-29 DIAGNOSIS — M47817 Spondylosis without myelopathy or radiculopathy, lumbosacral region: Secondary | ICD-10-CM | POA: Insufficient documentation

## 2011-09-29 DIAGNOSIS — I4891 Unspecified atrial fibrillation: Secondary | ICD-10-CM

## 2011-09-29 DIAGNOSIS — R609 Edema, unspecified: Secondary | ICD-10-CM

## 2011-09-29 DIAGNOSIS — I10 Essential (primary) hypertension: Secondary | ICD-10-CM | POA: Insufficient documentation

## 2011-09-29 DIAGNOSIS — M1990 Unspecified osteoarthritis, unspecified site: Secondary | ICD-10-CM | POA: Insufficient documentation

## 2011-09-29 DIAGNOSIS — I5030 Unspecified diastolic (congestive) heart failure: Secondary | ICD-10-CM | POA: Insufficient documentation

## 2011-09-29 HISTORY — PX: PERMANENT PACEMAKER GENERATOR CHANGE: SHX6022

## 2011-09-29 LAB — PROTIME-INR: INR: 1.2 (ref 0.00–1.49)

## 2011-09-29 SURGERY — PERMANENT PACEMAKER GENERATOR CHANGE
Anesthesia: LOCAL

## 2011-09-29 MED ORDER — ACETAMINOPHEN 325 MG PO TABS: 325.0000 mg | ORAL_TABLET | ORAL | Status: DC | PRN

## 2011-09-29 MED ORDER — MUPIROCIN 2 % EX OINT
TOPICAL_OINTMENT | CUTANEOUS | Status: AC
  Administered 2011-09-29: 1
  Filled 2011-09-29: qty 22

## 2011-09-29 MED ORDER — MUPIROCIN 2 % EX OINT
TOPICAL_OINTMENT | Freq: Two times a day (BID) | CUTANEOUS | Status: DC
  Filled 2011-09-29: qty 22

## 2011-09-29 MED ORDER — LIDOCAINE HCL (PF) 1 % IJ SOLN
INTRAMUSCULAR | Status: AC
  Filled 2011-09-29: qty 60

## 2011-09-29 MED ORDER — SODIUM CHLORIDE 0.9 % IV SOLN: INTRAVENOUS | Status: AC

## 2011-09-29 MED ORDER — CHLORHEXIDINE GLUCONATE 4 % EX LIQD
60.0000 mL | Freq: Once | CUTANEOUS | Status: DC
  Filled 2011-09-29: qty 60

## 2011-09-29 MED ORDER — MIDAZOLAM HCL 5 MG/5ML IJ SOLN
INTRAMUSCULAR | Status: AC
  Filled 2011-09-29: qty 5

## 2011-09-29 MED ORDER — ONDANSETRON HCL 4 MG/2ML IJ SOLN: 4.0000 mg | Freq: Four times a day (QID) | INTRAMUSCULAR | Status: DC | PRN

## 2011-09-29 MED ORDER — CEFAZOLIN SODIUM 1-5 GM-% IV SOLN: 1.0000 g | Freq: Once | INTRAVENOUS | Status: DC

## 2011-09-29 MED ORDER — SODIUM CHLORIDE 0.9 % IV SOLN
INTRAVENOUS | Status: DC
  Administered 2011-09-29: 08:00:00 via INTRAVENOUS

## 2011-09-29 NOTE — Interval H&P Note (Signed)
History and Physical Interval Note:  09/29/2011 7:59 AM  Carrie [name]  has presented today for surgery, with the diagnosis of eri  The various methods of treatment have been discussed with the patient and family. After consideration of risks, benefits and other options for treatment, the patient has consented to  Procedure(s): PERMANENT PACEMAKER GENERATOR CHANGE as a surgical intervention .  The patients' history has been reviewed, patient examined, no change in status, stable for surgery.  I have reviewed the patients' chart and labs.  Questions were answered to the patient's satisfaction.     Sherryl Manges

## 2011-09-29 NOTE — H&P (View-Only) (Signed)
HPI  Carrie [name] is a 78 y.o. female Seen in follow up for afib, chronic, S/p AV ablation and pacing. She has stable chronic shortness of breath.  Her device has reached ERI. She was implanted and followed by Dr. Brodie. This would be her third procedure   Past Medical History  Diagnosis Date  . Atrial fibrillation   . Diastolic heart failure     mild volume overload and increased symptoms of shortness of breath  . DJD (degenerative joint disease) of lumbar spine   . Degenerative joint disease   . HTN (hypertension)   . History of recurrent UTIs     Dr. Wolfe   . Sleep disturbance   . Neuropathy   . Gout   . Depression     Past Surgical History  Procedure Date  . Ventricular ablation surgery     atrioventricular nodal ablation  . Ventricular cardiac pacemaker insertion     St. Jude Victory  Pacemaker programmed to ventricular demand pacing  . Knee surgery     right  . Appendectomy   . Breast biopsy   . Pulse generator implant     placement of internal pulse generator for permanent spinal cord- stimulartor left hip    Current Outpatient Prescriptions  Medication Sig Dispense Refill  . allopurinol (ZYLOPRIM) 100 MG tablet TAKE ONE OR TWO TABLETS DAILY  60 tablet  6  . benazepril (LOTENSIN) 20 MG tablet Take 1 tablet (20 mg total) by mouth daily.  30 tablet  6  . cholecalciferol (VITAMIN D) 1000 UNITS tablet Take 1,000 Units by mouth daily.        . clonazePAM (KLONOPIN) 0.5 MG tablet Take 0.5-1 tablets (0.25-0.5 mg total) by mouth at bedtime as needed for anxiety.  30 tablet  1  . COLCRYS 0.6 MG tablet TAKE 1 TABLET TWICE A DAY AS NEEDED     FOR GOUT PAIN  60 tablet  3  . furosemide (LASIX) 80 MG tablet Take 1 tablet (80 mg total) by mouth daily. Hold if your weight at home is under 165#  30 tablet  11  . Lactulose SOLN 15-30 cc's twice a day as needed for constipation.  1 Bottle  3  . metoprolol (LOPRESSOR) 25 MG tablet Take 25 mg by mouth daily.        .  mirtazapine (REMERON) 15 MG tablet Take 1 tablet (15 mg total) by mouth at bedtime.  30 tablet  5  . nitroGLYCERIN (NITROSTAT) 0.4 MG SL tablet Place 0.4 mg under the tongue. Place 1 tablet under tongue as directed         . warfarin (COUMADIN) 4 MG tablet TAKE 1 TABLET EVERY DAY OR AS DIRECTED  BY -PHYSICIAN  30 tablet  3    Allergies  Allergen Reactions  . Gabapentin     Felt terrible, tremors, etc  . Meperidine Hcl     REACTION: unspecified  . Sulfamethoxazole     REACTION: unspecified  . Tramadol Hcl     REACTION: nausea    Review of Systems negative except from HPI and PMH  Physical Exam BP 118/74  Pulse 63  Ht 5' 4" (1.626 m)  Wt 170 lb 4 oz (77.225 kg)  BMI 29.22 kg/m2 Well developed and well nourished in no acute distress HENT normal E scleral and icterus clear Neck Supple JVP 7-8; carotids brisk and full Clear to ausculation regular rate and rhythm, no murmurs gallops or rub Soft with active bowel   sounds No clubbing cyanosis Trace Edema Alert and oriented, grossly normal motor and sensory function Skin Warm and Dry with venous insufficiency changes   Assessment and  Plan  

## 2011-09-29 NOTE — Op Note (Signed)
Preoperative diagnosis complete heart ablock  Pacer at eri  Postoperative diagnosis same/  Procedure: Generator replacement    Following informed consent the patient was brought to the electrophysiology laboratory in place of the fluoroscopic table in the supine position after routine prep and drape lidocaine was infiltrated in the region of the previous incision and carried down to later the device pocket using sharp dissection and electrocautery. The pocket was opened the device was freed up and was explanted.  Interrogation of the previously implanted ventricular lead st jude [id number]   demonstrated an R wave of no intrinsic millivolts., and impedance of 679ohms, and a pacing threshold of 0.7 volts at 0.5 msec.     The leads were inspected. The lead was then attached to a [employer] Jude pulse generator, serial number [id number].    The pocket was irrigated with antibiotic containing saline solution hemostasis was assured and the leads and the device were placed in the pocket. The wound is then closed in 3 layers in normal fashion.  The patient tolerated the procedure without apparent complication.  Sherryl Manges

## 2011-09-29 NOTE — Assessment & Plan Note (Signed)
Pt device dependent s/p av nodal ablation  Her pacemaker is at eri and she needs to undergo generator replacement   We have discussed potential benefits and risks incl infection and bleeding  She understandes agrees and iswilling to proceed

## 2011-09-30 ENCOUNTER — Other Ambulatory Visit: Payer: Self-pay | Admitting: Internal Medicine

## 2011-09-30 ENCOUNTER — Other Ambulatory Visit: Payer: Self-pay | Admitting: Cardiovascular Disease

## 2011-09-30 NOTE — Telephone Encounter (Signed)
Refill sent for lisinopril  

## 2011-10-06 ENCOUNTER — Ambulatory Visit (INDEPENDENT_AMBULATORY_CARE_PROVIDER_SITE_OTHER): Payer: Medicare Other | Admitting: Emergency Medicine

## 2011-10-06 ENCOUNTER — Encounter: Payer: Self-pay | Admitting: Cardiovascular Disease

## 2011-10-06 ENCOUNTER — Ambulatory Visit (INDEPENDENT_AMBULATORY_CARE_PROVIDER_SITE_OTHER): Payer: Medicare Other | Admitting: Cardiovascular Disease

## 2011-10-06 DIAGNOSIS — I4891 Unspecified atrial fibrillation: Secondary | ICD-10-CM

## 2011-10-06 DIAGNOSIS — R609 Edema, unspecified: Secondary | ICD-10-CM

## 2011-10-06 DIAGNOSIS — I5032 Chronic diastolic (congestive) heart failure: Secondary | ICD-10-CM

## 2011-10-06 DIAGNOSIS — Z7901 Long term (current) use of anticoagulants: Secondary | ICD-10-CM

## 2011-10-06 DIAGNOSIS — I509 Heart failure, unspecified: Secondary | ICD-10-CM

## 2011-10-06 DIAGNOSIS — R0602 Shortness of breath: Secondary | ICD-10-CM

## 2011-10-06 LAB — POCT INR: INR: 1.4

## 2011-10-06 MED ORDER — METOLAZONE 2.5 MG PO TABS: 2.5000 mg | ORAL_TABLET | Freq: Every day | ORAL | Status: DC | PRN

## 2011-10-06 NOTE — Progress Notes (Signed)
Patient ID: Carrie [name], female    DOB: 05-14-1920, 78 y.o.   MRN: [id number]  HPI Comments: 78 year old white female patient with a h/o diastolic heart failure,  pneumonia 01/2010, severe pulmonary hypertension on recent echo,  long history of back problems requiring surgery,  chronic atrial fibrillation with AV node ablation, placement of pacemaker, who presents for followup. Previous clinic visit, he started aggressive diuresis for significant edema and weight gain. She presents today for followup.  On her last clinic visit, she had a 15 pound weight loss using Lasix. Since we have last seen her, she has been taking Lasix 80 mg daily and takes metolazone 2.5 mg rarely, approximately every other week for shortness of breath or edema. She does better with metolazone and Lasix then she does with Lasix b.i.d.. She feels her weight has been stable in the 165 range. When her weight goes to 168, she takes metolazone.   Pacemaker change out last week by Dr. Graciela Husbands with no problems   EKG on January 15 showingventricular paced rhythm, rate 88 beats per minute    Outpatient Encounter Prescriptions as of 10/06/2011  Medication Sig Dispense Refill  . allopurinol (ZYLOPRIM) 100 MG tablet Take 200 mg by mouth daily.       . benazepril (LOTENSIN) 20 MG tablet TAKE 1 TABLET EVERY DAY  30 tablet  6  . cholecalciferol (VITAMIN D) 1000 UNITS tablet Take 1,000 Units by mouth daily.        . clonazePAM (KLONOPIN) 0.5 MG tablet Take 0.25-0.5 mg by mouth at bedtime as needed.      . colchicine (COLCRYS) 0.6 MG tablet Take 0.6 mg by mouth daily.       . furosemide (LASIX) 80 MG tablet Take 80 mg by mouth daily. Hold if your weight at home is under 165#       . metoprolol (LOPRESSOR) 25 MG tablet Take 25 mg by mouth daily.        . mirtazapine (REMERON) 15 MG tablet TAKE ONE TABLET AT BEDTIME  30 tablet  11  . polyethylene glycol powder (GLYCOLAX/MIRALAX) powder Take 17 g by mouth daily.      Marland Kitchen warfarin  (COUMADIN) 4 MG tablet Take 2 mg by mouth daily.      . metolazone (ZAROXOLYN) 2.5 MG tablet Take 1 tablet (2.5 mg total) by mouth daily as needed.  30 tablet  6     Review of Systems  Constitutional: Negative.   HENT: Negative.   Eyes: Negative.   Respiratory: Negative.   Cardiovascular: Negative.   Gastrointestinal: Negative.   Musculoskeletal: Positive for back pain, joint swelling and arthralgias.  Skin: Negative.   Neurological: Negative.   Hematological: Negative.   Psychiatric/Behavioral: Positive for sleep disturbance and dysphoric mood.  All other systems reviewed and are negative.    BP 129/77  Pulse 70  Ht 5\' 4"  (1.626 m)  Wt 170 lb 1.9 oz (77.166 kg)  BMI 29.20 kg/m2  Physical Exam  Nursing note and vitals reviewed. Constitutional: She is oriented to person, place, and time. She appears well-developed and well-nourished.  HENT:  Head: Normocephalic.  Nose: Nose normal.  Mouth/Throat: Oropharynx is clear and moist.  Eyes: Conjunctivae are normal. Pupils are equal, round, and reactive to light.  Neck: Normal range of motion. Neck supple. No JVD present.  Cardiovascular: Normal rate, regular rhythm, S1 normal, S2 normal and intact distal pulses.  Exam reveals no gallop and no friction rub.   Murmur heard.  Crescendo systolic murmur is present with a grade of 2/6  Pulmonary/Chest: Effort normal and breath sounds normal. No respiratory distress. She has no wheezes. She has no rales. She exhibits no tenderness.  Abdominal: Soft. Bowel sounds are normal. She exhibits no distension. There is no tenderness.  Musculoskeletal: Normal range of motion. She exhibits no edema and no tenderness.  Lymphadenopathy:    She has no cervical adenopathy.  Neurological: She is alert and oriented to person, place, and time. Coordination normal.  Skin: Skin is warm and dry. No rash noted. No erythema.  Psychiatric: She has a normal mood and affect. Her behavior is normal. Judgment and  thought content normal.         Assessment and Plan

## 2011-10-06 NOTE — Assessment & Plan Note (Signed)
Chronic S/P AV nodal ablation. Recent pacemaker change out, doing well. Followup with Dr. Graciela Husbands next week

## 2011-10-06 NOTE — Patient Instructions (Signed)
You are doing well. No medication changes were made.  Please call us if you have new issues that need to be addressed before your next appt.  Your physician wants you to follow-up in: 6 months.  You will receive a reminder letter in the mail two months in advance. If you don't receive a letter, please call our office to schedule the follow-up appointment.   

## 2011-10-06 NOTE — Assessment & Plan Note (Signed)
Edema has resolved on her current diuretic regimen. Weight is stable. No medication changes made.

## 2011-10-06 NOTE — Assessment & Plan Note (Signed)
No significant shortness of breath appeared she takes extra metolazone for symptoms prn

## 2011-10-06 NOTE — Assessment & Plan Note (Signed)
She has been relatively stable with Lasix daily and occasional p.r.n. Metolazone. No medication changes made and we have suggested she stay on what she is on. Goal weight 165 or less.

## 2011-10-08 ENCOUNTER — Encounter: Payer: Medicare Other | Admitting: Emergency Medicine

## 2011-10-08 NOTE — Progress Notes (Signed)
Addended by: Phoebe Sharps on: 10/08/2011 07:39 PM   Modules accepted: Level of Service

## 2011-10-15 ENCOUNTER — Encounter: Payer: Self-pay | Admitting: Internal Medicine

## 2011-10-15 ENCOUNTER — Ambulatory Visit (INDEPENDENT_AMBULATORY_CARE_PROVIDER_SITE_OTHER): Payer: Medicare Other | Admitting: *Deleted

## 2011-10-15 DIAGNOSIS — I442 Atrioventricular block, complete: Secondary | ICD-10-CM

## 2011-10-15 LAB — PACEMAKER DEVICE OBSERVATION
BATTERY VOLTAGE: 3.023 V
BMOD-0002RV: 8
BRDY-0002RV: 70 {beats}/min
BRDY-0004RV: 120 {beats}/min
DEVICE MODEL PM: 7280550
RV LEAD IMPEDENCE PM: 737.5 Ohm
RV LEAD THRESHOLD: 1 V
VENTRICULAR PACING PM: 100

## 2011-10-15 NOTE — Progress Notes (Signed)
Wound check-PPM 

## 2011-10-16 ENCOUNTER — Other Ambulatory Visit: Payer: Self-pay | Admitting: Internal Medicine

## 2011-10-16 NOTE — Telephone Encounter (Signed)
rx called into pharmacy

## 2011-10-16 NOTE — Telephone Encounter (Signed)
Okay #30 x 0 

## 2011-11-05 ENCOUNTER — Ambulatory Visit (INDEPENDENT_AMBULATORY_CARE_PROVIDER_SITE_OTHER): Payer: Medicare Other | Admitting: *Deleted

## 2011-11-05 DIAGNOSIS — I4891 Unspecified atrial fibrillation: Secondary | ICD-10-CM

## 2011-11-05 DIAGNOSIS — Z7901 Long term (current) use of anticoagulants: Secondary | ICD-10-CM

## 2011-11-05 IMAGING — CT CT ABD-PELV W/O CM
1 of 2 series · 15 of 32 positions shown, 19 images · non-contrast
Comparison: None

REASON FOR EXAM: (1) abdominal pain fall on coumadin; (2) abdominal pain
fall on coumadin
COMMENTS:

PROCEDURE:     CT  - CT ABDOMEN AND PELVIS W[DATE]  [DATE]
RESULT:     Indication: A abdominal pain
TECHNIQUE: Multiple axial images from the lung bases to the symphysis pubis
were obtained without oral and without intravenous contrast.

[Series 2: 3mm soft tissue · axial · 0.74mm/px · z∈[-746,-335]mm · 15 of 149 slices shown, 19 images]
[im 6/149  soft-tissue]
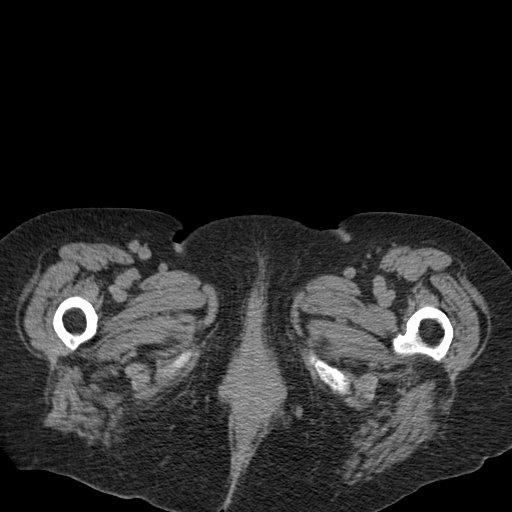
[im 6/149  bone]
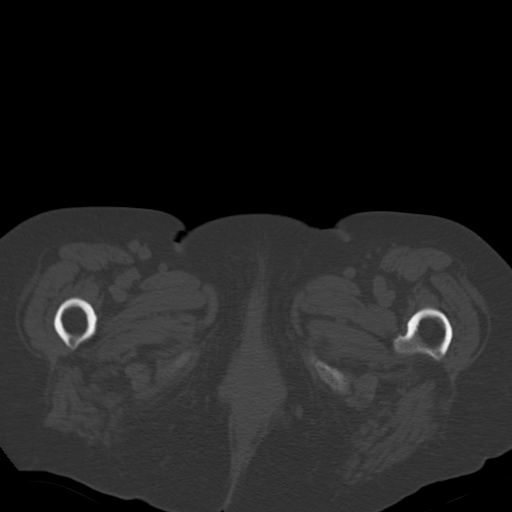
[im 18/149  soft-tissue]
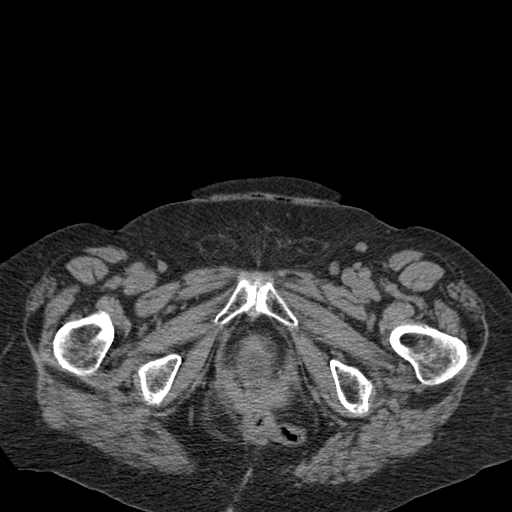
[im 30/149  soft-tissue]
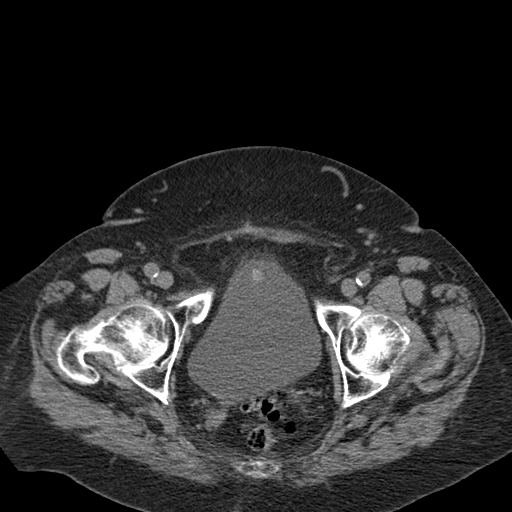
[im 42/149  soft-tissue]
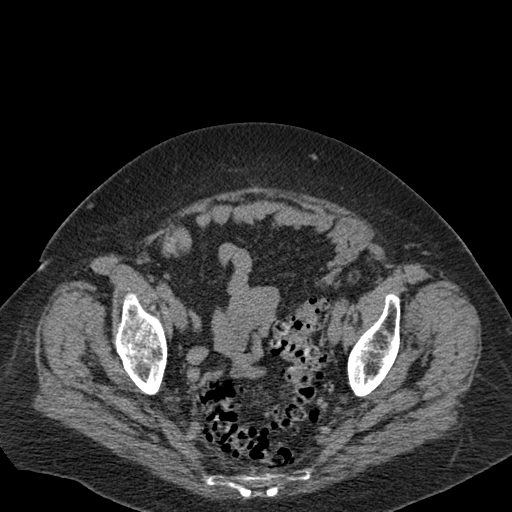
[im 54/149  soft-tissue]
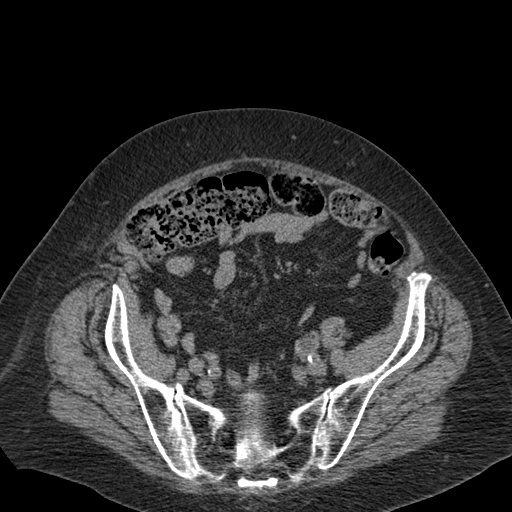
[im 66/149  soft-tissue]
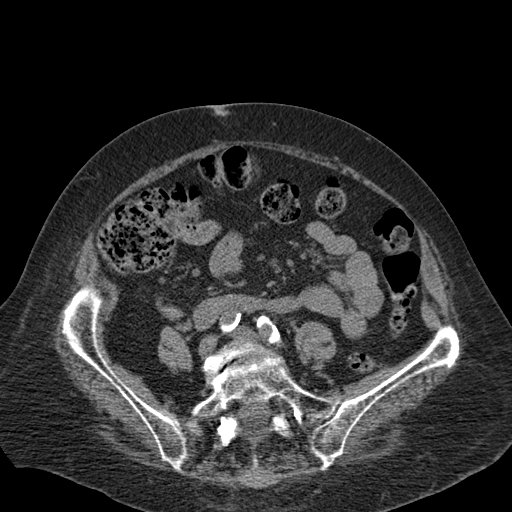
[im 77/149  soft-tissue]
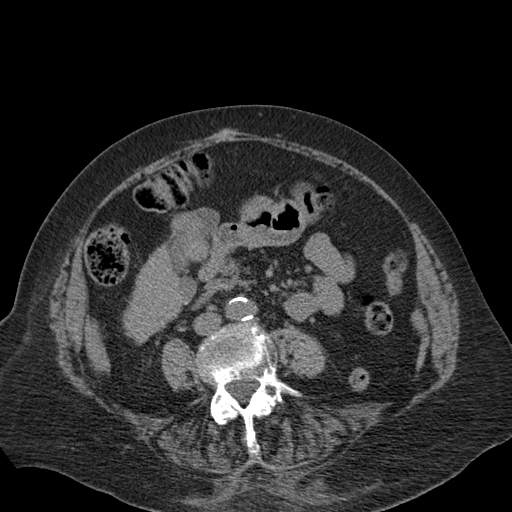
[im 83/149  soft-tissue]
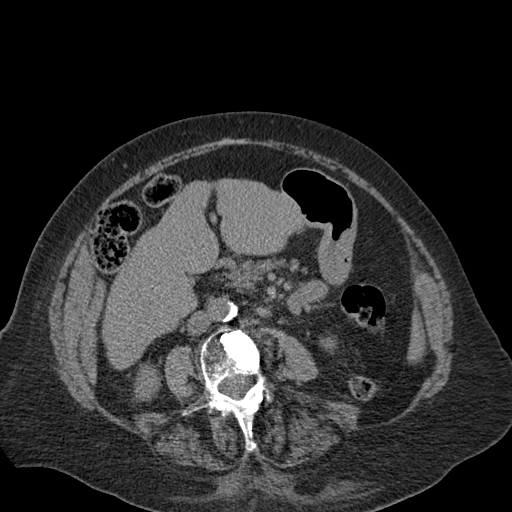
[im 95/149  soft-tissue]
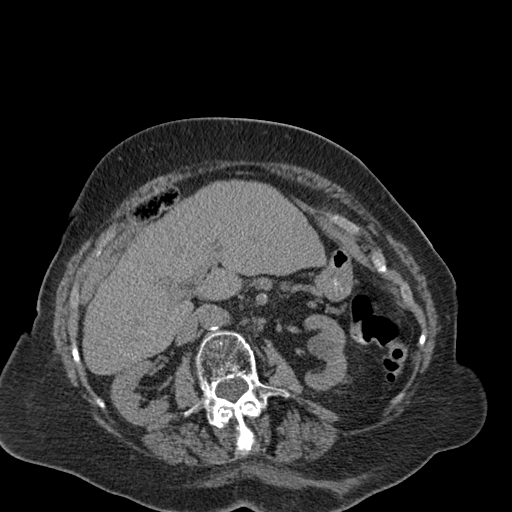
[im 95/149  bone]
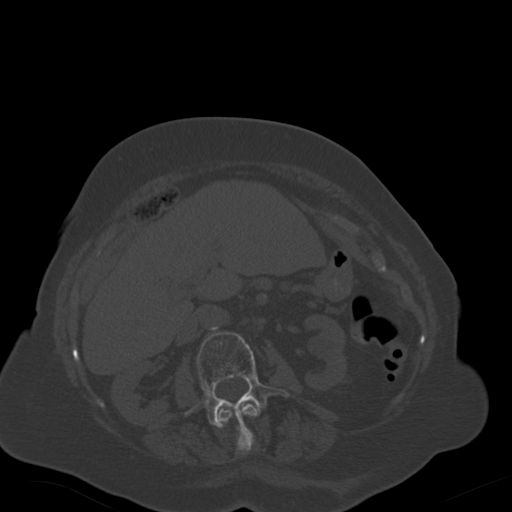
[im 107/149  soft-tissue]
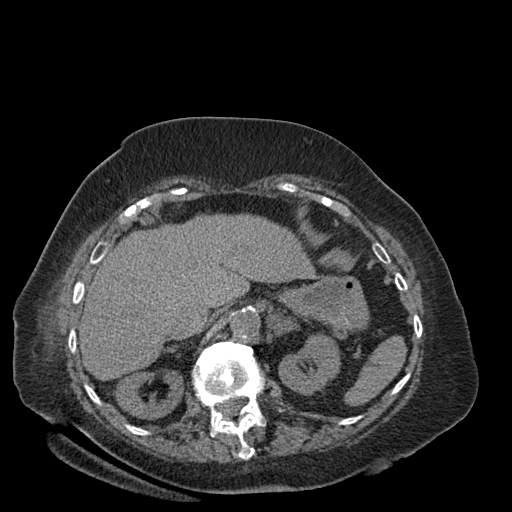
[im 119/149  soft-tissue]
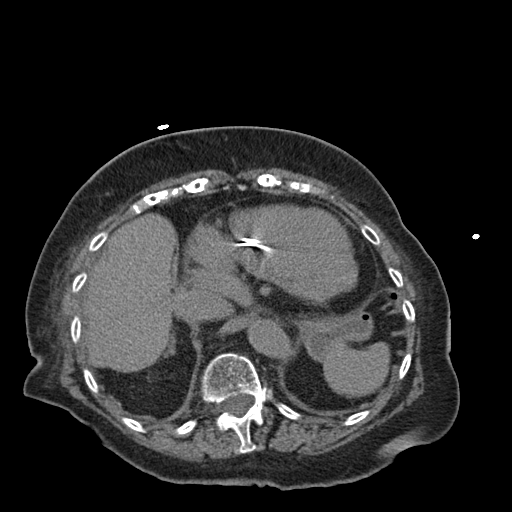
[im 125/149  lung]
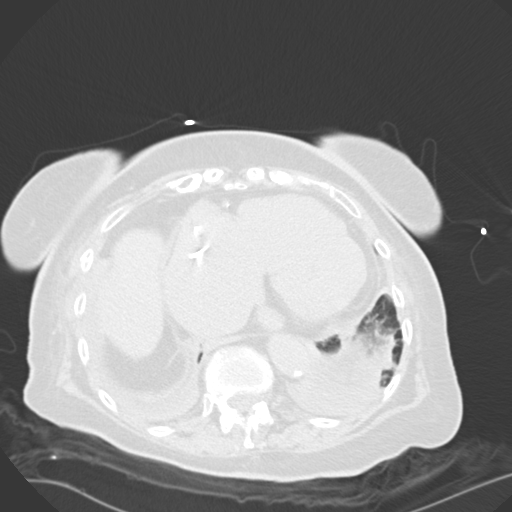
[im 131/149  soft-tissue]
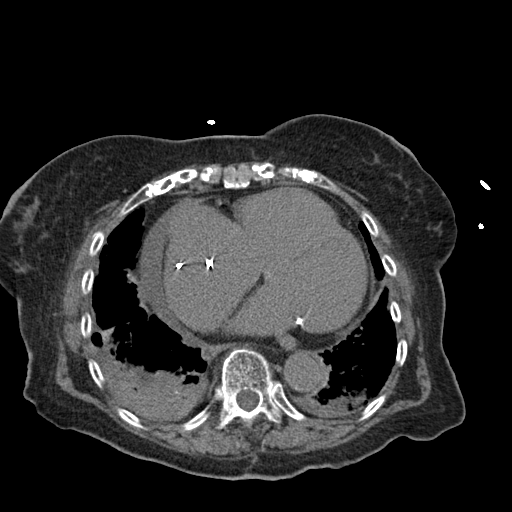
[im 131/149  lung]
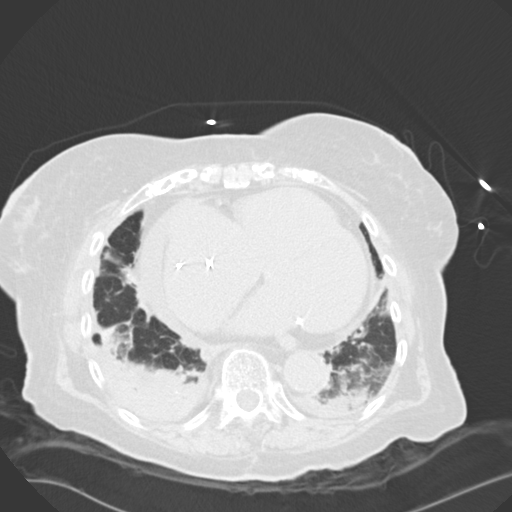
[im 137/149  lung]
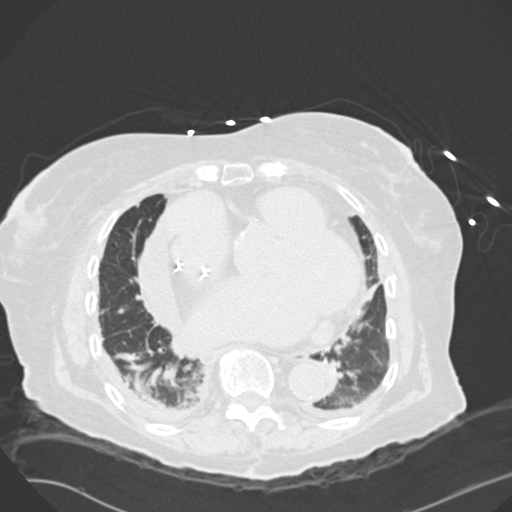
[im 143/149  soft-tissue]
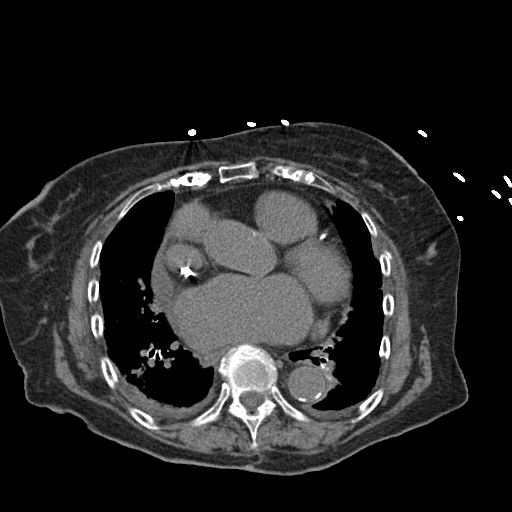
[im 143/149  lung]
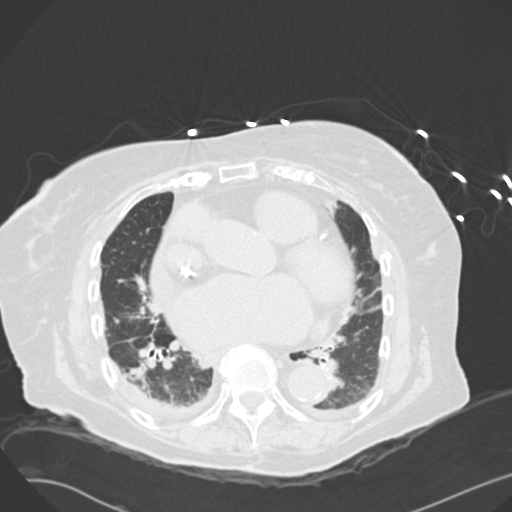

[15 of 32 positions shown; findings below may reference images not displayed]

FINDINGS: There is right lower lobe airspace disease. There is no pleural or
pericardial effusions.

No renal, ureteral, or bladder calculi. No obstructive uropathy. No
perinephric stranding is seen. The kidneys are symmetric in size without
evidence for exophytic mass. The bladder is unremarkable.

The liver demonstrates no focal abnormality. The gallbladder is
unremarkable. The spleen demonstrates no focal abnormality. The adrenal
glands and pancreas are normal.

The unopacified stomach, duodenum, small intestine, and large intestine are
unremarkable, but evaluation is limited by lack of oral contrast. There is
diverticulosis without evidence of diverticulitis. There is no
pneumoperitoneum, pneumatosis, or portal venous gas. There is no abdominal
or pelvic free fluid. There is no lymphadenopathy.

The abdominal aorta is normal in caliber with atherosclerosis.

The osseous structures are unremarkable. There is multilevel lumbar spine
kyphoplasty.
IMPRESSION: 1. No acute abdominal or pelvic pathology.

2. Right lower lobe airspace disease which may present atelectasis versus
infiltrate.

## 2011-11-05 IMAGING — CR DG CHEST 1V PORT
1 series · 1 of 1 positions shown · non-contrast
Comparison: none

REASON FOR EXAM: chest pain
COMMENTS:

PROCEDURE:     DXR - DXR PORTABLE CHEST SINGLE VIEW  - September 28, 2010  [DATE]
RESULT:     Comparison: 09/25/1916

[view not recorded]
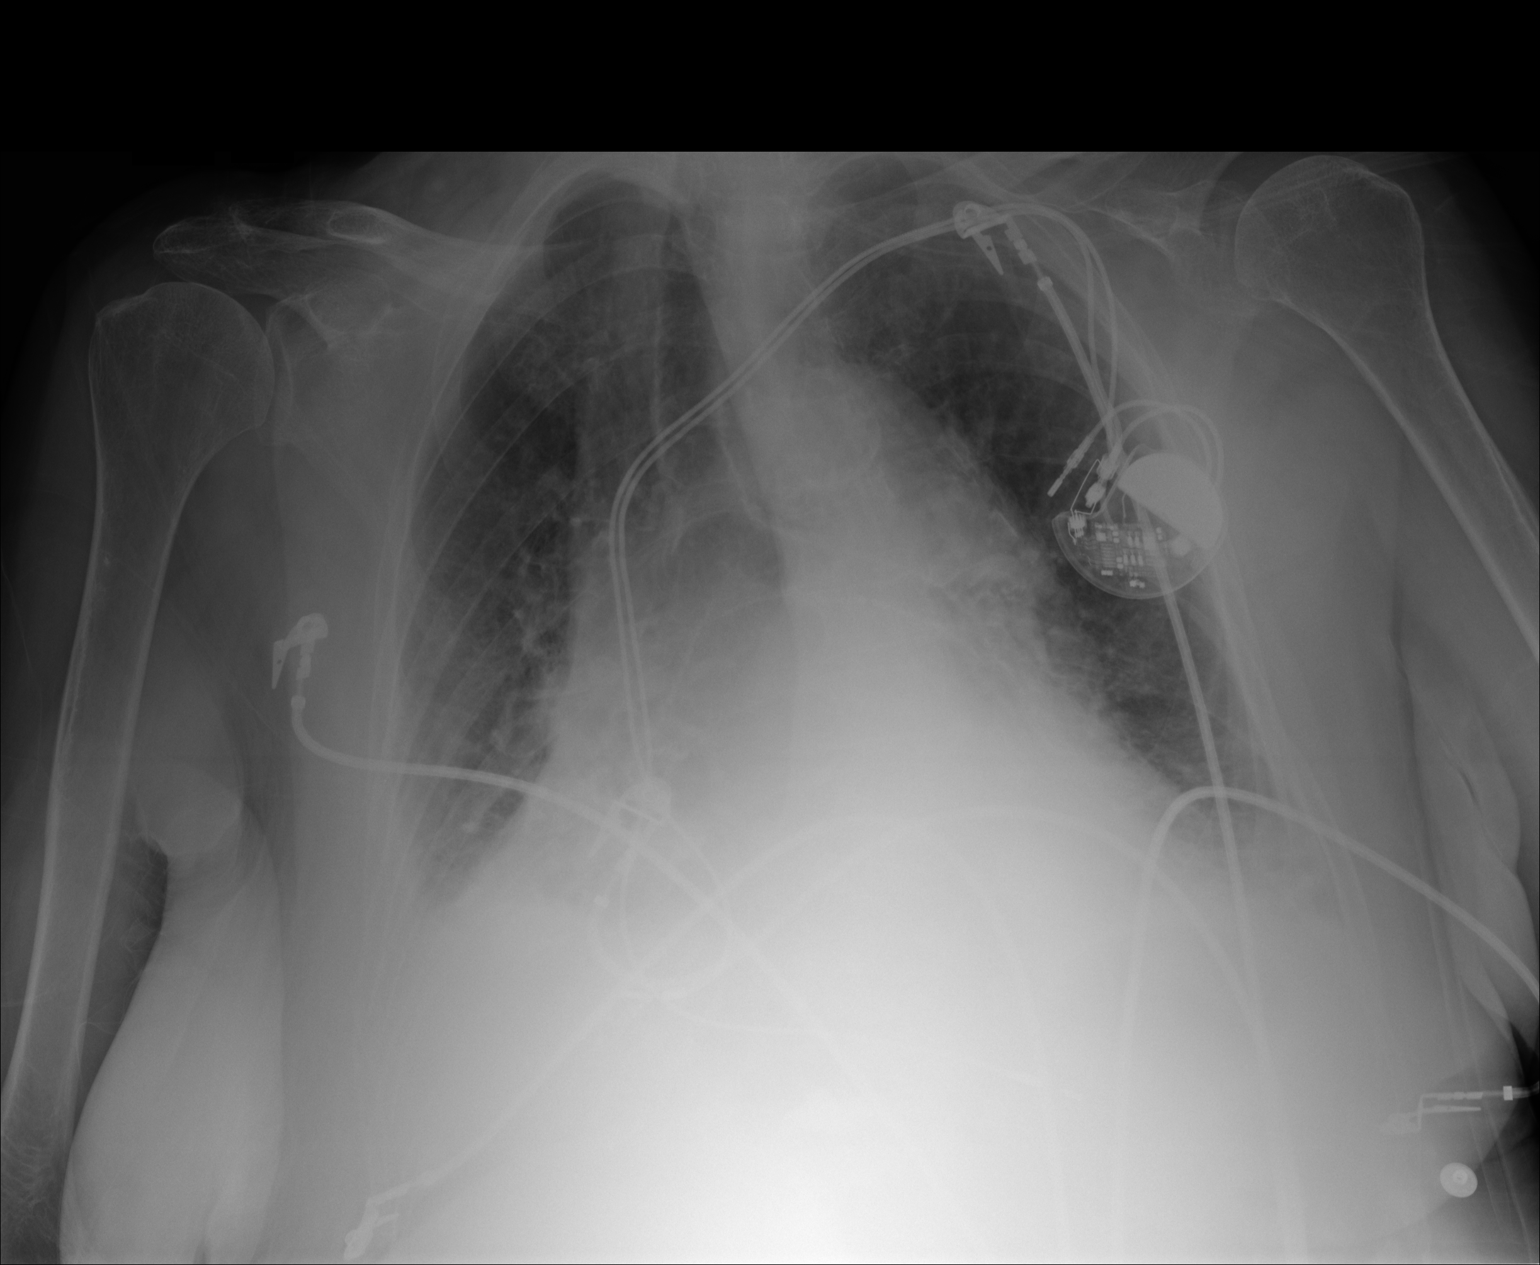

[1 of 1 positions shown; findings below may reference images not displayed]

FINDINGS: Single portable AP chest radiograph is provided. The patient is rotated
towards the right. There bilateral small pleural effusions. There is
bilateral diffuse interstitial thickening. There is no pneumothorax. The
heart size is enlarged. There is a dual-lead cardiac pacer noted. The
osseous structures are unremarkable.
IMPRESSION: Overall findings are concerning for mild pulmonary edema versus interstitial
pneumonitis secondary to an infectious or inflammatory etiology.

## 2011-11-12 ENCOUNTER — Telehealth: Payer: Self-pay | Admitting: *Deleted

## 2011-11-12 NOTE — Telephone Encounter (Signed)
Patient calling complaining of cough and green mucus, pt scheduled appt tomorrow at 1:45 with Dr.Letvak ( she didn't want to see any other provider) and was asking if its ok to take tussionex with all her other medications? She really concerned about there sleep meds if the tussionex will interfere with those?

## 2011-11-12 NOTE — Telephone Encounter (Signed)
It is okay to take the tussionex with the sleep med but she should be very careful if she gets up---like to use bathroom---after taking both together  Can try 1/2 teaspoon of the tussionex to see if that is enough

## 2011-11-12 NOTE — Telephone Encounter (Signed)
Spoke with patient and advised results, pt will keep OV appt 11/13/11

## 2011-11-12 NOTE — Telephone Encounter (Signed)
Triage Record Num: [id number] Operator: Di Kindle Patient Name: Carrie [name] Call Date & Time: 11/12/2011 12:26:52PM Patient Phone: [phone] PCP: Tillman Abide Patient Gender: Female PCP Fax : [phone] Patient DOB: [birth year] Practice Name: [facility] Reason for Call: Caller: Gisel/Patient; PCP: Tillman Abide I.; CB#: [phone]; Call regarding Cough/Congestion; onset 11/10/11 not sleeping, causes pain in chest, T 99. Guideline: Cough, productive green color, with HX and worsening cough. Disp: see within 4 hrs, no appt in EPIC, caller to office: Loistine Simas. who advises she will call pt back with recommendations. Protocol(s) Used: Cough - Adult Recommended Outcome per Protocol: See Provider within 4 hours Reason for Outcome: New or worsening cough AND known cardiac or respiratory condition Care Advice: ~ Another adult should drive. ~ Avoid any activity that produces symptoms until evaluated by provider. Call EMS 911 if sudden worsening of breathing problem, dusky or blue/gray color of lips, fingernail beds or skin; chest pain, weakness, or confusion occurs. ~ 11/12/2011 12:39:52PM Page 1 of 1 [email or web address]

## 2011-11-13 ENCOUNTER — Encounter: Payer: Self-pay | Admitting: Internal Medicine

## 2011-11-13 ENCOUNTER — Ambulatory Visit: Payer: Medicare Other

## 2011-11-13 ENCOUNTER — Ambulatory Visit (INDEPENDENT_AMBULATORY_CARE_PROVIDER_SITE_OTHER)
Admission: RE | Admit: 2011-11-13 | Discharge: 2011-11-13 | Disposition: A | Discharge status: Home / Self Care | Payer: Medicare Other | Source: Ambulatory Visit | Attending: Internal Medicine | Admitting: Internal Medicine

## 2011-11-13 ENCOUNTER — Telehealth: Payer: Self-pay | Admitting: *Deleted

## 2011-11-13 ENCOUNTER — Ambulatory Visit (INDEPENDENT_AMBULATORY_CARE_PROVIDER_SITE_OTHER): Payer: Medicare Other | Admitting: Internal Medicine

## 2011-11-13 VITALS — BP 120/68 | HR 84 | Temp 100.6°F | Resp 18 | Wt 168.0 lb

## 2011-11-13 DIAGNOSIS — R0602 Shortness of breath: Secondary | ICD-10-CM

## 2011-11-13 DIAGNOSIS — J209 Acute bronchitis, unspecified: Secondary | ICD-10-CM

## 2011-11-13 DIAGNOSIS — I509 Heart failure, unspecified: Secondary | ICD-10-CM

## 2011-11-13 DIAGNOSIS — I5032 Chronic diastolic (congestive) heart failure: Secondary | ICD-10-CM

## 2011-11-13 MED ORDER — LEVOFLOXACIN 500 MG PO TABS: 500.0000 mg | ORAL_TABLET | Freq: Every day | ORAL | Status: AC

## 2011-11-13 NOTE — Telephone Encounter (Signed)
Received a call from pharmacy stating that Levaquin has an interaction with Coumadin.  Pharmacist wants to make sure Dr. Alphonsus Sias is ok with patient being on this medication.

## 2011-11-13 NOTE — Assessment & Plan Note (Addendum)
Has productive cough Low grade fever SOB CXR just shows enlarged heart and ?pulm vascular congestion Will treat with levaquin in case some pneumonia---awaiting radiologist report  Radiologist confirms no pneumonia

## 2011-11-13 NOTE — Telephone Encounter (Signed)
Yes  I am having her come in for protime early next week to check

## 2011-11-13 NOTE — Telephone Encounter (Signed)
Pharmacist advised as instructed via telephone. 

## 2011-11-13 NOTE — Assessment & Plan Note (Signed)
Very large heart May have some pulmonary vascular congestion Weight has been stable Was fine until apparent infection

## 2011-11-13 NOTE — Progress Notes (Signed)
Subjective:    Patient ID: Carrie [name], female    DOB: 22-Jul-1920, 78 y.o.   MRN: [id number]  HPI Not feeling well for several days No sig nasal or head congestion All in chest Lots of cough---green, brown sputum Some SOB yesterday Fever 99 at home No sweats but feels chilled (nothing new for her)  No sore throat or ear pain Hasn't used any meds  Current Outpatient Prescriptions on File Prior to Visit  Medication Sig Dispense Refill  . allopurinol (ZYLOPRIM) 100 MG tablet Take 200 mg by mouth daily.       . benazepril (LOTENSIN) 20 MG tablet TAKE 1 TABLET EVERY DAY  30 tablet  6  . cholecalciferol (VITAMIN D) 1000 UNITS tablet Take 1,000 Units by mouth daily.        . clonazePAM (KLONOPIN) 0.5 MG tablet TAKE ONE-HALF TO ONE TABLET AT BEDTIME  AS NEEDED  30 tablet  0  . colchicine (COLCRYS) 0.6 MG tablet Take 0.6 mg by mouth daily.       . furosemide (LASIX) 80 MG tablet Take 80 mg by mouth daily. Hold if your weight at home is under 165#       . metolazone (ZAROXOLYN) 2.5 MG tablet Take 1 tablet (2.5 mg total) by mouth daily as needed.  30 tablet  6  . metoprolol (LOPRESSOR) 25 MG tablet Take 25 mg by mouth daily.        . mirtazapine (REMERON) 15 MG tablet TAKE ONE TABLET AT BEDTIME  30 tablet  11  . polyethylene glycol powder (GLYCOLAX/MIRALAX) powder Take 17 g by mouth daily.      Marland Kitchen warfarin (COUMADIN) 4 MG tablet Take 2 mg by mouth daily.        Allergies  Allergen Reactions  . Gabapentin Other (See Comments)    Felt terrible, tremors, etc  . Meperidine Hcl Nausea And Vomiting  . Sulfamethoxazole Swelling    REACTION: unspecified  . Tramadol Hcl Nausea Only    Past Medical History  Diagnosis Date  . Atrial fibrillation     Status post AV junction ablation  . Diastolic heart failure     mild volume overload and increased symptoms of shortness of breath  . DJD (degenerative joint disease) of lumbar spine   . Pacemaker     Original implant 1995, generator  replacement 2006  . HTN (hypertension)   . History of recurrent UTIs     Dr. Artis Flock   . Sleep disturbance   . Neuropathy   . Gout   . Depression     Past Surgical History  Procedure Date  . Ventricular ablation surgery     atrioventricular nodal ablation  . Knee surgery     right  . Appendectomy   . Breast biopsy   . Pulse generator implant     placement of internal pulse generator for permanent spinal cord- stimulartor left hip  . Ventricular cardiac pacemaker insertion     St. Jude Victory  Pacemaker programmed to ventricular demand pacing  . Insert / replace / remove pacemaker     generator change out 09/29/11    Family History  Problem Relation Age of Onset  . Heart disease Mother     heart failure  . Stroke Father     History   Social History  . Marital Status: Married    Spouse Name: N/A    Number of Children: 2  . Years of Education: N/A   Occupational History  .  homemaker    Social History Main Topics  . Smoking status: Former Smoker -- 1.0 packs/day    Types: Cigarettes  . Smokeless tobacco: Never Used   Comment: quit long ago   . Alcohol Use: 0.5 oz/week    1 drink(s) per week  . Drug Use: No  . Sexually Active: Not on file   Other Topics Concern  . Not on file   Social History Narrative   Son Rosanne Ashing holds health care power of attorney. Does request DNR. No tube feeds in cognitively unaware   Review of Systems Vomited yesterday--not sure if it was cough related No diarrhea Appetite is off     Objective:   Physical Exam  Constitutional: No distress.       Looks washed out  HENT:  Mouth/Throat: Oropharynx is clear and moist. No oropharyngeal exudate.       Bilateral maxillary tenderness Minimal nasal congestion TMs fine  Neck: Normal range of motion. Neck supple.  Pulmonary/Chest: Effort normal. No respiratory distress. She has no wheezes. She has rales. She exhibits no tenderness.       Has some decreased breath sounds and slight  crackles (very faint) at right base  Lymphadenopathy:    She has no cervical adenopathy.          Assessment & Plan:

## 2011-11-13 NOTE — Assessment & Plan Note (Signed)
Could have an element of CHF but not clear cut Probably just infectious problem at present

## 2011-11-13 NOTE — Patient Instructions (Signed)
Please set up for protime for next Monday or Tuesday to check while on levofloxacin

## 2011-11-17 ENCOUNTER — Inpatient Hospital Stay: Payer: Self-pay | Admitting: Internal Medicine

## 2011-11-17 ENCOUNTER — Telehealth: Payer: Self-pay | Admitting: Internal Medicine

## 2011-11-17 LAB — URINALYSIS, COMPLETE
Bilirubin,UR: NEGATIVE
Blood: NEGATIVE
Glucose,UR: NEGATIVE mg/dL (ref 0–75)
Leukocyte Esterase: NEGATIVE
Nitrite: NEGATIVE
Protein: NEGATIVE
Specific Gravity: 1.008 (ref 1.003–1.030)
Squamous Epithelial: 1

## 2011-11-17 LAB — COMPREHENSIVE METABOLIC PANEL
Albumin: 3.7 g/dL (ref 3.4–5.0)
Alkaline Phosphatase: 85 U/L (ref 50–136)
Anion Gap: 9 (ref 7–16)
BUN: 42 mg/dL — ABNORMAL HIGH (ref 7–18)
Calcium, Total: 8.7 mg/dL (ref 8.5–10.1)
Glucose: 109 mg/dL — ABNORMAL HIGH (ref 65–99)
SGOT(AST): 23 U/L (ref 15–37)
SGPT (ALT): 12 U/L
Total Protein: 7 g/dL (ref 6.4–8.2)

## 2011-11-17 LAB — CBC WITH DIFFERENTIAL/PLATELET
Basophil #: 0 10*3/uL (ref 0.0–0.1)
Eosinophil #: 0 10*3/uL (ref 0.0–0.7)
HCT: 39.5 % (ref 35.0–47.0)
Lymphocyte #: 0.6 10*3/uL — ABNORMAL LOW (ref 1.0–3.6)
Lymphocyte %: 14.8 %
MCHC: 32.9 g/dL (ref 32.0–36.0)
MCV: 95 fL (ref 80–100)
Monocyte #: 0.4 10*3/uL (ref 0.0–0.7)
Monocyte %: 10 %
Neutrophil #: 2.8 10*3/uL (ref 1.4–6.5)
Neutrophil %: 74.4 %
Platelet: 126 10*3/uL — ABNORMAL LOW (ref 150–440)
RBC: 4.17 10*6/uL (ref 3.80–5.20)
RDW: 15.8 % — ABNORMAL HIGH (ref 11.5–14.5)
WBC: 3.8 10*3/uL (ref 3.6–11.0)

## 2011-11-17 LAB — APTT: Activated PTT: 48.6 secs — ABNORMAL HIGH (ref 23.6–35.9)

## 2011-11-17 LAB — TROPONIN I: Troponin-I: 0.03 ng/mL

## 2011-11-17 LAB — PRO B NATRIURETIC PEPTIDE: B-Type Natriuretic Peptide: 2226 pg/mL — ABNORMAL HIGH (ref 0–450)

## 2011-11-17 NOTE — Telephone Encounter (Signed)
To: [facility] (Daytime Triage) Fax: [phone] From: [facility] Date/ Time: 11/17/2011 9:11 AM Taken By: Baldomero Lamy, RN Caller: Hamlin Lions Facility: not collected Patient: Elexia, Friedt DOB: 18-Jun-1920 Phone: [phone] Reason for Call: Pt sent to ED for possible dehydration Regarding Appointment: Appt Date: Appt Time: Unknown Provider: Reason: Details: Outcome:

## 2011-11-17 NOTE — Telephone Encounter (Signed)
Triage Record Num: [id number] Operator: Baldomero Lamy Patient Name: Carrie [name] Call Date & Time: 11/17/2011 8:52:58AM Patient Phone: [phone] PCP: Tillman Abide Patient Gender: Female PCP Fax : [phone] Patient DOB: 04/18/1920 Practice Name: [facility] Reason for Call: Caller: [name]; PCP: Tillman Abide I.; CB#: [phone]; ; ; Call regarding Nausea and Vomiting Diarrhea THE PATIENT REFUSED 911; Pt calling regarding N/V/D-onset 11/14/11 after starting Levaquin on Thursday, 3/7. Pt states she has only urinated 1 x in the last 24 hrs and is it was a small amount. Pt being treated for Bronchitis and URI and now c/o Dysuria. Afebrile. Daughter in law with her. Emergent sxs of Nausea or Vomiting: Signs of dehydration. Advised pt to stop taking Levaquin and have daughter in law take her to the ER now. Pt verbalized understanding and will do so now. Called [facility] and relayed wait time of 15-20 min. Gave a Heads up to [place] in office. Protocol(s) Used: Nausea or Vomiting Recommended Outcome per Protocol: See ED Immediately Reason for Outcome: Signs of dehydration Care Advice: ~ Protect the patient from falling or other harm. ~ Another adult should drive. Call EMS 911 if signs and symptoms of shock develop (such as unable to stand due to faintness, dizziness, or lightheadedness; new onset of confusion; slow to respond or difficult to awaken; skin is pale, gray, cool, or moist to touch; severe weakness; loss of consciousness). ~ ~ IMMEDIATE ACTION Write down provider's name. List or place the following in a bag for transport with the patient: current prescription and/or nonprescription medications; alternative treatments, therapies and medications; and street drugs. ~ May have clear liquids (such as water, clear fruit juices without pulp, soda, tea or coffee without dairy or non-dairy creamer, clear broth or bouillon, oral hydration solution, or plain  gelatin, fruit ices/popsicles, hard candy) but do not eat solid foods before being seen by your provider. ~ 11/17/2011 9:11:02AM Page 1 of 1 [email or web address]

## 2011-11-18 LAB — BASIC METABOLIC PANEL
Anion Gap: 13 (ref 7–16)
Calcium, Total: 8.5 mg/dL (ref 8.5–10.1)
Co2: 32 mmol/L (ref 21–32)
EGFR (African American): 44 — ABNORMAL LOW
EGFR (Non-African Amer.): 36 — ABNORMAL LOW
Glucose: 208 mg/dL — ABNORMAL HIGH (ref 65–99)
Osmolality: 290 (ref 275–301)
Potassium: 4 mmol/L (ref 3.5–5.1)

## 2011-11-18 LAB — CBC WITH DIFFERENTIAL/PLATELET
Basophil %: 0.2 %
Eosinophil %: 0.1 %
HCT: 38.6 % (ref 35.0–47.0)
HGB: 12.6 g/dL (ref 12.0–16.0)
Lymphocyte #: 0.4 10*3/uL — ABNORMAL LOW (ref 1.0–3.6)
MCH: 30.8 pg (ref 26.0–34.0)
MCV: 94 fL (ref 80–100)
Monocyte #: 0.1 10*3/uL (ref 0.0–0.7)
Neutrophil #: 2 10*3/uL (ref 1.4–6.5)
Platelet: 110 10*3/uL — ABNORMAL LOW (ref 150–440)
RBC: 4.11 10*6/uL (ref 3.80–5.20)

## 2011-11-18 LAB — PROTIME-INR: Prothrombin Time: 32.3 secs — ABNORMAL HIGH (ref 11.5–14.7)

## 2011-11-19 DIAGNOSIS — I059 Rheumatic mitral valve disease, unspecified: Secondary | ICD-10-CM

## 2011-11-19 LAB — BASIC METABOLIC PANEL
Anion Gap: 8 (ref 7–16)
BUN: 32 mg/dL — ABNORMAL HIGH (ref 7–18)
Calcium, Total: 8.6 mg/dL (ref 8.5–10.1)
Chloride: 96 mmol/L — ABNORMAL LOW (ref 98–107)
Co2: 32 mmol/L (ref 21–32)
Creatinine: 1.2 mg/dL (ref 0.60–1.30)
Osmolality: 284 (ref 275–301)
Potassium: 3.5 mmol/L (ref 3.5–5.1)

## 2011-11-19 LAB — PROTIME-INR
INR: 5.7
Prothrombin Time: 51 secs — ABNORMAL HIGH (ref 11.5–14.7)

## 2011-11-19 NOTE — Telephone Encounter (Signed)
Noted.  Dee, please call to check on patient.

## 2011-11-19 NOTE — Telephone Encounter (Signed)
Left message on machine at number listed [phone] for patient or family member to call back.

## 2011-11-20 NOTE — Telephone Encounter (Signed)
Left v/m for pt of family to call back.

## 2011-11-21 LAB — PROTIME-INR: Prothrombin Time: 22 secs — ABNORMAL HIGH (ref 11.5–14.7)

## 2011-11-22 LAB — CULTURE, BLOOD (SINGLE)

## 2011-11-23 LAB — CBC WITH DIFFERENTIAL/PLATELET
Basophil #: 0 10*3/uL (ref 0.0–0.1)
Basophil %: 0.1 %
HCT: 37.8 % (ref 35.0–47.0)
Lymphocyte #: 0.8 10*3/uL — ABNORMAL LOW (ref 1.0–3.6)
MCHC: 32.7 g/dL (ref 32.0–36.0)
MCV: 94 fL (ref 80–100)
Monocyte #: 0.3 10*3/uL (ref 0.0–0.7)
Monocyte %: 3.9 %
Platelet: 168 10*3/uL (ref 150–440)
RDW: 15.8 % — ABNORMAL HIGH (ref 11.5–14.5)
WBC: 6.9 10*3/uL (ref 3.6–11.0)

## 2011-11-23 LAB — BASIC METABOLIC PANEL
Anion Gap: 7 (ref 7–16)
BUN: 24 mg/dL — ABNORMAL HIGH (ref 7–18)
Calcium, Total: 8.7 mg/dL (ref 8.5–10.1)
Co2: 32 mmol/L (ref 21–32)
Creatinine: 0.91 mg/dL (ref 0.60–1.30)
EGFR (Non-African Amer.): >60
Glucose: 147 mg/dL — ABNORMAL HIGH (ref 65–99)
Osmolality: 282 (ref 275–301)
Sodium: 138 mmol/L (ref 136–145)

## 2011-11-23 LAB — PROTIME-INR
INR: 1.5
Prothrombin Time: 18.7 secs — ABNORMAL HIGH (ref 11.5–14.7)

## 2011-11-23 LAB — MAGNESIUM: Magnesium: 1.7 mg/dL — ABNORMAL LOW

## 2011-11-25 ENCOUNTER — Ambulatory Visit: Payer: Medicare Other | Admitting: Internal Medicine

## 2011-11-25 DIAGNOSIS — J189 Pneumonia, unspecified organism: Secondary | ICD-10-CM

## 2011-11-25 DIAGNOSIS — I4891 Unspecified atrial fibrillation: Secondary | ICD-10-CM

## 2011-11-25 DIAGNOSIS — I5032 Chronic diastolic (congestive) heart failure: Secondary | ICD-10-CM

## 2011-12-09 ENCOUNTER — Other Ambulatory Visit: Payer: Self-pay | Admitting: Internal Medicine

## 2011-12-09 NOTE — Telephone Encounter (Signed)
Okay #30 x 0 

## 2011-12-09 NOTE — Telephone Encounter (Signed)
rx called into pharmacy

## 2011-12-10 ENCOUNTER — Ambulatory Visit (INDEPENDENT_AMBULATORY_CARE_PROVIDER_SITE_OTHER): Payer: Medicare Other

## 2011-12-10 DIAGNOSIS — I5032 Chronic diastolic (congestive) heart failure: Secondary | ICD-10-CM

## 2011-12-10 DIAGNOSIS — Z7901 Long term (current) use of anticoagulants: Secondary | ICD-10-CM

## 2011-12-10 DIAGNOSIS — I509 Heart failure, unspecified: Secondary | ICD-10-CM

## 2011-12-10 DIAGNOSIS — I4891 Unspecified atrial fibrillation: Secondary | ICD-10-CM

## 2011-12-10 DIAGNOSIS — Z79899 Other long term (current) drug therapy: Secondary | ICD-10-CM

## 2011-12-10 MED ORDER — POTASSIUM CHLORIDE CRYS ER 20 MEQ PO TBCR: 20.0000 meq | EXTENDED_RELEASE_TABLET | Freq: Every day | ORAL | Status: DC

## 2011-12-10 NOTE — Progress Notes (Signed)
Pt states when she went into the hospital her K+ was very low, she was given large doses of KCL in hospital and was discharged on qd to rehab facility.  When she was discharged from rehab she this medication was on her med list however she was not given a rx for KCL.  Made copy of discharge medication list to scan into chart.  Discussed with Dr Mariah Milling and he agreed to sending rx for QD to [facility] pharmacy.  Rx sent electronically and pt is aware.  He also recommended pt have a f/u BMP checked next week at same time as Coumadin check to evaluate K+ levels at present.  Pt is aware we will check next week.  Order put in Epic.

## 2011-12-15 ENCOUNTER — Ambulatory Visit (INDEPENDENT_AMBULATORY_CARE_PROVIDER_SITE_OTHER): Payer: Medicare Other | Admitting: Internal Medicine

## 2011-12-15 ENCOUNTER — Encounter: Payer: Self-pay | Admitting: Internal Medicine

## 2011-12-15 VITALS — BP 122/70 | HR 70 | Temp 98.2°F | Wt 166.0 lb

## 2011-12-15 DIAGNOSIS — I872 Venous insufficiency (chronic) (peripheral): Secondary | ICD-10-CM

## 2011-12-15 DIAGNOSIS — J189 Pneumonia, unspecified organism: Secondary | ICD-10-CM | POA: Insufficient documentation

## 2011-12-15 DIAGNOSIS — I4891 Unspecified atrial fibrillation: Secondary | ICD-10-CM

## 2011-12-15 NOTE — Assessment & Plan Note (Signed)
With SIRS Done with antibiotics About 3 weeks out of hospital and 1 week out of skilled care Doing okay at home with help of DIL No further Rx

## 2011-12-15 NOTE — Assessment & Plan Note (Signed)
With healing secondary ulcers Discussed cleaning, keeping moist (neosporin)

## 2011-12-15 NOTE — Assessment & Plan Note (Signed)
Paced  On coumadin No evidence of tachycardia

## 2011-12-15 NOTE — Progress Notes (Signed)
Subjective:    Patient ID: Carrie [name], female    DOB: [birth year], 78 y.o.   MRN: [id number]  HPI Here with DIL  Admitted to [facility] after last visit here Pneumonia and SIRS Went to skilled nursing for 2 weeks  Getting energy back slowly Gets fatigued at times---has napped at times (not typical for her)  Cough did resolve Breathing is better Walks with walker DIL gets her groceries----patient still cooks/dishes, etc  ongiong wound on left calf Skin came off from bandaid at health care Feet swell and have some pain  No chest pain No palpitations No dizziness or syncope  Current Outpatient Prescriptions on File Prior to Visit  Medication Sig Dispense Refill  . allopurinol (ZYLOPRIM) 100 MG tablet Take 200 mg by mouth daily.       . benazepril (LOTENSIN) 20 MG tablet TAKE 1 TABLET EVERY DAY  30 tablet  6  . cholecalciferol (VITAMIN D) 1000 UNITS tablet Take 1,000 Units by mouth daily.        . clonazePAM (KLONOPIN) 0.5 MG tablet Take 0.5-1 tablets (0.25-0.5 mg total) by mouth at bedtime as needed.  30 tablet  0  . colchicine (COLCRYS) 0.6 MG tablet Take 0.6 mg by mouth daily.       . furosemide (LASIX) 80 MG tablet Take 80 mg by mouth daily. Hold if your weight at home is under 165#       . metolazone (ZAROXOLYN) 2.5 MG tablet Take 1 tablet (2.5 mg total) by mouth daily as needed.  30 tablet  6  . metoprolol (LOPRESSOR) 25 MG tablet Take 25 mg by mouth daily.        . mirtazapine (REMERON) 15 MG tablet TAKE ONE TABLET AT BEDTIME  30 tablet  11  . polyethylene glycol powder (GLYCOLAX/MIRALAX) powder Take 17 g by mouth daily.      . potassium chloride SA (K-DUR,KLOR-CON) 20 MEQ tablet Take 1 tablet (20 mEq total) by mouth daily.  30 tablet  3  . warfarin (COUMADIN) 4 MG tablet Take 2 mg by mouth daily.        Allergies  Allergen Reactions  . Gabapentin Other (See Comments)    Felt terrible, tremors, etc  . Meperidine Hcl Nausea And Vomiting  . Sulfamethoxazole Swelling    REACTION: unspecified  . Tramadol Hcl Nausea Only    Past Medical History  Diagnosis Date  . Atrial fibrillation     Status post AV junction ablation  . Diastolic heart failure     mild volume overload and increased symptoms of shortness of breath  . DJD (degenerative joint disease) of lumbar spine   . Pacemaker     Original implant 1995, generator replacement 2006  . HTN (hypertension)   . History of recurrent UTIs     Dr. Artis Flock   . Sleep disturbance   . Neuropathy   . Gout   . Depression     Past Surgical History  Procedure Date  . Ventricular ablation surgery     atrioventricular nodal ablation  . Knee surgery     right  . Appendectomy   . Breast biopsy   . Pulse generator implant     placement of internal pulse generator for permanent spinal cord- stimulartor left hip  . Ventricular cardiac pacemaker insertion     St. Jude Victory  Pacemaker programmed to ventricular demand pacing  . Insert / replace / remove pacemaker     generator change out 09/29/11  Family History  Problem Relation Age of Onset  . Heart disease Mother     heart failure  . Stroke Father     History   Social History  . Marital Status: Married    Spouse Name: N/A    Number of Children: 2  . Years of Education: N/A   Occupational History  . homemaker    Social History Main Topics  . Smoking status: Former Smoker -- 1.0 packs/day    Types: Cigarettes  . Smokeless tobacco: Never Used   Comment: quit long ago   . Alcohol Use: 0.5 oz/week    1 drink(s) per week  . Drug Use: No  . Sexually Active: Not on file   Other Topics Concern  . Not on file   Social History Narrative   Son Rosanne Ashing holds health care power of attorney. Does request DNR. No tube feeds in cognitively unaware   Review of Systems Sleeping "as good as I ever did"----never great    Objective:   Physical Exam  Constitutional: She appears well-developed and well-nourished. No distress.  Neck: Normal range of  motion. Neck supple.  Cardiovascular: Normal rate, regular rhythm, normal heart sounds and intact distal pulses.  Exam reveals no gallop.   No murmur heard. Pulmonary/Chest: Effort normal and breath sounds normal. No respiratory distress. She has no wheezes. She has no rales.  Musculoskeletal: She exhibits edema.       1+ edema left ankle only  Lymphadenopathy:    She has no cervical adenopathy.  Skin:       3 very superficial ulcers along lateral left calf Early granulation and no inflammation          Assessment & Plan:

## 2011-12-17 ENCOUNTER — Ambulatory Visit (INDEPENDENT_AMBULATORY_CARE_PROVIDER_SITE_OTHER): Payer: Medicare Other

## 2011-12-17 DIAGNOSIS — I5032 Chronic diastolic (congestive) heart failure: Secondary | ICD-10-CM

## 2011-12-17 DIAGNOSIS — I509 Heart failure, unspecified: Secondary | ICD-10-CM

## 2011-12-17 DIAGNOSIS — I4891 Unspecified atrial fibrillation: Secondary | ICD-10-CM

## 2011-12-17 DIAGNOSIS — Z7901 Long term (current) use of anticoagulants: Secondary | ICD-10-CM

## 2011-12-17 DIAGNOSIS — Z79899 Other long term (current) drug therapy: Secondary | ICD-10-CM

## 2011-12-17 LAB — POCT INR: INR: 2.3

## 2011-12-18 LAB — BASIC METABOLIC PANEL
Calcium: 9.5 mg/dL (ref 8.6–10.2)
Creatinine, Ser: 1.05 mg/dL — ABNORMAL HIGH (ref 0.57–1.00)
GFR calc non Af Amer: 47 mL/min/{1.73_m2} — ABNORMAL LOW (ref >59)
Glucose: 103 mg/dL — ABNORMAL HIGH (ref 65–99)

## 2011-12-19 ENCOUNTER — Telehealth: Payer: Self-pay | Admitting: *Deleted

## 2011-12-19 NOTE — Telephone Encounter (Signed)
Lab results given--pt agrees--nt

## 2012-01-07 ENCOUNTER — Other Ambulatory Visit: Payer: Self-pay | Admitting: Internal Medicine

## 2012-01-07 ENCOUNTER — Ambulatory Visit (INDEPENDENT_AMBULATORY_CARE_PROVIDER_SITE_OTHER): Payer: Medicare Other

## 2012-01-07 DIAGNOSIS — Z7901 Long term (current) use of anticoagulants: Secondary | ICD-10-CM

## 2012-01-07 DIAGNOSIS — I4891 Unspecified atrial fibrillation: Secondary | ICD-10-CM

## 2012-01-07 LAB — POCT INR: INR: 1.6

## 2012-01-07 NOTE — Telephone Encounter (Signed)
rx called into pharmacy

## 2012-01-07 NOTE — Telephone Encounter (Signed)
Okay #30 x 1 

## 2012-01-15 ENCOUNTER — Encounter: Payer: Medicare Other | Admitting: *Deleted

## 2012-01-21 ENCOUNTER — Ambulatory Visit (INDEPENDENT_AMBULATORY_CARE_PROVIDER_SITE_OTHER): Payer: Medicare Other

## 2012-01-21 DIAGNOSIS — Z7901 Long term (current) use of anticoagulants: Secondary | ICD-10-CM

## 2012-01-21 DIAGNOSIS — I4891 Unspecified atrial fibrillation: Secondary | ICD-10-CM

## 2012-01-21 LAB — POCT INR: INR: 1.8

## 2012-01-23 ENCOUNTER — Encounter: Payer: Self-pay | Admitting: *Deleted

## 2012-02-02 ENCOUNTER — Encounter: Payer: Self-pay | Admitting: Internal Medicine

## 2012-02-02 LAB — REMOTE PACEMAKER DEVICE
BRDY-0002RV: 70 {beats}/min
DEVICE MODEL PM: 7280550

## 2012-02-03 ENCOUNTER — Ambulatory Visit (INDEPENDENT_AMBULATORY_CARE_PROVIDER_SITE_OTHER): Payer: Medicare Other | Admitting: *Deleted

## 2012-02-03 DIAGNOSIS — I4891 Unspecified atrial fibrillation: Secondary | ICD-10-CM

## 2012-02-03 DIAGNOSIS — I442 Atrioventricular block, complete: Secondary | ICD-10-CM

## 2012-02-04 ENCOUNTER — Ambulatory Visit (INDEPENDENT_AMBULATORY_CARE_PROVIDER_SITE_OTHER): Payer: Medicare Other

## 2012-02-04 DIAGNOSIS — Z7901 Long term (current) use of anticoagulants: Secondary | ICD-10-CM

## 2012-02-04 DIAGNOSIS — I4891 Unspecified atrial fibrillation: Secondary | ICD-10-CM

## 2012-02-04 LAB — POCT INR: INR: 2.8

## 2012-02-05 ENCOUNTER — Ambulatory Visit (INDEPENDENT_AMBULATORY_CARE_PROVIDER_SITE_OTHER): Payer: Medicare Other | Admitting: Family Medicine

## 2012-02-05 ENCOUNTER — Encounter: Payer: Self-pay | Admitting: Family Medicine

## 2012-02-05 VITALS — BP 110/72 | HR 75 | Temp 97.6°F | Wt 170.8 lb

## 2012-02-05 DIAGNOSIS — R300 Dysuria: Secondary | ICD-10-CM

## 2012-02-05 DIAGNOSIS — N390 Urinary tract infection, site not specified: Secondary | ICD-10-CM

## 2012-02-05 DIAGNOSIS — I4891 Unspecified atrial fibrillation: Secondary | ICD-10-CM

## 2012-02-05 DIAGNOSIS — Z7901 Long term (current) use of anticoagulants: Secondary | ICD-10-CM

## 2012-02-05 LAB — POCT URINALYSIS DIPSTICK
Bilirubin, UA: NEGATIVE
Glucose, UA: NEGATIVE
Nitrite, UA: NEGATIVE
Spec Grav, UA: <=1.005

## 2012-02-05 MED ORDER — NITROFURANTOIN MONOHYD MACRO 100 MG PO CAPS: 100.0000 mg | ORAL_CAPSULE | Freq: Two times a day (BID) | ORAL | Status: AC

## 2012-02-05 NOTE — Patient Instructions (Signed)
INR recheck next Wed. In [place] at Dr. Ethelene Hal

## 2012-02-05 NOTE — Progress Notes (Signed)
Patient presents with burning, urgency. 2 d No vaginal discharge or external irritation.  No STD exposure. No abd pain, no flank pain.  3 pound weight gain  Patient Active Problem List  Diagnoses  . NEUROPATHY  . ESSENTIAL HYPERTENSION  . Atrial fibrillation  . DIASTOLIC HEART FAILURE, CHRONIC  . DEGENERATIVE JOINT DISEASE  . OSTEOPOROSIS  . SLEEP DISORDER, CHRONIC  . GOUT  . VENOUS INSUFFICIENCY  . Long term current use of anticoagulant  . Anxiety  . SOB (shortness of breath)  . Pulmonary HTN  . Depression  . Complete heart block  . Pacemaker  . Pneumonia   Past Medical History  Diagnosis Date  . Atrial fibrillation     Status post AV junction ablation  . Diastolic heart failure     mild volume overload and increased symptoms of shortness of breath  . DJD (degenerative joint disease) of lumbar spine   . Pacemaker     Original implant 1995, generator replacement 2006  . HTN (hypertension)   . History of recurrent UTIs     Dr. Artis Flock   . Sleep disturbance   . Neuropathy   . Gout   . Depression    Past Surgical History  Procedure Date  . Ventricular ablation surgery     atrioventricular nodal ablation  . Knee surgery     right  . Appendectomy   . Breast biopsy   . Pulse generator implant     placement of internal pulse generator for permanent spinal cord- stimulartor left hip  . Ventricular cardiac pacemaker insertion     [employer] Jude Victory  Pacemaker programmed to ventricular demand pacing  . Insert / replace / remove pacemaker     generator change out 09/29/11   History  Substance Use Topics  . Smoking status: Former Smoker -- 1.0 packs/day    Types: Cigarettes  . Smokeless tobacco: Never Used   Comment: quit long ago   . Alcohol Use: 0.5 oz/week    1 drink(s) per week   Family History  Problem Relation Age of Onset  . Heart disease Mother     heart failure  . Stroke Father    Allergies  Allergen Reactions  . Gabapentin Other (See Comments)   Felt terrible, tremors, etc  . Meperidine Hcl Nausea And Vomiting  . Sulfamethoxazole Swelling    REACTION: unspecified  . Tramadol Hcl Nausea Only   Current Outpatient Prescriptions on File Prior to Visit  Medication Sig Dispense Refill  . allopurinol (ZYLOPRIM) 100 MG tablet Take 200 mg by mouth daily.       . benazepril (LOTENSIN) 20 MG tablet TAKE 1 TABLET EVERY DAY  30 tablet  6  . cholecalciferol (VITAMIN D) 1000 UNITS tablet Take 1,000 Units by mouth daily.        . clonazePAM (KLONOPIN) 0.5 MG tablet TAKE ONE-HALF TO ONE TABLET AT BEDTIME  AS NEEDED  30 tablet  1  . colchicine (COLCRYS) 0.6 MG tablet Take 0.6 mg by mouth daily.       . furosemide (LASIX) 80 MG tablet Take 80 mg by mouth daily. Hold if your weight at home is under 165#       . metolazone (ZAROXOLYN) 2.5 MG tablet Take 1 tablet (2.5 mg total) by mouth daily as needed.  30 tablet  6  . metoprolol (LOPRESSOR) 25 MG tablet Take 25 mg by mouth daily.        . mirtazapine (REMERON) 15 MG tablet TAKE  ONE TABLET AT BEDTIME  30 tablet  11  . polyethylene glycol powder (GLYCOLAX/MIRALAX) powder Take 17 g by mouth daily.      . potassium chloride SA (K-DUR,KLOR-CON) 20 MEQ tablet Take 1 tablet (20 mEq total) by mouth daily.  30 tablet  3  . warfarin (COUMADIN) 4 MG tablet Take 2 mg by mouth daily.         ROS: GEN:  no fevers, chills. GI: No n/v/d, eating normally Otherwise, ROS is as per the HPI.  PHYSICAL EXAM  Blood pressure 110/72, pulse 75, temperature 97.6 F (36.4 C), temperature source Oral, weight 170 lb 12 oz (77.452 kg).  GEN: WDWN, A&Ox4,NAD. Non-toxic HEENT: Atraumatc, normocephalic. CV: RRR, No M/G/R PULM: CTA B, No wheezes, crackles, or rhonchi ABD: S, NT, ND, +BS, no rebound. No CVAT. No suprapubic tenderness. EXT: No c/c/e  A/P: UTI. Rx with ABX as below 2. U cx 3. Check INR next wed. 4. AF on coumadin 5. HF: cont lasix 80 mg AM, add zaroxolyn prn with weight gain per cards instr

## 2012-02-08 LAB — URINE CULTURE: Colony Count: >=100000

## 2012-02-09 ENCOUNTER — Encounter: Payer: Self-pay | Admitting: *Deleted

## 2012-02-11 ENCOUNTER — Encounter (INDEPENDENT_AMBULATORY_CARE_PROVIDER_SITE_OTHER): Payer: Medicare Other

## 2012-02-11 DIAGNOSIS — Z7901 Long term (current) use of anticoagulants: Secondary | ICD-10-CM

## 2012-02-11 DIAGNOSIS — I4891 Unspecified atrial fibrillation: Secondary | ICD-10-CM

## 2012-03-03 ENCOUNTER — Ambulatory Visit (INDEPENDENT_AMBULATORY_CARE_PROVIDER_SITE_OTHER): Payer: Medicare Other

## 2012-03-03 DIAGNOSIS — I4891 Unspecified atrial fibrillation: Secondary | ICD-10-CM

## 2012-03-03 DIAGNOSIS — Z7901 Long term (current) use of anticoagulants: Secondary | ICD-10-CM

## 2012-03-08 ENCOUNTER — Other Ambulatory Visit: Payer: Self-pay | Admitting: Internal Medicine

## 2012-03-08 NOTE — Telephone Encounter (Signed)
Okay #30 x 1 

## 2012-03-08 NOTE — Telephone Encounter (Signed)
rx called into pharmacy

## 2012-03-18 ENCOUNTER — Other Ambulatory Visit: Payer: Self-pay | Admitting: *Deleted

## 2012-03-18 MED ORDER — METOPROLOL TARTRATE 25 MG PO TABS: 25.0000 mg | ORAL_TABLET | Freq: Two times a day (BID) | ORAL | Status: DC

## 2012-03-31 ENCOUNTER — Ambulatory Visit (INDEPENDENT_AMBULATORY_CARE_PROVIDER_SITE_OTHER): Payer: Medicare Other

## 2012-03-31 DIAGNOSIS — I4891 Unspecified atrial fibrillation: Secondary | ICD-10-CM

## 2012-03-31 DIAGNOSIS — Z7901 Long term (current) use of anticoagulants: Secondary | ICD-10-CM

## 2012-03-31 LAB — POCT INR: INR: 2.7

## 2012-04-06 ENCOUNTER — Ambulatory Visit (INDEPENDENT_AMBULATORY_CARE_PROVIDER_SITE_OTHER): Payer: Medicare Other | Admitting: Cardiovascular Disease

## 2012-04-06 ENCOUNTER — Encounter: Payer: Self-pay | Admitting: Cardiovascular Disease

## 2012-04-06 VITALS — BP 108/70 | HR 70 | Ht 64.0 in | Wt 170.5 lb

## 2012-04-06 DIAGNOSIS — I2789 Other specified pulmonary heart diseases: Secondary | ICD-10-CM

## 2012-04-06 DIAGNOSIS — I5032 Chronic diastolic (congestive) heart failure: Secondary | ICD-10-CM

## 2012-04-06 DIAGNOSIS — I442 Atrioventricular block, complete: Secondary | ICD-10-CM

## 2012-04-06 DIAGNOSIS — I272 Pulmonary hypertension, unspecified: Secondary | ICD-10-CM

## 2012-04-06 DIAGNOSIS — R0602 Shortness of breath: Secondary | ICD-10-CM

## 2012-04-06 NOTE — Assessment & Plan Note (Signed)
Pacemaker dependent most of the time. Relatively asymptomatic. Recent pacer change out.

## 2012-04-06 NOTE — Assessment & Plan Note (Signed)
She is doing very well on her current medication regimen. Weight has been relatively stable. Very mild shortness of breath recently and she will take a Zaroxolyn with her Lasix. She is happy with her current status.

## 2012-04-06 NOTE — Progress Notes (Signed)
Patient ID: Carrie [name], female    DOB: March 17, 1920, 78 y.o.   MRN: [id number]  HPI Comments: 78 year old white female patient with a h/o diastolic heart failure,  pneumonia 01/2010, severe pulmonary hypertension on echo,  long history of back problems requiring surgery,  chronic atrial fibrillation with AV node ablation, placement of pacemaker, who presents for followup.   Previously, she had a 15 pound weight loss using Lasix. she has been taking Lasix 80 mg daily and takes metolazone 2.5 mg rarely, approximately every other week for shortness of breath or edema. She does better with metolazone and Lasix then she does with Lasix b.i.d.. She feels her weight has been stable in the 165 range. Today she is up to 167 pounds at home (170 pounds by our scale).  When her weight goes to 168, she takes metolazone.   Pacemaker change out  by Dr. Graciela Husbands with no problems.   EKG showing ventricular paced rhythm, rate 70 beats per minute    Outpatient Encounter Prescriptions as of 04/06/2012  Medication Sig Dispense Refill  . allopurinol (ZYLOPRIM) 100 MG tablet Take 200 mg by mouth daily.       . benazepril (LOTENSIN) 20 MG tablet TAKE 1 TABLET EVERY DAY  30 tablet  6  . cholecalciferol (VITAMIN D) 1000 UNITS tablet Take 1,000 Units by mouth daily.        . clonazePAM (KLONOPIN) 0.5 MG tablet Take 0.5 mg by mouth daily.       . colchicine (COLCRYS) 0.6 MG tablet Take 0.6 mg by mouth every other day.       . furosemide (LASIX) 80 MG tablet Take 80 mg by mouth daily. Hold if your weight at home is under 165#       . metolazone (ZAROXOLYN) 2.5 MG tablet Take 1 tablet (2.5 mg total) by mouth daily as needed.  30 tablet  6  . metoprolol tartrate (LOPRESSOR) 25 MG tablet Take 25 mg by mouth daily.      . mirtazapine (REMERON) 15 MG tablet TAKE ONE TABLET AT BEDTIME  30 tablet  11  . potassium chloride SA (K-DUR,KLOR-CON) 20 MEQ tablet Take 1 tablet (20 mEq total) by mouth daily.  30 tablet  3  . warfarin  (COUMADIN) 4 MG tablet Take 2 mg by mouth daily.       Review of Systems  Constitutional: Negative.   HENT: Negative.   Eyes: Negative.   Respiratory: Negative.   Cardiovascular: Negative.   Gastrointestinal: Negative.   Musculoskeletal: Positive for back pain, joint swelling and arthralgias.  Skin: Negative.   Neurological: Negative.   Hematological: Negative.   All other systems reviewed and are negative.    BP 108/70  Pulse 70  Ht 5\' 4"  (1.626 m)  Wt 170 lb 8 oz (77.338 kg)  BMI 29.27 kg/m2  Physical Exam  Nursing note and vitals reviewed. Constitutional: She is oriented to person, place, and time. She appears well-developed and well-nourished.  HENT:  Head: Normocephalic.  Nose: Nose normal.  Mouth/Throat: Oropharynx is clear and moist.  Eyes: Conjunctivae are normal. Pupils are equal, round, and reactive to light.  Neck: Normal range of motion. Neck supple. No JVD present.  Cardiovascular: Normal rate, regular rhythm, S1 normal, S2 normal and intact distal pulses.  Exam reveals no gallop and no friction rub.   Murmur heard.  Crescendo systolic murmur is present with a grade of 2/6  Pulmonary/Chest: Effort normal and breath sounds normal. No respiratory distress.  She has no wheezes. She has no rales. She exhibits no tenderness.  Abdominal: Soft. Bowel sounds are normal. She exhibits no distension. There is no tenderness.  Musculoskeletal: Normal range of motion. She exhibits no edema and no tenderness.  Lymphadenopathy:    She has no cervical adenopathy.  Neurological: She is alert and oriented to person, place, and time. Coordination normal.  Skin: Skin is warm and dry. No rash noted. No erythema.  Psychiatric: She has a normal mood and affect. Her behavior is normal. Judgment and thought content normal.         Assessment and Plan

## 2012-04-06 NOTE — Patient Instructions (Addendum)
You are doing well. No medication changes were made.  Please call us if you have new issues that need to be addressed before your next appt.  Your physician wants you to follow-up in: 6 months.  You will receive a reminder letter in the mail two months in advance. If you don't receive a letter, please call our office to schedule the follow-up appointment.   

## 2012-04-06 NOTE — Assessment & Plan Note (Signed)
Mild shortness of breath, stable. Weight has been stable with her current diuretic regimen.

## 2012-04-07 LAB — BASIC METABOLIC PANEL
BUN/Creatinine Ratio: 34 — ABNORMAL HIGH (ref 11–26)
Calcium: 9.2 mg/dL (ref 8.6–10.2)
GFR calc Af Amer: 48 mL/min/{1.73_m2} — ABNORMAL LOW (ref >59)
GFR calc non Af Amer: 42 mL/min/{1.73_m2} — ABNORMAL LOW (ref >59)
Potassium: 3.9 mmol/L (ref 3.5–5.2)
Sodium: 137 mmol/L (ref 134–144)

## 2012-04-15 ENCOUNTER — Ambulatory Visit (INDEPENDENT_AMBULATORY_CARE_PROVIDER_SITE_OTHER): Payer: Medicare Other | Admitting: Internal Medicine

## 2012-04-15 ENCOUNTER — Encounter: Payer: Self-pay | Admitting: Internal Medicine

## 2012-04-15 VITALS — BP 118/60 | HR 74 | Temp 97.8°F | Ht 64.0 in | Wt 169.0 lb

## 2012-04-15 DIAGNOSIS — I10 Essential (primary) hypertension: Secondary | ICD-10-CM

## 2012-04-15 DIAGNOSIS — M109 Gout, unspecified: Secondary | ICD-10-CM

## 2012-04-15 DIAGNOSIS — G479 Sleep disorder, unspecified: Secondary | ICD-10-CM

## 2012-04-15 DIAGNOSIS — I5032 Chronic diastolic (congestive) heart failure: Secondary | ICD-10-CM

## 2012-04-15 DIAGNOSIS — I4891 Unspecified atrial fibrillation: Secondary | ICD-10-CM

## 2012-04-15 NOTE — Assessment & Plan Note (Signed)
Has been quiet 

## 2012-04-15 NOTE — Assessment & Plan Note (Addendum)
Doing okay with the current meds mirtazapine also helps mood Clonazepam allows her to sleep despite the neuropathic pain

## 2012-04-15 NOTE — Assessment & Plan Note (Signed)
Paced rhthym Continues on coumadin

## 2012-04-15 NOTE — Assessment & Plan Note (Signed)
Has done well Tightly regulates her weight and has minimized symptoms

## 2012-04-15 NOTE — Assessment & Plan Note (Signed)
BP Readings from Last 3 Encounters:  04/15/12 118/60  04/06/12 108/70  02/05/12 110/72   Good control No changes due to CHF Rx

## 2012-04-15 NOTE — Progress Notes (Signed)
Subjective:    Patient ID: Carrie [name], female    DOB: 25-Feb-1920, 78 y.o.   MRN: [id number]  HPI Has been doing well Just saw Dr Georgiann Hahn new concerns She weighs every other morning---adjusts her diuretics based on weight Goal at home 165-168#----if above this, she adds zaroxlyn (perhaps once every 2 weeks)  Breathing generally okay unless weight is up Props head with 6 inch risers and then pillow Can tell she is holding onto fluid ---gets PND.  No sig edema  Has had some right chest pain Relates to indigestion--- sharp pain and then throat feels funny. Brief No meds for this but keeps tums around in case No palpitations  Sleep is still not that great Some nights can be very bad ONly scattered bouts with depression---mostly due to 2 friends in hospice Burning neuropathy is controlled with clonazepam  Current Outpatient Prescriptions on File Prior to Visit  Medication Sig Dispense Refill  . allopurinol (ZYLOPRIM) 100 MG tablet Take 200 mg by mouth daily.       . benazepril (LOTENSIN) 20 MG tablet TAKE 1 TABLET EVERY DAY  30 tablet  6  . cholecalciferol (VITAMIN D) 1000 UNITS tablet Take 1,000 Units by mouth daily.        . clonazePAM (KLONOPIN) 0.5 MG tablet Take 0.5 mg by mouth daily.       . colchicine (COLCRYS) 0.6 MG tablet Take 0.6 mg by mouth every other day.       . furosemide (LASIX) 80 MG tablet Take 80 mg by mouth daily. Hold if your weight at home is under 165#       . metolazone (ZAROXOLYN) 2.5 MG tablet Take 1 tablet (2.5 mg total) by mouth daily as needed.  30 tablet  6  . metoprolol tartrate (LOPRESSOR) 25 MG tablet Take 25 mg by mouth daily.      . mirtazapine (REMERON) 15 MG tablet TAKE ONE TABLET AT BEDTIME  30 tablet  11  . potassium chloride SA (K-DUR,KLOR-CON) 20 MEQ tablet Take 1 tablet (20 mEq total) by mouth daily.  30 tablet  3  . warfarin (COUMADIN) 4 MG tablet Take 2 mg by mouth daily.        Allergies  Allergen Reactions  . Gabapentin  Other (See Comments)    Felt terrible, tremors, etc  . Meperidine Hcl Nausea And Vomiting  . Sulfamethoxazole Swelling    REACTION: unspecified  . Tramadol Hcl Nausea Only    Past Medical History  Diagnosis Date  . Atrial fibrillation     Status post AV junction ablation  . Diastolic heart failure     mild volume overload and increased symptoms of shortness of breath  . DJD (degenerative joint disease) of lumbar spine   . Pacemaker     Original implant 1995, generator replacement 2006  . HTN (hypertension)   . History of recurrent UTIs     Dr. Artis Flock   . Sleep disturbance   . Neuropathy   . Gout   . Depression     Past Surgical History  Procedure Date  . Ventricular ablation surgery     atrioventricular nodal ablation  . Knee surgery     right  . Appendectomy   . Breast biopsy   . Pulse generator implant     placement of internal pulse generator for permanent spinal cord- stimulartor left hip  . Ventricular cardiac pacemaker insertion     [employer]. Jude Victory  Pacemaker programmed to ventricular demand  pacing  . Insert / replace / remove pacemaker     generator change out 09/29/11    Family History  Problem Relation Age of Onset  . Heart disease Mother     heart failure  . Stroke Father     History   Social History  . Marital Status: Married    Spouse Name: N/A    Number of Children: 2  . Years of Education: N/A   Occupational History  . homemaker    Social History Main Topics  . Smoking status: Former Smoker -- 1.0 packs/day    Types: Cigarettes  . Smokeless tobacco: Never Used   Comment: quit long ago   . Alcohol Use: 0.5 oz/week    1 drink(s) per week  . Drug Use: No  . Sexually Active: Not on file   Other Topics Concern  . Not on file   Social History Narrative   Son Rosanne Ashing holds health care power of attorney. Does request DNR. No tube feeds in cognitively unaware   Review of Systems Leg ulcers have healed Still has constipation---does better  with daily enulose    Objective:   Physical Exam  Constitutional: She appears well-developed and well-nourished. No distress.  Neck: Normal range of motion. Neck supple.  Cardiovascular: Normal rate, regular rhythm and normal heart sounds.  Exam reveals no gallop.   No murmur heard. Pulmonary/Chest: Effort normal and breath sounds normal. No respiratory distress. She has no wheezes. She has no rales.  Abdominal: Soft. There is no tenderness.  Musculoskeletal: She exhibits no edema and no tenderness.  Lymphadenopathy:    She has no cervical adenopathy.  Skin:       No calf ulcers  Psychiatric: She has a normal mood and affect. Her behavior is normal.          Assessment & Plan:

## 2012-04-28 ENCOUNTER — Ambulatory Visit (INDEPENDENT_AMBULATORY_CARE_PROVIDER_SITE_OTHER): Payer: Medicare Other

## 2012-04-28 ENCOUNTER — Other Ambulatory Visit: Payer: Self-pay | Admitting: Internal Medicine

## 2012-04-28 ENCOUNTER — Other Ambulatory Visit: Payer: Self-pay

## 2012-04-28 DIAGNOSIS — Z7901 Long term (current) use of anticoagulants: Secondary | ICD-10-CM

## 2012-04-28 DIAGNOSIS — I4891 Unspecified atrial fibrillation: Secondary | ICD-10-CM

## 2012-04-28 MED ORDER — POTASSIUM CHLORIDE CRYS ER 20 MEQ PO TBCR: 20.0000 meq | EXTENDED_RELEASE_TABLET | Freq: Every day | ORAL | Status: DC

## 2012-04-28 NOTE — Telephone Encounter (Signed)
Refill sent for potassium chloride SA 20 Meq take one tablet daily.

## 2012-05-05 ENCOUNTER — Other Ambulatory Visit: Payer: Self-pay | Admitting: Internal Medicine

## 2012-05-05 ENCOUNTER — Other Ambulatory Visit: Payer: Self-pay | Admitting: *Deleted

## 2012-05-05 NOTE — Telephone Encounter (Signed)
OK to refill

## 2012-05-06 ENCOUNTER — Ambulatory Visit (INDEPENDENT_AMBULATORY_CARE_PROVIDER_SITE_OTHER): Payer: Medicare Other | Admitting: *Deleted

## 2012-05-06 DIAGNOSIS — I442 Atrioventricular block, complete: Secondary | ICD-10-CM

## 2012-05-06 MED ORDER — CLONAZEPAM 0.5 MG PO TABS: 0.5000 mg | ORAL_TABLET | Freq: Every evening | ORAL | Status: DC | PRN

## 2012-05-06 NOTE — Telephone Encounter (Signed)
rx called into pharmacy

## 2012-05-06 NOTE — Telephone Encounter (Signed)
Okay #30 x 1 Specify at bedtime if that is when she takes it--I think it is

## 2012-05-07 ENCOUNTER — Encounter: Payer: Self-pay | Admitting: *Deleted

## 2012-05-07 ENCOUNTER — Encounter: Payer: Self-pay | Admitting: Internal Medicine

## 2012-05-07 LAB — REMOTE PACEMAKER DEVICE
BATTERY VOLTAGE: 2.99 V
RV LEAD AMPLITUDE: 12 mv
RV LEAD IMPEDENCE PM: 750 Ohm
VENTRICULAR PACING PM: 100

## 2012-05-12 ENCOUNTER — Ambulatory Visit (INDEPENDENT_AMBULATORY_CARE_PROVIDER_SITE_OTHER): Payer: Medicare Other

## 2012-05-12 ENCOUNTER — Encounter: Payer: Self-pay | Admitting: *Deleted

## 2012-05-12 DIAGNOSIS — I4891 Unspecified atrial fibrillation: Secondary | ICD-10-CM

## 2012-05-12 DIAGNOSIS — Z7901 Long term (current) use of anticoagulants: Secondary | ICD-10-CM

## 2012-05-12 LAB — POCT INR: INR: 2.7

## 2012-05-28 ENCOUNTER — Other Ambulatory Visit: Payer: Self-pay | Admitting: Internal Medicine

## 2012-05-31 ENCOUNTER — Other Ambulatory Visit: Payer: Self-pay | Admitting: *Deleted

## 2012-05-31 MED ORDER — BENAZEPRIL HCL 20 MG PO TABS: 20.0000 mg | ORAL_TABLET | Freq: Every day | ORAL | Status: DC

## 2012-05-31 NOTE — Telephone Encounter (Signed)
Refilled Benazepril.

## 2012-06-02 ENCOUNTER — Ambulatory Visit (INDEPENDENT_AMBULATORY_CARE_PROVIDER_SITE_OTHER): Payer: Medicare Other

## 2012-06-02 ENCOUNTER — Telehealth: Payer: Self-pay | Admitting: Internal Medicine

## 2012-06-02 DIAGNOSIS — I4891 Unspecified atrial fibrillation: Secondary | ICD-10-CM

## 2012-06-02 DIAGNOSIS — Z7901 Long term (current) use of anticoagulants: Secondary | ICD-10-CM

## 2012-06-02 LAB — POCT INR: INR: 2.2

## 2012-06-02 NOTE — Telephone Encounter (Signed)
New Problem:    Patient called in wanting to reschedule when wireless transmission to 08/11/12 because she will be out of town on 08/09/12.  Please call back.

## 2012-06-02 NOTE — Telephone Encounter (Signed)
Scheduled remote check for 08-16-12. Pt aware and wrote on calendar.

## 2012-06-07 ENCOUNTER — Other Ambulatory Visit: Payer: Self-pay

## 2012-06-07 MED ORDER — POTASSIUM CHLORIDE CRYS ER 20 MEQ PO TBCR: 20.0000 meq | EXTENDED_RELEASE_TABLET | Freq: Every day | ORAL | Status: DC

## 2012-06-07 NOTE — Telephone Encounter (Signed)
Refill sent for potassium 20 meq take one tablet

## 2012-06-16 ENCOUNTER — Other Ambulatory Visit: Payer: Self-pay | Admitting: Internal Medicine

## 2012-06-25 ENCOUNTER — Other Ambulatory Visit: Payer: Self-pay | Admitting: *Deleted

## 2012-06-25 MED ORDER — WARFARIN SODIUM 4 MG PO TABS: 2.0000 mg | ORAL_TABLET | ORAL | Status: DC

## 2012-06-30 ENCOUNTER — Ambulatory Visit (INDEPENDENT_AMBULATORY_CARE_PROVIDER_SITE_OTHER): Payer: Medicare Other

## 2012-06-30 DIAGNOSIS — Z7901 Long term (current) use of anticoagulants: Secondary | ICD-10-CM

## 2012-06-30 DIAGNOSIS — I4891 Unspecified atrial fibrillation: Secondary | ICD-10-CM

## 2012-06-30 LAB — POCT INR: INR: 3.1

## 2012-07-05 ENCOUNTER — Other Ambulatory Visit: Payer: Self-pay | Admitting: *Deleted

## 2012-07-05 MED ORDER — CLONAZEPAM 0.5 MG PO TABS: 0.5000 mg | ORAL_TABLET | Freq: Every evening | ORAL | Status: DC | PRN

## 2012-07-05 NOTE — Telephone Encounter (Signed)
rx called into pharmacy

## 2012-07-05 NOTE — Telephone Encounter (Signed)
Okay #30 x 1 

## 2012-07-14 ENCOUNTER — Ambulatory Visit (INDEPENDENT_AMBULATORY_CARE_PROVIDER_SITE_OTHER): Payer: Medicare Other | Admitting: Internal Medicine

## 2012-07-14 ENCOUNTER — Encounter: Payer: Self-pay | Admitting: Internal Medicine

## 2012-07-14 VITALS — BP 120/68 | HR 72 | Temp 97.7°F | Ht 61.5 in | Wt 175.0 lb

## 2012-07-14 DIAGNOSIS — I4891 Unspecified atrial fibrillation: Secondary | ICD-10-CM

## 2012-07-14 DIAGNOSIS — I5032 Chronic diastolic (congestive) heart failure: Secondary | ICD-10-CM

## 2012-07-14 DIAGNOSIS — R350 Frequency of micturition: Secondary | ICD-10-CM

## 2012-07-14 DIAGNOSIS — L299 Pruritus, unspecified: Secondary | ICD-10-CM | POA: Insufficient documentation

## 2012-07-14 DIAGNOSIS — G589 Mononeuropathy, unspecified: Secondary | ICD-10-CM

## 2012-07-14 DIAGNOSIS — I10 Essential (primary) hypertension: Secondary | ICD-10-CM

## 2012-07-14 LAB — POCT URINALYSIS DIPSTICK
Bilirubin, UA: NEGATIVE
Glucose, UA: NEGATIVE
Ketones, UA: NEGATIVE
Spec Grav, UA: <=1.005
Urobilinogen, UA: 0.2

## 2012-07-14 NOTE — Patient Instructions (Signed)
Please try cetirizine 10mg  --- 1/2-1 tab at bedtime for the itching. If not improving, we can consider reviewing your meds (have Phil call me)

## 2012-07-14 NOTE — Assessment & Plan Note (Signed)
No answers after 2 derm visits Will have her try cetirizine ?try off benazepril (and substitute losartan) ?try off allopurinol

## 2012-07-14 NOTE — Assessment & Plan Note (Signed)
Ongoing clonzepam helps her sleep

## 2012-07-14 NOTE — Assessment & Plan Note (Signed)
Stable status Weight up seems to be from eating more No changes

## 2012-07-14 NOTE — Assessment & Plan Note (Signed)
Paced On coumadin 

## 2012-07-14 NOTE — Progress Notes (Signed)
Subjective:    Patient ID: Carrie [name], female    DOB: 1920-07-17, 78 y.o.   MRN: [id number]  HPI Doing fairly well Only new concern is itching on her scalp and body (especially back) No new products that she is using Saw dermatologist twice-- Rx hasn't helped Now notes itching in low back  No cough No mouth or lip swelling  Monitors weight---up some due to eating well Fluid is not building up Uses the zaroxlyn intermittently No chest pain NO SOB No dizziness or syncope  Neuropathy persists Really affects her feet---get very hot at night Uses the clonazepam to allow her to sleep  Mood has been okay--other than the itching problem  Current Outpatient Prescriptions on File Prior to Visit  Medication Sig Dispense Refill  . allopurinol (ZYLOPRIM) 100 MG tablet Take 200 mg by mouth daily.       . benazepril (LOTENSIN) 20 MG tablet Take 1 tablet (20 mg total) by mouth daily.  30 tablet  6  . cholecalciferol (VITAMIN D) 1000 UNITS tablet Take 1,000 Units by mouth daily.        . clonazePAM (KLONOPIN) 0.5 MG tablet Take 1 tablet (0.5 mg total) by mouth at bedtime as needed.  30 tablet  1  . COLCRYS 0.6 MG tablet TAKE 1 TABLET TWICE A DAY AS NEEDED     FOR GOUT PAIN  60 tablet  11  . furosemide (LASIX) 80 MG tablet Take 80 mg by mouth daily. Hold if your weight at home is under 165#       . furosemide (LASIX) 80 MG tablet TAKE ONE TABLET EVERY MORNING HOLD IF   YOUR WEIGHT AT  HOME IS [id number] POUNDS  30 tablet  11  . ketoconazole (NIZORAL) 2 % shampoo       . lactulose (CHRONULAC) 10 GM/15ML solution TAKE 15 TO 30 ML BY MOUTH TWICE DAILY ASNEEDED FOR CONSTIPATION  976 mL  0  . metolazone (ZAROXOLYN) 2.5 MG tablet Take 1 tablet (2.5 mg total) by mouth daily as needed.  30 tablet  6  . metoprolol tartrate (LOPRESSOR) 25 MG tablet TAKE ONE TABLET TWICE A DAY  60 tablet  11  . mirtazapine (REMERON) 15 MG tablet TAKE ONE TABLET AT BEDTIME  30 tablet  11  . potassium chloride SA  (K-DUR,KLOR-CON) 20 MEQ tablet Take 1 tablet (20 mEq total) by mouth daily.  30 tablet  3  . warfarin (COUMADIN) 4 MG tablet Take 0.5 tablets (2 mg total) by mouth as directed.  30 tablet  3    Allergies  Allergen Reactions  . Gabapentin Other (See Comments)    Felt terrible, tremors, etc  . Meperidine Hcl Nausea And Vomiting  . Sulfamethoxazole Swelling    REACTION: unspecified  . Tramadol Hcl Nausea Only    Past Medical History  Diagnosis Date  . Atrial fibrillation     Status post AV junction ablation  . Diastolic heart failure     mild volume overload and increased symptoms of shortness of breath  . DJD (degenerative joint disease) of lumbar spine   . Pacemaker     Original implant 1995, generator replacement 2006  . HTN (hypertension)   . History of recurrent UTIs     Dr. Artis Flock   . Sleep disturbance   . Neuropathy   . Gout   . Depression     Past Surgical History  Procedure Date  . Ventricular ablation surgery     atrioventricular nodal  ablation  . Knee surgery     right  . Appendectomy   . Breast biopsy   . Pulse generator implant     placement of internal pulse generator for permanent spinal cord- stimulartor left hip  . Ventricular cardiac pacemaker insertion     [employer]. [employer] Victory  Pacemaker programmed to ventricular demand pacing  . Insert / replace / remove pacemaker     generator change out 09/29/11    Family History  Problem Relation Age of Onset  . Heart disease Mother     heart failure  . Stroke Father     History   Social History  . Marital Status: Married    Spouse Name: N/A    Number of Children: 2  . Years of Education: N/A   Occupational History  . homemaker    Social History Main Topics  . Smoking status: Former Smoker -- 1.0 packs/day    Types: Cigarettes  . Smokeless tobacco: Never Used     Comment: quit long ago   . Alcohol Use: 0.5 oz/week    1 drink(s) per week  . Drug Use: No  . Sexually Active: Not on file   Other  Topics Concern  . Not on file   Social History Narrative   Son Rosanne Ashing holds health care power of attorney. Does request DNR. No tube feeds in cognitively unaware   Review of Systems Not sleeping well due to the itching Weight up 6#--but not fluid    Objective:   Physical Exam  Constitutional: She appears well-developed and well-nourished. No distress.  Neck: Normal range of motion. Neck supple. No thyromegaly present.  Cardiovascular: Normal rate, regular rhythm and normal heart sounds.  Exam reveals no gallop.   No murmur heard. Pulmonary/Chest: Effort normal and breath sounds normal. No respiratory distress. She has no wheezes. She has no rales.  Abdominal: Soft. There is no tenderness.  Musculoskeletal:       Thick calves without pitting  Lymphadenopathy:    She has no cervical adenopathy.  Skin:       No rash where she is itching across low back  Psychiatric: She has a normal mood and affect. Her behavior is normal.          Assessment & Plan:

## 2012-07-14 NOTE — Assessment & Plan Note (Signed)
BP Readings from Last 3 Encounters:  07/14/12 120/68  04/15/12 118/60  04/06/12 108/70   Good control

## 2012-07-28 ENCOUNTER — Telehealth: Payer: Self-pay | Admitting: *Deleted

## 2012-07-28 ENCOUNTER — Ambulatory Visit (INDEPENDENT_AMBULATORY_CARE_PROVIDER_SITE_OTHER): Payer: Medicare Other

## 2012-07-28 DIAGNOSIS — I4891 Unspecified atrial fibrillation: Secondary | ICD-10-CM

## 2012-07-28 DIAGNOSIS — Z7901 Long term (current) use of anticoagulants: Secondary | ICD-10-CM

## 2012-07-28 LAB — POCT INR: INR: 2.9

## 2012-07-28 NOTE — Telephone Encounter (Signed)
.  left message to have patient return my call.  Received fax from [facility] pharmacy about pt having a itching problem. Pt has rash on scalp, back and legs and nothing is helping, pt has seen dermatology before and pharmacist suggested another visit to dermatology.

## 2012-07-28 NOTE — Telephone Encounter (Signed)
Spoke with patient and she's been itching for over 2 months, started with her scalp now itching on back and legs. Pt going out of town for two weeks, she will call when she comes back.

## 2012-07-29 NOTE — Telephone Encounter (Signed)
Yeah we spoke about it at her recent visit No clear answers

## 2012-08-16 ENCOUNTER — Ambulatory Visit (INDEPENDENT_AMBULATORY_CARE_PROVIDER_SITE_OTHER): Payer: Medicare Other | Admitting: *Deleted

## 2012-08-16 ENCOUNTER — Encounter: Payer: Self-pay | Admitting: Internal Medicine

## 2012-08-16 DIAGNOSIS — Z950 Presence of cardiac pacemaker: Secondary | ICD-10-CM

## 2012-08-16 DIAGNOSIS — I442 Atrioventricular block, complete: Secondary | ICD-10-CM

## 2012-08-17 LAB — REMOTE PACEMAKER DEVICE
BMOD-0002RV: 8
DEVICE MODEL PM: 7280550
VENTRICULAR PACING PM: 98

## 2012-08-24 ENCOUNTER — Encounter: Payer: Self-pay | Admitting: *Deleted

## 2012-08-25 ENCOUNTER — Ambulatory Visit (INDEPENDENT_AMBULATORY_CARE_PROVIDER_SITE_OTHER): Payer: Medicare Other

## 2012-08-25 DIAGNOSIS — I4891 Unspecified atrial fibrillation: Secondary | ICD-10-CM

## 2012-08-25 DIAGNOSIS — Z7901 Long term (current) use of anticoagulants: Secondary | ICD-10-CM

## 2012-08-25 LAB — POCT INR: INR: 3.5

## 2012-09-02 ENCOUNTER — Other Ambulatory Visit: Payer: Self-pay | Admitting: Internal Medicine

## 2012-09-02 NOTE — Telephone Encounter (Signed)
Okay #30 x 1 

## 2012-09-02 NOTE — Telephone Encounter (Signed)
rx called into pharmacy

## 2012-09-20 ENCOUNTER — Other Ambulatory Visit: Payer: Self-pay | Admitting: Internal Medicine

## 2012-09-22 ENCOUNTER — Ambulatory Visit (INDEPENDENT_AMBULATORY_CARE_PROVIDER_SITE_OTHER): Payer: Medicare Other

## 2012-09-22 DIAGNOSIS — I4891 Unspecified atrial fibrillation: Secondary | ICD-10-CM

## 2012-09-22 DIAGNOSIS — Z7901 Long term (current) use of anticoagulants: Secondary | ICD-10-CM

## 2012-09-27 ENCOUNTER — Other Ambulatory Visit: Payer: Self-pay | Admitting: Internal Medicine

## 2012-10-05 ENCOUNTER — Ambulatory Visit (INDEPENDENT_AMBULATORY_CARE_PROVIDER_SITE_OTHER): Payer: Medicare Other | Admitting: Internal Medicine

## 2012-10-05 ENCOUNTER — Encounter: Payer: Self-pay | Admitting: Internal Medicine

## 2012-10-05 VITALS — BP 130/80 | HR 73 | Temp 99.5°F | Resp 20 | Wt 171.0 lb

## 2012-10-05 DIAGNOSIS — I4891 Unspecified atrial fibrillation: Secondary | ICD-10-CM

## 2012-10-05 DIAGNOSIS — J209 Acute bronchitis, unspecified: Secondary | ICD-10-CM | POA: Insufficient documentation

## 2012-10-05 MED ORDER — LEVOFLOXACIN 500 MG PO TABS: 500.0000 mg | ORAL_TABLET | Freq: Every day | ORAL | Status: DC

## 2012-10-05 MED ORDER — PREDNISONE 20 MG PO TABS: 40.0000 mg | ORAL_TABLET | Freq: Every day | ORAL | Status: DC

## 2012-10-05 NOTE — Assessment & Plan Note (Signed)
Due for protime next week Will ask that that be pushed up to Friday since she will be on the levaquin

## 2012-10-05 NOTE — Assessment & Plan Note (Signed)
Has some tight cough and wheezing as well No evidence of pneumonia but will treat aggressively with levaquin and prednisone

## 2012-10-05 NOTE — Progress Notes (Signed)
Subjective:    Patient ID: Carrie [name], female    DOB: Aug 26, 1920, 79 y.o.   MRN: [id number]  HPI She has been sick for a few days Now feeling worse Now with worse cough--- has dark sputum now Some fever-- freezing and up to 99.7.  Has had some night sweats --last night esp. Some shakes also Does feel SOB with any walking---like to the bathroom  No sig sore throat Some head congestion and drainage--clear rhinorrhea No ear pain  Used tylenol yesterday--no other Rx  Current Outpatient Prescriptions on File Prior to Visit  Medication Sig Dispense Refill  . allopurinol (ZYLOPRIM) 100 MG tablet TAKE ONE OR TWO TABLETS DAILY  60 tablet  11  . benazepril (LOTENSIN) 20 MG tablet Take 1 tablet (20 mg total) by mouth daily.  30 tablet  6  . cholecalciferol (VITAMIN D) 1000 UNITS tablet Take 1,000 Units by mouth daily.        . clonazePAM (KLONOPIN) 0.5 MG tablet TAKE ONE-HALF TO ONE TABLET AT BEDTIME  AS NEEDED  30 tablet  1  . COLCRYS 0.6 MG tablet TAKE 1 TABLET TWICE A DAY AS NEEDED     FOR GOUT PAIN  60 tablet  11  . CONSTULOSE 10 GM/15ML solution TAKE 15 TO 30 ML BY MOUTH TWICE DAILY ASNEEDED FOR CONSTIPATION  946 mL  0  . furosemide (LASIX) 80 MG tablet TAKE ONE TABLET EVERY MORNING HOLD IF   YOUR WEIGHT AT  HOME IS [id number] POUNDS  30 tablet  11  . ketoconazole (NIZORAL) 2 % shampoo       . metolazone (ZAROXOLYN) 2.5 MG tablet Take 1 tablet (2.5 mg total) by mouth daily as needed.  30 tablet  6  . metoprolol tartrate (LOPRESSOR) 25 MG tablet TAKE ONE TABLET TWICE A DAY  60 tablet  11  . mirtazapine (REMERON) 15 MG tablet TAKE ONE TABLET AT BEDTIME  30 tablet  11  . potassium chloride SA (K-DUR,KLOR-CON) 20 MEQ tablet Take 1 tablet (20 mEq total) by mouth daily.  30 tablet  3  . warfarin (COUMADIN) 4 MG tablet Take 0.5 tablets (2 mg total) by mouth as directed.  30 tablet  3    Allergies  Allergen Reactions  . Gabapentin Other (See Comments)    Felt terrible, tremors, etc  .  Meperidine Hcl Nausea And Vomiting  . Sulfamethoxazole Swelling    REACTION: unspecified  . Tramadol Hcl Nausea Only    Past Medical History  Diagnosis Date  . Atrial fibrillation     Status post AV junction ablation  . Diastolic heart failure     mild volume overload and increased symptoms of shortness of breath  . DJD (degenerative joint disease) of lumbar spine   . Pacemaker     Original implant 1995, generator replacement 2006  . HTN (hypertension)   . History of recurrent UTIs     Dr. Artis Flock   . Sleep disturbance   . Neuropathy   . Gout   . Depression     Past Surgical History  Procedure Date  . Ventricular ablation surgery     atrioventricular nodal ablation  . Knee surgery     right  . Appendectomy   . Breast biopsy   . Pulse generator implant     placement of internal pulse generator for permanent spinal cord- stimulartor left hip  . Ventricular cardiac pacemaker insertion     St. Jude Victory  Pacemaker programmed to ventricular demand  pacing  . Insert / replace / remove pacemaker     generator change out 09/29/11    Family History  Problem Relation Age of Onset  . Heart disease Mother     heart failure  . Stroke Father     History   Social History  . Marital Status: Married    Spouse Name: N/A    Number of Children: 2  . Years of Education: N/A   Occupational History  . homemaker    Social History Main Topics  . Smoking status: Former Smoker -- 1.0 packs/day    Types: Cigarettes  . Smokeless tobacco: Never Used     Comment: quit long ago   . Alcohol Use: 0.5 oz/week    1 drink(s) per week  . Drug Use: No  . Sexually Active: Not on file   Other Topics Concern  . Not on file   Social History Narrative   Son Rosanne Ashing holds health care power of attorney. Does request DNR. No tube feeds in cognitively unaware   Review of Systems Remains on same diuretic regimen Weight stable No rash No vomiting or diarrhea Appetite is really off      Objective:   Physical Exam  Constitutional: She appears well-developed and well-nourished.       Mildly uncomfortable Audible exp wheeze and some tight cough  HENT:  Mouth/Throat: Oropharynx is clear and moist. No oropharyngeal exudate.       No sinus tenderness TMs normal Mild nasal inflammation ?polyp in right nare  Neck: Normal range of motion. Neck supple.  Pulmonary/Chest: Effort normal. No respiratory distress. She has wheezes. She has no rales.       No dullness to percussion No auscultatory wheezes except with cough  Musculoskeletal: She exhibits no edema.  Lymphadenopathy:    She has no cervical adenopathy.          Assessment & Plan:

## 2012-10-08 ENCOUNTER — Ambulatory Visit (INDEPENDENT_AMBULATORY_CARE_PROVIDER_SITE_OTHER): Payer: Medicare Other

## 2012-10-08 ENCOUNTER — Other Ambulatory Visit: Payer: Self-pay | Admitting: Family Medicine

## 2012-10-08 DIAGNOSIS — Z7901 Long term (current) use of anticoagulants: Secondary | ICD-10-CM

## 2012-10-08 DIAGNOSIS — I4891 Unspecified atrial fibrillation: Secondary | ICD-10-CM

## 2012-10-08 MED ORDER — HYDROCODONE-HOMATROPINE 5-1.5 MG/5ML PO SYRP: ORAL_SOLUTION | ORAL | Status: DC

## 2012-10-08 NOTE — Telephone Encounter (Signed)
rx called into pharmacy Spoke with daughter and advised results.  

## 2012-10-08 NOTE — Telephone Encounter (Signed)
Okay to refill but I recommend hycodan Much cheaper #60cc 1 teaspoon bid prn for severe cough

## 2012-10-08 NOTE — Telephone Encounter (Signed)
Pt's daughter called to see if her rx refill for Tussionex had been sent to pharmacy.  I do not see any documentation of this medication in her chart.  She is a [employer].  Can you help me with this?

## 2012-10-08 NOTE — Telephone Encounter (Signed)
Received faxed request from [facility] pharmacy stating that pt was seen on Tuesday and thought she had enough cough syrup when she got home. Requesting HYDROCODONE/CPM 10-8 MG/5 ML SUS ML #60 TAKE 1 TEASPOONFUL EVERY 12 HOURS AS NEEDED FOR COUGH. Original rx was 01/17/2010, please advise.

## 2012-10-13 ENCOUNTER — Ambulatory Visit (INDEPENDENT_AMBULATORY_CARE_PROVIDER_SITE_OTHER): Payer: Medicare Other | Admitting: Cardiovascular Disease

## 2012-10-13 ENCOUNTER — Ambulatory Visit (INDEPENDENT_AMBULATORY_CARE_PROVIDER_SITE_OTHER): Payer: Medicare Other

## 2012-10-13 ENCOUNTER — Encounter: Payer: Self-pay | Admitting: Cardiovascular Disease

## 2012-10-13 VITALS — BP 110/70 | HR 74 | Ht 64.0 in | Wt 171.5 lb

## 2012-10-13 DIAGNOSIS — I10 Essential (primary) hypertension: Secondary | ICD-10-CM

## 2012-10-13 DIAGNOSIS — I442 Atrioventricular block, complete: Secondary | ICD-10-CM

## 2012-10-13 DIAGNOSIS — I4891 Unspecified atrial fibrillation: Secondary | ICD-10-CM

## 2012-10-13 DIAGNOSIS — J209 Acute bronchitis, unspecified: Secondary | ICD-10-CM

## 2012-10-13 DIAGNOSIS — Z7901 Long term (current) use of anticoagulants: Secondary | ICD-10-CM

## 2012-10-13 DIAGNOSIS — I5032 Chronic diastolic (congestive) heart failure: Secondary | ICD-10-CM

## 2012-10-13 NOTE — Assessment & Plan Note (Signed)
Tolerating Coumadin without complications.

## 2012-10-13 NOTE — Patient Instructions (Addendum)
You are doing well. No medication changes were made.  Please call us if you have new issues that need to be addressed before your next appt.  Your physician wants you to follow-up in: 6 months.  You will receive a reminder letter in the mail two months in advance. If you don't receive a letter, please call our office to schedule the follow-up appointment.   

## 2012-10-13 NOTE — Assessment & Plan Note (Signed)
Still with malaise, cough, general symptoms concerning for persistent viral infection. Lungs are clear on exam.

## 2012-10-13 NOTE — Assessment & Plan Note (Signed)
Weight is up slightly but she reports eating more. No clinical signs of heart failure. Certainly no edema and difficult to evaluate lung fields that she has underlying viral infection. Currently on antibiotics.

## 2012-10-13 NOTE — Assessment & Plan Note (Signed)
Followed by Dr. Graciela Husbands for her pacemaker

## 2012-10-13 NOTE — Progress Notes (Signed)
Patient ID: Carrie [name], female    DOB: [birth year], 79 y.o.   MRN: [id number]  HPI Comments: 79 year old white female patient with a h/o diastolic heart failure,  pneumonia 01/2010, severe pulmonary hypertension on echo,  long history of back problems requiring surgery,  chronic atrial fibrillation with AV node ablation, placement of pacemaker, who presents for followup.   She reports that she does not feel well for at least the past week. She was seen by Dr. Burnett Sheng and started on prednisone and Levaquin for worsening upper respiratory infection. She continues to have significant malaise and fatigue with body aches, sore throat, cough and congestion. She is frustrated that she's not getting better faster.  She has been eating well otherwise, drinking lots of fluids. Recent weight gain from eating and cooking. She does not think she has fluid retention. She closely watch her weight on a daily basis. No edema, no PND or orthopnea  Pacemaker change out  by Dr. Graciela Husbands with no problems.   EKG showing ventricular paced rhythm, rate 74 beats per minute    Outpatient Encounter Prescriptions as of 10/13/2012  Medication Sig Dispense Refill  . allopurinol (ZYLOPRIM) 100 MG tablet TAKE ONE OR TWO TABLETS DAILY  60 tablet  11  . benazepril (LOTENSIN) 20 MG tablet Take 1 tablet (20 mg total) by mouth daily.  30 tablet  6  . cholecalciferol (VITAMIN D) 1000 UNITS tablet Take 1,000 Units by mouth daily.        . clonazePAM (KLONOPIN) 0.5 MG tablet TAKE ONE-HALF TO ONE TABLET AT BEDTIME  AS NEEDED  30 tablet  1  . COLCRYS 0.6 MG tablet TAKE 1 TABLET TWICE A DAY AS NEEDED     FOR GOUT PAIN  60 tablet  11  . CONSTULOSE 10 GM/15ML solution TAKE 15 TO 30 ML BY MOUTH TWICE DAILY ASNEEDED FOR CONSTIPATION  946 mL  0  . furosemide (LASIX) 80 MG tablet TAKE ONE TABLET EVERY MORNING HOLD IF   YOUR WEIGHT AT  HOME IS [id number] POUNDS  30 tablet  11  . HYDROcodone-homatropine (HYCODAN) 5-1.5 MG/5ML syrup 1 teaspoon by  mouth twice daily as needed for severe cough  60 mL  0  . ketoconazole (NIZORAL) 2 % shampoo       . levofloxacin (LEVAQUIN) 500 MG tablet Take 1 tablet (500 mg total) by mouth daily.  10 tablet  0  . metolazone (ZAROXOLYN) 2.5 MG tablet Take 1 tablet (2.5 mg total) by mouth daily as needed.  30 tablet  6  . metoprolol tartrate (LOPRESSOR) 25 MG tablet TAKE ONE TABLET TWICE A DAY  60 tablet  11  . mirtazapine (REMERON) 15 MG tablet TAKE ONE TABLET AT BEDTIME  30 tablet  11  . potassium chloride SA (K-DUR,KLOR-CON) 20 MEQ tablet Take 1 tablet (20 mEq total) by mouth daily.  30 tablet  3  . predniSONE (DELTASONE) 20 MG tablet Take 2 tablets (40 mg total) by mouth daily.  10 tablet  0  . warfarin (COUMADIN) 4 MG tablet Take 0.5 tablets (2 mg total) by mouth as directed.  30 tablet  3    Review of Systems  Constitutional: Positive for activity change and fatigue.  HENT: Negative.   Eyes: Negative.   Respiratory: Positive for cough.   Cardiovascular: Negative.   Gastrointestinal: Negative.   Musculoskeletal: Positive for back pain, joint swelling and arthralgias.  Skin: Negative.   Neurological: Positive for weakness.  Hematological: Negative.   All  other systems reviewed and are negative.    BP 110/70  Pulse 74  Ht 5\' 4"  (1.626 m)  Wt 171 lb 8 oz (77.792 kg)  BMI 29.44 kg/m2  Physical Exam  Nursing note and vitals reviewed. Constitutional: She is oriented to person, place, and time. She appears well-developed and well-nourished.  HENT:  Head: Normocephalic.  Nose: Nose normal.  Mouth/Throat: Oropharynx is clear and moist.  Eyes: Conjunctivae normal are normal. Pupils are equal, round, and reactive to light.  Neck: Normal range of motion. Neck supple. No JVD present.  Cardiovascular: Normal rate, regular rhythm, S1 normal, S2 normal and intact distal pulses.  Exam reveals no gallop and no friction rub.   Murmur heard.  Crescendo systolic murmur is present with a grade of 2/6   Pulmonary/Chest: Effort normal and breath sounds normal. No respiratory distress. She has no wheezes. She has no rales. She exhibits no tenderness.  Abdominal: Soft. Bowel sounds are normal. She exhibits no distension. There is no tenderness.  Musculoskeletal: Normal range of motion. She exhibits no edema and no tenderness.  Lymphadenopathy:    She has no cervical adenopathy.  Neurological: She is alert and oriented to person, place, and time. Coordination normal.  Skin: Skin is warm and dry. No rash noted. No erythema.  Psychiatric: She has a normal mood and affect. Her behavior is normal. Judgment and thought content normal.         Assessment and Plan

## 2012-10-13 NOTE — Assessment & Plan Note (Signed)
Blood pressure is well controlled on today's visit. No changes made to the medications. 

## 2012-10-19 ENCOUNTER — Encounter: Payer: Medicare Other | Admitting: Internal Medicine

## 2012-10-26 ENCOUNTER — Ambulatory Visit (INDEPENDENT_AMBULATORY_CARE_PROVIDER_SITE_OTHER): Payer: Medicare Other | Admitting: Internal Medicine

## 2012-10-26 ENCOUNTER — Encounter: Payer: Self-pay | Admitting: Internal Medicine

## 2012-10-26 VITALS — BP 124/79 | HR 72 | Ht 64.0 in | Wt 172.5 lb

## 2012-10-26 DIAGNOSIS — I5032 Chronic diastolic (congestive) heart failure: Secondary | ICD-10-CM

## 2012-10-26 DIAGNOSIS — I10 Essential (primary) hypertension: Secondary | ICD-10-CM

## 2012-10-26 DIAGNOSIS — Z950 Presence of cardiac pacemaker: Secondary | ICD-10-CM

## 2012-10-26 DIAGNOSIS — I442 Atrioventricular block, complete: Secondary | ICD-10-CM

## 2012-10-26 DIAGNOSIS — I4891 Unspecified atrial fibrillation: Secondary | ICD-10-CM

## 2012-10-26 LAB — PACEMAKER DEVICE OBSERVATION
BATTERY VOLTAGE: 2.9779 V
RV LEAD IMPEDENCE PM: 700 Ohm
VENTRICULAR PACING PM: 98

## 2012-10-26 NOTE — Progress Notes (Signed)
HPI  Carrie [name] is a 79 y.o. female Seen in follow up for afib, chronic, S/p AV ablation and pacing. She has stable chronic shortness of breath.  She underwent device generator replacement 11/13  ;she is feeling better  Her bronchitis of hte last couple of months is finally better   Past Medical History  Diagnosis Date  . Atrial fibrillation     Status post AV junction ablation  . Diastolic heart failure     mild volume overload and increased symptoms of shortness of breath  . DJD (degenerative joint disease) of lumbar spine   . Pacemaker     Original implant 1995, generator replacement 2006  . HTN (hypertension)   . History of recurrent UTIs     Dr. Artis Flock   . Sleep disturbance   . Neuropathy   . Gout   . Depression   . Bronchitis     Past Surgical History  Procedure Laterality Date  . Ventricular ablation surgery      atrioventricular nodal ablation  . Knee surgery      right  . Appendectomy    . Breast biopsy    . Pulse generator implant      placement of internal pulse generator for permanent spinal cord- stimulartor left hip  . Ventricular cardiac pacemaker insertion      St. Jude Victory  Pacemaker programmed to ventricular demand pacing  . Insert / replace / remove pacemaker      generator change out 09/29/11    Current Outpatient Prescriptions  Medication Sig Dispense Refill  . allopurinol (ZYLOPRIM) 100 MG tablet TAKE ONE OR TWO TABLETS DAILY  60 tablet  11  . benazepril (LOTENSIN) 20 MG tablet Take 1 tablet (20 mg total) by mouth daily.  30 tablet  6  . cholecalciferol (VITAMIN D) 1000 UNITS tablet Take 1,000 Units by mouth daily.        . clonazePAM (KLONOPIN) 0.5 MG tablet TAKE ONE-HALF TO ONE TABLET AT BEDTIME  AS NEEDED  30 tablet  1  . COLCRYS 0.6 MG tablet TAKE 1 TABLET TWICE A DAY AS NEEDED     FOR GOUT PAIN  60 tablet  11  . CONSTULOSE 10 GM/15ML solution TAKE 15 TO 30 ML BY MOUTH TWICE DAILY ASNEEDED FOR CONSTIPATION  946 mL  0  .  furosemide (LASIX) 80 MG tablet TAKE ONE TABLET EVERY MORNING HOLD IF   YOUR WEIGHT AT  HOME IS [id number] POUNDS  30 tablet  11  . HYDROcodone-homatropine (HYCODAN) 5-1.5 MG/5ML syrup 1 teaspoon by mouth twice daily as needed for severe cough  60 mL  0  . ketoconazole (NIZORAL) 2 % shampoo       . levofloxacin (LEVAQUIN) 500 MG tablet Take 1 tablet (500 mg total) by mouth daily.  10 tablet  0  . metolazone (ZAROXOLYN) 2.5 MG tablet Take 1 tablet (2.5 mg total) by mouth daily as needed.  30 tablet  6  . metoprolol tartrate (LOPRESSOR) 25 MG tablet TAKE ONE TABLET TWICE A DAY  60 tablet  11  . mirtazapine (REMERON) 15 MG tablet TAKE ONE TABLET AT BEDTIME  30 tablet  11  . potassium chloride SA (K-DUR,KLOR-CON) 20 MEQ tablet Take 1 tablet (20 mEq total) by mouth daily.  30 tablet  3  . predniSONE (DELTASONE) 20 MG tablet Take 2 tablets (40 mg total) by mouth daily.  10 tablet  0  . warfarin (COUMADIN) 4 MG tablet Take 0.5 tablets (2  mg total) by mouth as directed.  30 tablet  3   No current facility-administered medications for this visit.    Allergies  Allergen Reactions  . Gabapentin Other (See Comments)    Felt terrible, tremors, etc  . Meperidine Hcl Nausea And Vomiting  . Sulfamethoxazole Swelling    REACTION: unspecified  . Tramadol Hcl Nausea Only    Review of Systems negative except from HPI and PMH  Physical Exam BP 124/79  Pulse 72  Ht 5\' 4"  (1.626 m)  Wt 172 lb 8 oz (78.245 kg)  BMI 29.59 kg/m2 Well developed and well nourished in no acute distress HENT normal E scleral and icterus clear Neck Supple Clear to ausculation regular rate and rhythm, no murmurs gallops or rub No clubbing cyanosis Trace Edema Alert and oriented, grossly normal motor and sensory function Skin Warm and Dry with venous insufficiency changes   Assessment and  Plan

## 2012-10-26 NOTE — Assessment & Plan Note (Signed)
stbale post pacing

## 2012-10-26 NOTE — Patient Instructions (Addendum)
Your physician wants you to follow-up in: 6 months with device clinic and 1 year with Dr. Klein. You will receive a reminder letter in the mail two months in advance. If you don't receive a letter, please call our office to schedule the follow-up appointment.  

## 2012-10-26 NOTE — Assessment & Plan Note (Signed)
The patient's device was interrogated.  The information was reviewed. No changes were made in the programming.    

## 2012-10-26 NOTE — Assessment & Plan Note (Signed)
Well controlled 

## 2012-10-26 NOTE — Assessment & Plan Note (Signed)
Euvolemic continue current meds   

## 2012-10-26 NOTE — Assessment & Plan Note (Signed)
Permanent on anticoagulation 

## 2012-10-27 ENCOUNTER — Ambulatory Visit (INDEPENDENT_AMBULATORY_CARE_PROVIDER_SITE_OTHER): Payer: Medicare Other

## 2012-10-27 DIAGNOSIS — Z7901 Long term (current) use of anticoagulants: Secondary | ICD-10-CM

## 2012-10-27 DIAGNOSIS — I4891 Unspecified atrial fibrillation: Secondary | ICD-10-CM

## 2012-11-02 ENCOUNTER — Other Ambulatory Visit: Payer: Self-pay | Admitting: Internal Medicine

## 2012-11-02 NOTE — Telephone Encounter (Signed)
Okay to refill for a year 

## 2012-11-02 NOTE — Telephone Encounter (Signed)
rx sent to pharmacy by e-script  

## 2012-11-03 ENCOUNTER — Other Ambulatory Visit: Payer: Self-pay | Admitting: *Deleted

## 2012-11-03 MED ORDER — CLONAZEPAM 0.5 MG PO TABS: ORAL_TABLET | ORAL | Status: DC

## 2012-11-03 NOTE — Telephone Encounter (Signed)
rx called into pharmacy

## 2012-11-03 NOTE — Telephone Encounter (Signed)
Last fill 10/01/12

## 2012-11-03 NOTE — Telephone Encounter (Signed)
Okay #30 x 1 

## 2012-11-14 ENCOUNTER — Other Ambulatory Visit: Payer: Self-pay | Admitting: Internal Medicine

## 2012-11-15 ENCOUNTER — Other Ambulatory Visit: Payer: Self-pay | Admitting: *Deleted

## 2012-11-15 MED ORDER — POTASSIUM CHLORIDE CRYS ER 20 MEQ PO TBCR: 20.0000 meq | EXTENDED_RELEASE_TABLET | Freq: Every day | ORAL | Status: DC

## 2012-11-15 NOTE — Telephone Encounter (Signed)
Refilled Klor Con #30 Refill#3 sent to [facility].

## 2012-11-17 ENCOUNTER — Ambulatory Visit (INDEPENDENT_AMBULATORY_CARE_PROVIDER_SITE_OTHER): Payer: Medicare Other

## 2012-11-17 DIAGNOSIS — Z7901 Long term (current) use of anticoagulants: Secondary | ICD-10-CM

## 2012-11-17 LAB — POCT INR: INR: 1.8

## 2012-12-08 ENCOUNTER — Ambulatory Visit (INDEPENDENT_AMBULATORY_CARE_PROVIDER_SITE_OTHER): Payer: Medicare Other

## 2012-12-08 DIAGNOSIS — I4891 Unspecified atrial fibrillation: Secondary | ICD-10-CM

## 2012-12-08 DIAGNOSIS — Z7901 Long term (current) use of anticoagulants: Secondary | ICD-10-CM

## 2012-12-13 ENCOUNTER — Other Ambulatory Visit: Payer: Self-pay | Admitting: Internal Medicine

## 2012-12-27 ENCOUNTER — Other Ambulatory Visit: Payer: Self-pay | Admitting: *Deleted

## 2012-12-27 ENCOUNTER — Encounter: Payer: Self-pay | Admitting: *Deleted

## 2012-12-27 MED ORDER — BENAZEPRIL HCL 20 MG PO TABS: 20.0000 mg | ORAL_TABLET | Freq: Every day | ORAL | Status: DC

## 2012-12-27 NOTE — Telephone Encounter (Signed)
Refilled Benazepril.

## 2012-12-29 ENCOUNTER — Ambulatory Visit (INDEPENDENT_AMBULATORY_CARE_PROVIDER_SITE_OTHER): Payer: Medicare Other

## 2012-12-29 DIAGNOSIS — Z7901 Long term (current) use of anticoagulants: Secondary | ICD-10-CM

## 2012-12-29 DIAGNOSIS — I4891 Unspecified atrial fibrillation: Secondary | ICD-10-CM

## 2012-12-29 LAB — POCT INR: INR: 2.3

## 2013-01-06 ENCOUNTER — Other Ambulatory Visit: Payer: Self-pay | Admitting: *Deleted

## 2013-01-06 MED ORDER — CLONAZEPAM 0.5 MG PO TABS: ORAL_TABLET | ORAL | Status: DC

## 2013-01-06 NOTE — Telephone Encounter (Signed)
rx called into pharmacy

## 2013-01-06 NOTE — Telephone Encounter (Signed)
Okay #30 x 1 

## 2013-01-06 NOTE — Telephone Encounter (Signed)
Last filled 12/01/12

## 2013-01-11 ENCOUNTER — Ambulatory Visit: Payer: Medicare Other | Admitting: Internal Medicine

## 2013-01-17 ENCOUNTER — Encounter: Payer: Self-pay | Admitting: Internal Medicine

## 2013-01-17 ENCOUNTER — Ambulatory Visit (INDEPENDENT_AMBULATORY_CARE_PROVIDER_SITE_OTHER): Payer: Medicare Other | Admitting: Internal Medicine

## 2013-01-17 VITALS — BP 110/60 | HR 71 | Temp 98.2°F | Wt 172.0 lb

## 2013-01-17 DIAGNOSIS — I5032 Chronic diastolic (congestive) heart failure: Secondary | ICD-10-CM

## 2013-01-17 DIAGNOSIS — I4891 Unspecified atrial fibrillation: Secondary | ICD-10-CM

## 2013-01-17 DIAGNOSIS — I10 Essential (primary) hypertension: Secondary | ICD-10-CM

## 2013-01-17 DIAGNOSIS — F329 Major depressive disorder, single episode, unspecified: Secondary | ICD-10-CM

## 2013-01-17 DIAGNOSIS — M109 Gout, unspecified: Secondary | ICD-10-CM

## 2013-01-17 LAB — CBC WITH DIFFERENTIAL/PLATELET
Basophils Absolute: 0.1 10*3/uL (ref 0.0–0.1)
Eosinophils Absolute: 0.4 10*3/uL (ref 0.0–0.7)
MCHC: 32.8 g/dL (ref 30.0–36.0)
MCV: 93.5 fl (ref 78.0–100.0)
Monocytes Absolute: 0.6 10*3/uL (ref 0.1–1.0)
Neutrophils Relative %: 66.6 % (ref 43.0–77.0)
Platelets: 163 10*3/uL (ref 150.0–400.0)
WBC: 7.3 10*3/uL (ref 4.5–10.5)

## 2013-01-17 LAB — BASIC METABOLIC PANEL
BUN: 31 mg/dL — ABNORMAL HIGH (ref 6–23)
CO2: 28 mEq/L (ref 19–32)
Chloride: 96 mEq/L (ref 96–112)
Creatinine, Ser: 1.3 mg/dL — ABNORMAL HIGH (ref 0.4–1.2)

## 2013-01-17 LAB — HEPATIC FUNCTION PANEL
ALT: 9 U/L (ref 0–35)
Albumin: 4.1 g/dL (ref 3.5–5.2)
Alkaline Phosphatase: 71 U/L (ref 39–117)
Total Protein: 6.9 g/dL (ref 6.0–8.3)

## 2013-01-17 NOTE — Assessment & Plan Note (Signed)
Paced Continues on the coumadin 

## 2013-01-17 NOTE — Assessment & Plan Note (Signed)
Mood generally okay No changes needed

## 2013-01-17 NOTE — Assessment & Plan Note (Signed)
Did have recent exacerbation requiring colchicine Will increase allopurinol if uric acid over 6

## 2013-01-17 NOTE — Assessment & Plan Note (Signed)
Stable fluid status and stamina Really needs to get involved with strength and balance training

## 2013-01-17 NOTE — Progress Notes (Signed)
Subjective:    Patient ID: Carrie [name], female    DOB: [birth year], 79 y.o.   MRN: [id number]  HPI Doing fine  No new concerns but still concerned with her bowels Having trouble---has to use lactulose every morning (15cc) Afraid she could get "blocked bowels" Not sure if she really responded to miralax May spend over an hour sitting on cammode---discussed improved regimen  No chest pain No palpitations  Still does housework and shops, etc. No set exercise but her stamina is stable Some edema---did use the zaroxlyn 2 days ago due to this (uses twice a month or so)  Gets frustrated at times Wonders why she is still here when husband and others are gone Gets "pity party"---but generally not over 1-2 days Still enjoys bridge and social interactions  No gout problems  Still on meds  Current Outpatient Prescriptions on File Prior to Visit  Medication Sig Dispense Refill  . allopurinol (ZYLOPRIM) 100 MG tablet TAKE ONE OR TWO TABLETS DAILY  60 tablet  11  . benazepril (LOTENSIN) 20 MG tablet Take 1 tablet (20 mg total) by mouth daily.  30 tablet  6  . cholecalciferol (VITAMIN D) 1000 UNITS tablet Take 1,000 Units by mouth daily.        . clonazePAM (KLONOPIN) 0.5 MG tablet TAKE ONE-HALF TO ONE TABLET AT BEDTIME  AS NEEDED  30 tablet  1  . COLCRYS 0.6 MG tablet TAKE 1 TABLET TWICE A DAY AS NEEDED     FOR GOUT PAIN  60 tablet  11  . furosemide (LASIX) 80 MG tablet TAKE ONE TABLET EVERY MORNING HOLD IF   YOUR WEIGHT AT  HOME IS [id number] POUNDS  30 tablet  11  . ketoconazole (NIZORAL) 2 % shampoo Apply 1 application topically 2 (two) times a week.       . lactulose (CHRONULAC) 10 GM/15ML solution TAKE 15 TO 30 ML BY MOUTH TWICE DAILY ASNEEDED FOR CONSTIPATION  946 mL  3  . metoprolol tartrate (LOPRESSOR) 25 MG tablet TAKE ONE TABLET TWICE A DAY  60 tablet  11  . mirtazapine (REMERON) 15 MG tablet TAKE ONE TABLET AT BEDTIME  30 tablet  11  . potassium chloride SA (K-DUR,KLOR-CON) 20  MEQ tablet Take 1 tablet (20 mEq total) by mouth daily.  30 tablet  3  . warfarin (COUMADIN) 4 MG tablet Take 0.5 tablets (2 mg total) by mouth as directed.  30 tablet  3   No current facility-administered medications on file prior to visit.    Allergies  Allergen Reactions  . Gabapentin Other (See Comments)    Felt terrible, tremors, etc  . Meperidine Hcl Nausea And Vomiting  . Sulfamethoxazole Swelling    REACTION: unspecified  . Tramadol Hcl Nausea Only    Past Medical History  Diagnosis Date  . Atrial fibrillation     Status post AV junction ablation  . Diastolic heart failure     mild volume overload and increased symptoms of shortness of breath  . DJD (degenerative joint disease) of lumbar spine   . Pacemaker [employer]     Original implant [id number], gen  replacement [id number]  . HTN (hypertension)   . History of recurrent UTIs     Dr. Artis Flock   . Sleep disturbance   . Neuropathy   . Gout   . Depression   . Bronchitis   . Complete heart block 07/11/2011    Past Surgical History  Procedure Laterality Date  . Ventricular ablation  surgery      atrioventricular nodal ablation  . Knee surgery      right  . Appendectomy    . Breast biopsy    . Pulse generator implant      placement of internal pulse generator for permanent spinal cord- stimulartor left hip  . Ventricular cardiac pacemaker insertion      St. Jude Victory  Pacemaker programmed to ventricular demand pacing  . Insert / replace / remove pacemaker      generator change out 09/29/11    Family History  Problem Relation Age of Onset  . Heart disease Mother     heart failure  . Stroke Father     History   Social History  . Marital Status: Married    Spouse Name: N/A    Number of Children: 2  . Years of Education: N/A   Occupational History  . homemaker    Social History Main Topics  . Smoking status: Former Smoker -- 1.00 packs/day    Types: Cigarettes  . Smokeless tobacco: Never Used      Comment: quit long ago   . Alcohol Use: 0.5 oz/week    1 drink(s) per week  . Drug Use: No  . Sexually Active: Not on file   Other Topics Concern  . Not on file   Social History Narrative   Son Rosanne Ashing holds health care power of attorney. Does request DNR. No tube feeds in cognitively unaware   Review of Systems Having trouble with the pollen--had been using OTC nasal sprays. Asked her to stop this. Usually sleeps fair---still not great.    Objective:   Physical Exam  Constitutional: She appears well-developed and well-nourished. No distress.  Neck: Normal range of motion. Neck supple.  Cardiovascular: Normal rate, regular rhythm, normal heart sounds and intact distal pulses.  Exam reveals no gallop.   No murmur heard. Pulmonary/Chest: Effort normal and breath sounds normal. No respiratory distress. She has no wheezes. She has no rales.  Musculoskeletal: She exhibits no edema and no tenderness.  Lymphadenopathy:    She has no cervical adenopathy.  Skin:  Venous stasis changes  Psychiatric: She has a normal mood and affect. Her behavior is normal.          Assessment & Plan:

## 2013-01-17 NOTE — Patient Instructions (Addendum)
Either increase the lactulose to 2 tablespoons daily or you can try miralax again (you may need either less than a full capful or not need it every day if you had loose stools with it in past). Please try loratadine 10mg   1-2 daily or cetirizine 10mg  daily for the allergy symptoms. Please speak to Casimiro Needle [employer] about starting some programs for strength and balance.

## 2013-01-17 NOTE — Assessment & Plan Note (Signed)
BP Readings from Last 3 Encounters:  01/17/13 110/60  10/26/12 124/79  10/13/12 110/70   Good control No changes needed

## 2013-01-18 ENCOUNTER — Encounter: Payer: Self-pay | Admitting: Family Medicine

## 2013-01-26 ENCOUNTER — Ambulatory Visit (INDEPENDENT_AMBULATORY_CARE_PROVIDER_SITE_OTHER): Payer: Medicare Other

## 2013-01-26 DIAGNOSIS — I4891 Unspecified atrial fibrillation: Secondary | ICD-10-CM

## 2013-01-26 DIAGNOSIS — Z7901 Long term (current) use of anticoagulants: Secondary | ICD-10-CM

## 2013-02-10 ENCOUNTER — Encounter: Payer: Self-pay | Admitting: Radiology

## 2013-02-10 ENCOUNTER — Ambulatory Visit (INDEPENDENT_AMBULATORY_CARE_PROVIDER_SITE_OTHER): Payer: Medicare Other | Admitting: Family Medicine

## 2013-02-10 ENCOUNTER — Encounter: Payer: Self-pay | Admitting: Family Medicine

## 2013-02-10 VITALS — BP 136/86 | HR 74 | Temp 97.6°F | Wt 168.8 lb

## 2013-02-10 DIAGNOSIS — R300 Dysuria: Secondary | ICD-10-CM

## 2013-02-10 DIAGNOSIS — N390 Urinary tract infection, site not specified: Secondary | ICD-10-CM

## 2013-02-10 LAB — POCT URINALYSIS DIPSTICK
Bilirubin, UA: NEGATIVE
Nitrite, UA: NEGATIVE
Urobilinogen, UA: 0.2

## 2013-02-10 MED ORDER — CIPROFLOXACIN HCL 250 MG PO TABS: 250.0000 mg | ORAL_TABLET | Freq: Two times a day (BID) | ORAL | Status: DC

## 2013-02-10 NOTE — Assessment & Plan Note (Signed)
UA/micro and story consistent with UTI - treat with 3d course cipro. Update if sxs persist. Cut coumadin in 1/2 while on abx.  Has f/u appt on Wednesday. Pt agrees with plan.

## 2013-02-10 NOTE — Progress Notes (Signed)
  Subjective:    Patient ID: Carrie [name], female    DOB: [birth year], 79 y.o.   MRN: [id number]  HPI CC: I may have a urinary tract infection  2-3 d h/o dysuria, frequency, urgency.  Some nausea in am.  Endorses some incomplete emptying sensation.  No fevers/chills, hematuria, abd pain, flank pain, vomiting.  On coumadin.  Next check will be on Wednesday. Last UTI was several years ago. No recent abx use.  Notices retaining fluid recently - more than normal . Takes zaroxolyn prn. As well as lasix 80mg  daily.  Keeps legs elevated, avoids salt.  Good water intake.  Past Medical History  Diagnosis Date  . Atrial fibrillation     Status post AV junction ablation  . Diastolic heart failure     mild volume overload and increased symptoms of shortness of breath  . DJD (degenerative joint disease) of lumbar spine   . Pacemaker [employer]     Original implant [id number], gen  replacement [id number]  . HTN (hypertension)   . History of recurrent UTIs     Dr. Artis Flock   . Sleep disturbance   . Neuropathy   . Gout   . Depression   . Bronchitis   . Complete heart block 07/11/2011     Review of Systems Per HPI    Objective:   Physical Exam  Nursing note and vitals reviewed. Constitutional: She appears well-developed and well-nourished. No distress.  Abdominal: Soft. Normal appearance and bowel sounds are normal. She exhibits no distension and no mass. There is no hepatosplenomegaly. There is no tenderness. There is no rebound, no guarding and no CVA tenderness.  Musculoskeletal: She exhibits edema (1+ pitting).      Assessment & Plan:

## 2013-02-10 NOTE — Patient Instructions (Signed)
You have a urinary tract infection Treat with cipro 250mg  twice daily for 3 days. Please let us know if symptoms persist after treatment. Please cut coumadin dose in half while on antibiotic. Let coumadin clinic know you were placed on cipro.

## 2013-02-11 ENCOUNTER — Ambulatory Visit: Payer: Medicare Other | Admitting: Internal Medicine

## 2013-02-16 ENCOUNTER — Other Ambulatory Visit: Payer: Self-pay | Admitting: *Deleted

## 2013-02-16 ENCOUNTER — Ambulatory Visit (INDEPENDENT_AMBULATORY_CARE_PROVIDER_SITE_OTHER): Payer: Medicare Other

## 2013-02-16 ENCOUNTER — Other Ambulatory Visit: Payer: Self-pay | Admitting: Family Medicine

## 2013-02-16 ENCOUNTER — Telehealth: Payer: Self-pay | Admitting: Family Medicine

## 2013-02-16 DIAGNOSIS — N390 Urinary tract infection, site not specified: Secondary | ICD-10-CM

## 2013-02-16 DIAGNOSIS — I4891 Unspecified atrial fibrillation: Secondary | ICD-10-CM

## 2013-02-16 DIAGNOSIS — Z7901 Long term (current) use of anticoagulants: Secondary | ICD-10-CM

## 2013-02-16 LAB — POCT INR: INR: 2.4

## 2013-02-16 MED ORDER — CEPHALEXIN 500 MG PO CAPS: 500.0000 mg | ORAL_CAPSULE | Freq: Two times a day (BID) | ORAL | Status: DC

## 2013-02-16 NOTE — Telephone Encounter (Signed)
Urine specimen obtained today in office and sent to lab for UA and C&S.  Pt picked up rx of Keflex at pharmacy and will start today.  Called pt and advised Keflex does not interact with Coumadin like her previously rx abx therefor pt was instructed to resume Coumadin 2mg  daily.  Advised pt to keep her 1 week f/u appt in the Coumadin Clinic for recheck.  Pt is aware once UA back if medications need changing to call and notify me in the Coumadin Clinic so Coumadin can be adjusted if necessary.  See anticoag note in EPIC.  Pt verbalizes understanding.

## 2013-02-16 NOTE — Telephone Encounter (Signed)
Patient went to have her protime checked today and told Erica she's still having the burning, frequency and urgency from a UTI.  Erica called and asked if they could do a Urine sample.  Dr.Gutierrez put in the order for the Urinalysis and Urine Culture and said he would send a rx for Cipro to patient's pharmacy.  Alcario Drought was notified and will let patient know.

## 2013-02-16 NOTE — Telephone Encounter (Signed)
I will send culture and treat with 7d keflex course.  Will route to coumadin clinic to be aware.

## 2013-02-17 ENCOUNTER — Other Ambulatory Visit: Payer: Self-pay

## 2013-02-17 MED ORDER — WARFARIN SODIUM 4 MG PO TABS: 2.0000 mg | ORAL_TABLET | ORAL | Status: DC

## 2013-02-21 ENCOUNTER — Other Ambulatory Visit: Payer: Self-pay

## 2013-02-23 ENCOUNTER — Telehealth: Payer: Self-pay

## 2013-02-23 MED ORDER — NITROFURANTOIN MONOHYD MACRO 100 MG PO CAPS: 100.0000 mg | ORAL_CAPSULE | Freq: Two times a day (BID) | ORAL | Status: DC

## 2013-02-23 NOTE — Telephone Encounter (Signed)
Results were not with Dr. Karis Juba with [facility]. Results were supposed to be faxed to you last Friday by [facility] when she spoke to them. She was going to call them and have them sent again and would call me back. If I don't get them before you leave, I will have Dr. D look at them and advise if that's ok?

## 2013-02-23 NOTE — Telephone Encounter (Addendum)
I just received UCx results.  Have asked to scan into chart. plz notify urine culture returned growing 50-100k Ecoli resistant to keflex and cipro - both antibiotics she was on. I want her to take nitrofurantoin 100mg  twice daily for 7 days - this medicine was sent in.  To call us if sxs persist despite treatment. I will also route note to coumadin clinic to be aware pt will now be on 7 d course of macrobid.  Noted next coumadin appt is 03/02/2013.

## 2013-02-23 NOTE — Telephone Encounter (Signed)
Patient notified and will start abx today.

## 2013-02-23 NOTE — Telephone Encounter (Signed)
Pt seen 02/10/13; has taken 2 antibiotics, feels some better than when seen; burning not as intense but still has burning when urinates and urine has bad odor. No fever, abd or back pain now; no blood in urine and no frequency. Pt wants to know if can come in and leave urine specimen rather than making another office visit.Please advise.

## 2013-02-23 NOTE — Telephone Encounter (Signed)
Urine culture obtained 02/16/2013 at coumadin clinic but results never returned - can we follow up on this?   plz check with [facility] to see if this was sent to Dr. Elbert Ewings or check with coumadin clinic to see where this was sent.

## 2013-02-23 NOTE — Telephone Encounter (Signed)
Called spoke with pt advised Nitrofurantoin does not interact with Coumadin, no need for sooner appt or dosage change.  Advised pt to keep scheduled f/u appt in Coumadin Clinic on 03/02/13.  Pt verbalized understanding.

## 2013-03-02 ENCOUNTER — Ambulatory Visit (INDEPENDENT_AMBULATORY_CARE_PROVIDER_SITE_OTHER): Payer: Medicare Other

## 2013-03-02 ENCOUNTER — Encounter: Payer: Self-pay | Admitting: Family Medicine

## 2013-03-02 DIAGNOSIS — I4891 Unspecified atrial fibrillation: Secondary | ICD-10-CM

## 2013-03-02 DIAGNOSIS — Z7901 Long term (current) use of anticoagulants: Secondary | ICD-10-CM

## 2013-03-02 LAB — POCT INR: INR: 1.9

## 2013-03-07 ENCOUNTER — Other Ambulatory Visit: Payer: Self-pay | Admitting: *Deleted

## 2013-03-07 LAB — UA/M W/RFLX CULTURE, ROUTINE
Bilirubin, UA: NEGATIVE
Glucose, UA: NEGATIVE
Ketones, UA: NEGATIVE
Nitrite, UA: NEGATIVE
RBC, UA: NEGATIVE
Specific Gravity, UA: 1.01 (ref 1.005–1.030)
Urobilinogen, Ur: 0.2 mg/dL (ref 0.0–1.9)

## 2013-03-07 LAB — MICROSCOPIC EXAMINATION

## 2013-03-07 LAB — URINE CULTURE, REFLEX

## 2013-03-07 NOTE — Telephone Encounter (Signed)
Received faxed refill request from pharmacy. Last office visit 01/17/13. Is it okay to refill medication?

## 2013-03-08 MED ORDER — CLONAZEPAM 0.5 MG PO TABS: ORAL_TABLET | ORAL | Status: DC

## 2013-03-08 NOTE — Telephone Encounter (Signed)
rx called into pharmacy

## 2013-03-08 NOTE — Telephone Encounter (Signed)
Okay #30 x 1 

## 2013-03-15 ENCOUNTER — Ambulatory Visit (INDEPENDENT_AMBULATORY_CARE_PROVIDER_SITE_OTHER): Payer: Medicare Other | Admitting: Internal Medicine

## 2013-03-15 ENCOUNTER — Ambulatory Visit: Payer: Medicare Other | Admitting: Internal Medicine

## 2013-03-15 ENCOUNTER — Encounter: Payer: Self-pay | Admitting: Internal Medicine

## 2013-03-15 ENCOUNTER — Encounter: Payer: Self-pay | Admitting: *Deleted

## 2013-03-15 VITALS — BP 128/70 | HR 74 | Temp 98.0°F | Wt 174.0 lb

## 2013-03-15 DIAGNOSIS — J069 Acute upper respiratory infection, unspecified: Secondary | ICD-10-CM

## 2013-03-15 DIAGNOSIS — R300 Dysuria: Secondary | ICD-10-CM

## 2013-03-15 DIAGNOSIS — N3090 Cystitis, unspecified without hematuria: Secondary | ICD-10-CM | POA: Insufficient documentation

## 2013-03-15 LAB — POCT URINALYSIS DIPSTICK
Blood, UA: NEGATIVE
Nitrite, UA: NEGATIVE
Spec Grav, UA: 1.015
Urobilinogen, UA: NEGATIVE
pH, UA: 6.5

## 2013-03-15 MED ORDER — FLUTICASONE PROPIONATE 50 MCG/ACT NA SUSP: 2.0000 | Freq: Every day | NASAL | Status: DC

## 2013-03-15 NOTE — Patient Instructions (Addendum)
Please try the cranberry tablets daily again. Set up your protime in about a week to be sure it isn't affecting.  Try the nasal spray. Call if you get worse

## 2013-03-15 NOTE — Assessment & Plan Note (Signed)
Non specific Doesn't seem bacterial  Will try fluticasone for the congestion which is the most bothersome symptom

## 2013-03-15 NOTE — Progress Notes (Signed)
Subjective:    Patient ID: Carrie [name], female    DOB: Jan 07, 1920, 79 y.o.   MRN: [id number]  HPI "I just don't feel good" Ongoing urinary symptoms---burning dysuria and felt raw (and some swelling) No discharge or blood Some better now though Discussed restarting cranberry tablets she was on before (and will recheck protime after starting)  Having discomfort in throat-- but bothers her further down Some cough Goes back 3-4 days Slight fever Some SOB---nothing new or different Some head congestion and rhinorrhea---notes it worse at night Nose feels "completely closed off" at night Some clear rhinorrhea --mostly in AM No ear pain  No meds for this   Current Outpatient Prescriptions on File Prior to Visit  Medication Sig Dispense Refill  . allopurinol (ZYLOPRIM) 100 MG tablet TAKE ONE OR TWO TABLETS DAILY  60 tablet  11  . benazepril (LOTENSIN) 20 MG tablet Take 1 tablet (20 mg total) by mouth daily.  30 tablet  6  . cholecalciferol (VITAMIN D) 1000 UNITS tablet Take 1,000 Units by mouth daily.        . clonazePAM (KLONOPIN) 0.5 MG tablet TAKE ONE-HALF TO ONE TABLET AT BEDTIME  AS NEEDED  30 tablet  1  . COLCRYS 0.6 MG tablet TAKE 1 TABLET TWICE A DAY AS NEEDED     FOR GOUT PAIN  60 tablet  11  . furosemide (LASIX) 80 MG tablet TAKE ONE TABLET EVERY MORNING HOLD IF   YOUR WEIGHT AT  HOME IS [id number] POUNDS  30 tablet  11  . ketoconazole (NIZORAL) 2 % shampoo Apply 1 application topically 2 (two) times a week.       . lactulose (CHRONULAC) 10 GM/15ML solution TAKE 15 TO 30 ML BY MOUTH TWICE DAILY ASNEEDED FOR CONSTIPATION  946 mL  3  . metolazone (ZAROXOLYN) 2.5 MG tablet Take 2.5 mg by mouth daily as needed.      . metoprolol tartrate (LOPRESSOR) 25 MG tablet TAKE ONE TABLET TWICE A DAY  60 tablet  11  . mirtazapine (REMERON) 15 MG tablet TAKE ONE TABLET AT BEDTIME  30 tablet  11  . potassium chloride SA (K-DUR,KLOR-CON) 20 MEQ tablet Take 1 tablet (20 mEq total) by mouth  daily.  30 tablet  3  . warfarin (COUMADIN) 4 MG tablet Take 0.5 tablets (2 mg total) by mouth as directed.  30 tablet  3   No current facility-administered medications on file prior to visit.    Allergies  Allergen Reactions  . Gabapentin Other (See Comments)    Felt terrible, tremors, etc  . Meperidine Hcl Nausea And Vomiting  . Sulfamethoxazole Swelling    REACTION: unspecified  . Tramadol Hcl Nausea Only    Past Medical History  Diagnosis Date  . Atrial fibrillation     Status post AV junction ablation  . Diastolic heart failure     mild volume overload and increased symptoms of shortness of breath  . DJD (degenerative joint disease) of lumbar spine   . Pacemaker [employer]     Original implant [id number], gen  replacement [id number]  . HTN (hypertension)   . History of recurrent UTIs     Dr. Artis Flock   . Sleep disturbance   . Neuropathy   . Gout   . Depression   . Bronchitis   . Complete heart block 07/11/2011    Past Surgical History  Procedure Laterality Date  . Ventricular ablation surgery      atrioventricular nodal ablation  .  Knee surgery      right  . Appendectomy    . Breast biopsy    . Pulse generator implant      placement of internal pulse generator for permanent spinal cord- stimulartor left hip  . Ventricular cardiac pacemaker insertion      St. Jude Victory  Pacemaker programmed to ventricular demand pacing  . Insert / replace / remove pacemaker      generator change out 09/29/11    Family History  Problem Relation Age of Onset  . Heart disease Mother     heart failure  . Stroke Father     History   Social History  . Marital Status: Married    Spouse Name: N/A    Number of Children: 2  . Years of Education: N/A   Occupational History  . homemaker    Social History Main Topics  . Smoking status: Former Smoker -- 1.00 packs/day    Types: Cigarettes  . Smokeless tobacco: Never Used     Comment: quit long ago   . Alcohol Use: 0.5 oz/week     1 drink(s) per week     Comment: occ-wine  . Drug Use: No  . Sexually Active: Not on file   Other Topics Concern  . Not on file   Social History Narrative   Son Rosanne Ashing holds health care power of attorney. Does request DNR. No tube feeds in cognitively unaware   Review of Systems Did need zaroxlyn twice in past month or so for increased fluid No vomiting or diarrhea Appetite okay    Objective:   Physical Exam  Constitutional: She appears well-developed and well-nourished. No distress.  Looks like she doesn't feel great but no distress  HENT:  Mouth/Throat: Oropharynx is clear and moist. No oropharyngeal exudate.  TMs normal No sinus tenderness Moderate nasal inflammation  Neck: Normal range of motion. Neck supple.  Pulmonary/Chest: Effort normal and breath sounds normal. No respiratory distress. She has no wheezes. She has no rales.  Abdominal: Soft. There is no tenderness.  Lymphadenopathy:    She has no cervical adenopathy.          Assessment & Plan:

## 2013-03-15 NOTE — Assessment & Plan Note (Signed)
Recurrent symptoms Culture negative but low growth of multiple resistant E coli Will have her try cranberry again Antibiotic if worsens (cefuroxime)

## 2013-03-17 ENCOUNTER — Other Ambulatory Visit: Payer: Self-pay

## 2013-03-23 ENCOUNTER — Ambulatory Visit (INDEPENDENT_AMBULATORY_CARE_PROVIDER_SITE_OTHER): Payer: Medicare Other | Admitting: *Deleted

## 2013-03-23 DIAGNOSIS — Z7901 Long term (current) use of anticoagulants: Secondary | ICD-10-CM

## 2013-03-23 DIAGNOSIS — I4891 Unspecified atrial fibrillation: Secondary | ICD-10-CM

## 2013-03-30 ENCOUNTER — Encounter: Payer: Self-pay | Admitting: Internal Medicine

## 2013-04-06 ENCOUNTER — Ambulatory Visit (INDEPENDENT_AMBULATORY_CARE_PROVIDER_SITE_OTHER): Payer: Medicare Other | Admitting: *Deleted

## 2013-04-06 DIAGNOSIS — Z7901 Long term (current) use of anticoagulants: Secondary | ICD-10-CM

## 2013-04-06 DIAGNOSIS — I4891 Unspecified atrial fibrillation: Secondary | ICD-10-CM

## 2013-04-06 LAB — POCT INR: INR: 2.3

## 2013-04-08 ENCOUNTER — Other Ambulatory Visit: Payer: Self-pay | Admitting: *Deleted

## 2013-04-08 ENCOUNTER — Telehealth: Payer: Self-pay | Admitting: *Deleted

## 2013-04-08 ENCOUNTER — Other Ambulatory Visit: Payer: Self-pay | Admitting: Internal Medicine

## 2013-04-08 MED ORDER — POTASSIUM CHLORIDE CRYS ER 20 MEQ PO TBCR: 20.0000 meq | EXTENDED_RELEASE_TABLET | Freq: Every day | ORAL | Status: DC

## 2013-04-08 MED ORDER — LACTULOSE 10 GM/15ML PO SOLN: ORAL | Status: DC

## 2013-04-08 NOTE — Telephone Encounter (Signed)
Refilled Potassium Klor con sent to [facility].

## 2013-04-19 ENCOUNTER — Ambulatory Visit (INDEPENDENT_AMBULATORY_CARE_PROVIDER_SITE_OTHER): Payer: Medicare Other | Admitting: Cardiovascular Disease

## 2013-04-19 ENCOUNTER — Encounter: Payer: Self-pay | Admitting: Cardiovascular Disease

## 2013-04-19 VITALS — BP 119/78 | HR 71 | Ht 64.0 in | Wt 175.5 lb

## 2013-04-19 DIAGNOSIS — F329 Major depressive disorder, single episode, unspecified: Secondary | ICD-10-CM

## 2013-04-19 DIAGNOSIS — R0602 Shortness of breath: Secondary | ICD-10-CM

## 2013-04-19 DIAGNOSIS — Z950 Presence of cardiac pacemaker: Secondary | ICD-10-CM

## 2013-04-19 DIAGNOSIS — I5032 Chronic diastolic (congestive) heart failure: Secondary | ICD-10-CM

## 2013-04-19 DIAGNOSIS — R000 Tachycardia, unspecified: Secondary | ICD-10-CM

## 2013-04-19 DIAGNOSIS — I872 Venous insufficiency (chronic) (peripheral): Secondary | ICD-10-CM

## 2013-04-19 MED ORDER — TORSEMIDE 20 MG PO TABS: 40.0000 mg | ORAL_TABLET | Freq: Two times a day (BID) | ORAL | Status: DC

## 2013-04-19 NOTE — Assessment & Plan Note (Signed)
Followed by Dr. Klein 

## 2013-04-19 NOTE — Assessment & Plan Note (Signed)
Encouraged her to wear TED hose, leg elevation.

## 2013-04-19 NOTE — Patient Instructions (Addendum)
Please hold the furosemide/Lasix  Start torsemide 40 mg in the morning If swelling does not improve, your could take extra torsemide 40 mg in the afternoon Monitor your weight Call the office if your weight drops 5 pounds   Start wearing the compression hose  Please call us if you have new issues that need to be addressed before your next appt.  Your physician wants you to follow-up in: 1 month.

## 2013-04-19 NOTE — Assessment & Plan Note (Signed)
Suggested she discuss medication options with Dr. Alphonsus Sias. This seems to be her major complaint today. She has asked [place] to move her visit up with Dr. Alphonsus Sias for further discussion.

## 2013-04-19 NOTE — Assessment & Plan Note (Signed)
No significant shortness of breath but she is frustrated by her leg edema. Weight is up 5 pounds from her prior clinic visit. Encouraged her to wear compression hose. She does drink significant amount of fluids. I'm not convinced that her edema is entirely from diastolic CHF. It has the appearance of venous insufficiency/dependent edema. Encouraged her to wear the compression hose. We will change her Lasix to torsemide 40 mg in the morning. If she has further weight gain, she could take torsemide 40 mg twice a day.

## 2013-04-19 NOTE — Progress Notes (Signed)
Patient ID: Carrie [name], female    DOB: 1920-07-15, 79 y.o.   MRN: [id number]  HPI Comments: 79 year old white female patient with a h/o diastolic heart failure,  pneumonia 01/2010, severe pulmonary hypertension on echo,  long history of back problems requiring surgery,  chronic atrial fibrillation with AV node ablation, placement of pacemaker, who presents for followup.   She reports that she is not feel well today. She is depressed, feels she is unable to get rid of her leg edema. Weight is up 5 pounds from her prior clinic visit. Lasix is not working for her, she is taking extra Zaroxolyn with no urination. She does report swelling in her feet and ankles. She spends most of her day sitting out [place] at [employer] [place].  She does not do any regular exercise. Sounds that she does not do any regular social activities. She is requesting a stronger diuretic pill for leg edema. She denies any significant shortness of breath. She reports that she does drink significant amount of fluids and is worried when she does not urinate on a regular basis, especially first thing in the morning. She's not wearing her compression hose. She's also requesting a pill for depression.   no PND or orthopnea  Pacemaker change out  by Dr. Graciela Husbands with no problems.   EKG showing normal sinus rhythm with intraventricular conduction delay, left anterior fascicular block    Outpatient Encounter Prescriptions as of 04/19/2013  Medication Sig Dispense Refill  . allopurinol (ZYLOPRIM) 100 MG tablet TAKE ONE OR TWO TABLETS DAILY  60 tablet  11  . benazepril (LOTENSIN) 20 MG tablet Take 1 tablet (20 mg total) by mouth daily.  30 tablet  6  . cholecalciferol (VITAMIN D) 1000 UNITS tablet Take 1,000 Units by mouth daily.        . clonazePAM (KLONOPIN) 0.5 MG tablet TAKE ONE-HALF TO ONE TABLET AT BEDTIME  AS NEEDED  30 tablet  1  . COLCRYS 0.6 MG tablet TAKE 1 TABLET TWICE A DAY AS NEEDED      FOR GOUT PAIN  60 tablet  11  . fluticasone (FLONASE) 50 MCG/ACT nasal spray Place 2 sprays into the nose daily. In each nostril  16 g  12  . furosemide (LASIX) 80 MG tablet TAKE ONE TABLET EVERY MORNING HOLD IF   YOUR WEIGHT AT  HOME IS [id number] POUNDS  30 tablet  11  . ketoconazole (NIZORAL) 2 % shampoo Apply 1 application topically 2 (two) times a week.       . lactulose (CHRONULAC) 10 GM/15ML solution TAKE 15 TO (1 TO 2 TABLESPOONFULS) TWICE A DAY AS NEEDED FOR CONSTIPATION  946 mL  0  . metolazone (ZAROXOLYN) 2.5 MG tablet Take 2.5 mg by mouth daily as needed.      . metoprolol tartrate (LOPRESSOR) 25 MG tablet TAKE ONE TABLET TWICE A DAY  60 tablet  11  . mirtazapine (REMERON) 15 MG tablet TAKE ONE TABLET AT BEDTIME  30 tablet  11  . potassium chloride SA (K-DUR,KLOR-CON) 20 MEQ tablet Take 1 tablet (20 mEq total) by mouth daily.  30 tablet  3  . warfarin (COUMADIN) 4 MG tablet Take 0.5 tablets (2 mg total) by mouth as directed.  30 tablet  3    Review of Systems  Constitutional: Positive for activity change and fatigue.  HENT: Negative.   Eyes: Negative.   Cardiovascular: Negative.   Gastrointestinal: Negative.  Musculoskeletal: Positive for back pain, joint swelling and arthralgias.  Skin: Negative.   Neurological: Positive for weakness.  All other systems reviewed and are negative.    BP 119/78  Pulse 71  Ht 5\' 4"  (1.626 m)  Wt 175 lb 8 oz (79.606 kg)  BMI 30.11 kg/m2  Physical Exam  Nursing note and vitals reviewed. Constitutional: She is oriented to person, place, and time. She appears well-developed and well-nourished.  HENT:  Head: Normocephalic.  Nose: Nose normal.  Mouth/Throat: Oropharynx is clear and moist.  Eyes: Conjunctivae are normal. Pupils are equal, round, and reactive to light.  Neck: Normal range of motion. Neck supple. No JVD present.  Cardiovascular: Normal rate, regular rhythm, S1 normal, S2 normal and intact distal pulses.  Exam reveals  no gallop and no friction rub.   Murmur heard.  Crescendo systolic murmur is present with a grade of 2/6  Pulmonary/Chest: Effort normal and breath sounds normal. No respiratory distress. She has no wheezes. She has no rales. She exhibits no tenderness.  Abdominal: Soft. Bowel sounds are normal. She exhibits no distension. There is no tenderness.  Musculoskeletal: Normal range of motion. She exhibits no edema and no tenderness.  Lymphadenopathy:    She has no cervical adenopathy.  Neurological: She is alert and oriented to person, place, and time. Coordination normal.  Skin: Skin is warm and dry. No rash noted. No erythema.  Psychiatric: She has a normal mood and affect. Her behavior is normal. Judgment and thought content normal.    Assessment and Plan

## 2013-04-22 ENCOUNTER — Ambulatory Visit: Payer: Medicare Other | Admitting: Internal Medicine

## 2013-04-25 ENCOUNTER — Ambulatory Visit (INDEPENDENT_AMBULATORY_CARE_PROVIDER_SITE_OTHER): Payer: Medicare Other

## 2013-04-25 ENCOUNTER — Telehealth: Payer: Self-pay

## 2013-04-25 ENCOUNTER — Other Ambulatory Visit: Payer: Self-pay

## 2013-04-25 ENCOUNTER — Ambulatory Visit: Payer: Medicare Other | Admitting: Internal Medicine

## 2013-04-25 ENCOUNTER — Ambulatory Visit (INDEPENDENT_AMBULATORY_CARE_PROVIDER_SITE_OTHER): Payer: Medicare Other | Admitting: *Deleted

## 2013-04-25 DIAGNOSIS — Z7901 Long term (current) use of anticoagulants: Secondary | ICD-10-CM

## 2013-04-25 DIAGNOSIS — I4891 Unspecified atrial fibrillation: Secondary | ICD-10-CM

## 2013-04-25 DIAGNOSIS — I442 Atrioventricular block, complete: Secondary | ICD-10-CM

## 2013-04-25 LAB — PACEMAKER DEVICE OBSERVATION
BMOD-0002RV: 8
BRDY-0004RV: 120 {beats}/min
DEVICE MODEL PM: 7280550
RV LEAD IMPEDENCE PM: 650 Ohm
RV LEAD THRESHOLD: 1 V
VENTRICULAR PACING PM: 97

## 2013-04-25 LAB — POCT INR: INR: 1.7

## 2013-04-25 NOTE — Telephone Encounter (Signed)
Patient c/o shortness of breath, legs, feet and ankles still swelling, she has only lost 1 lb since the change in the fluid pill and is not feeling as good on the new fluid pill Torsemide; she doesn't feel the Torsemide is working as well as the Lasix. She weighed 174.8 pounds today at her pacer check. Patient does not go to the bathroom. Please advise what you would like the patient do?

## 2013-04-25 NOTE — Progress Notes (Signed)
PPM check in office. 

## 2013-04-25 NOTE — Telephone Encounter (Signed)
Would order basic metabolic panel in the [place] office or through [facility]. Suspect she may be dehydrated. Edema is likely from venous insufficiency This is probably why Lasix and torsemide not working for her  Would confirm with BMP If renal function is worse, we'll need to back down on a diuretic and have her wear compression hose

## 2013-04-25 NOTE — Telephone Encounter (Signed)
Pt c/o SOB and feels that she has "at least 5lbs of water weight that she can't get rid of".  She has taken 2 Torsemide today already, has avoided "every form of salt", is wearing her compression stockings, but doesn't feel like she is getting any results from the Torsemide.  Please advise.  Thank you.

## 2013-04-25 NOTE — Telephone Encounter (Signed)
Pt will go to [facility] tomorrow to have blood drawn for BMP.

## 2013-04-26 ENCOUNTER — Encounter: Payer: Self-pay | Admitting: Internal Medicine

## 2013-04-26 ENCOUNTER — Ambulatory Visit (INDEPENDENT_AMBULATORY_CARE_PROVIDER_SITE_OTHER): Payer: Medicare Other | Admitting: Internal Medicine

## 2013-04-26 VITALS — BP 110/60 | HR 73 | Temp 98.3°F | Wt 174.0 lb

## 2013-04-26 DIAGNOSIS — F329 Major depressive disorder, single episode, unspecified: Secondary | ICD-10-CM

## 2013-04-26 NOTE — Progress Notes (Signed)
Subjective:    Patient ID: Carrie [name], female    DOB: 07-04-1920, 79 y.o.   MRN: [id number]  HPI Doesn't feel well "I dread life" Feels "heavy" in her chest Doesn't rest well at night-- so hates going to sleep. Dreads nighttime Despite using mirtazapine and clonazepam  Retaining fluid Working with Dr Mariah Milling on her diuretics---still not working  Awakens at [employer] with completely blocked nose Fluticasone hasn't been helping  Current Outpatient Prescriptions on File Prior to Visit  Medication Sig Dispense Refill  . allopurinol (ZYLOPRIM) 100 MG tablet TAKE ONE OR TWO TABLETS DAILY  60 tablet  11  . benazepril (LOTENSIN) 20 MG tablet Take 1 tablet (20 mg total) by mouth daily.  30 tablet  6  . cholecalciferol (VITAMIN D) 1000 UNITS tablet Take 1,000 Units by mouth daily.        . clonazePAM (KLONOPIN) 0.5 MG tablet TAKE ONE-HALF TO ONE TABLET AT BEDTIME  AS NEEDED  30 tablet  1  . COLCRYS 0.6 MG tablet TAKE 1 TABLET TWICE A DAY AS NEEDED     FOR GOUT PAIN  60 tablet  11  . fluticasone (FLONASE) 50 MCG/ACT nasal spray Place 2 sprays into the nose daily. In each nostril  16 g  12  . lactulose (CHRONULAC) 10 GM/15ML solution TAKE 15 TO (1 TO 2 TABLESPOONFULS) TWICE A DAY AS NEEDED FOR CONSTIPATION  946 mL  0  . metolazone (ZAROXOLYN) 2.5 MG tablet Take 2.5 mg by mouth daily as needed.      . metoprolol tartrate (LOPRESSOR) 25 MG tablet TAKE ONE TABLET TWICE A DAY  60 tablet  11  . mirtazapine (REMERON) 15 MG tablet TAKE ONE TABLET AT BEDTIME  30 tablet  11  . potassium chloride SA (K-DUR,KLOR-CON) 20 MEQ tablet Take 1 tablet (20 mEq total) by mouth daily.  30 tablet  3  . torsemide (DEMADEX) 20 MG tablet Take 2 tablets (40 mg total) by mouth 2 (two) times daily.  120 tablet  4  . warfarin (COUMADIN) 4 MG tablet Take 0.5 tablets (2 mg total) by mouth as directed.  30 tablet  3   No current facility-administered medications on file prior to visit.    Allergies  Allergen Reactions   . Gabapentin Other (See Comments)    Felt terrible, tremors, etc  . Meperidine Hcl Nausea And Vomiting  . Sulfamethoxazole Swelling    REACTION: unspecified  . Tramadol Hcl Nausea Only    Past Medical History  Diagnosis Date  . Atrial fibrillation     Status post AV junction ablation  . Diastolic heart failure     mild volume overload and increased symptoms of shortness of breath  . DJD (degenerative joint disease) of lumbar spine   . Pacemaker [employer]     Original implant [id number], gen  replacement [id number]  . HTN (hypertension)   . History of recurrent UTIs     Dr. Artis Flock   . Sleep disturbance   . Neuropathy   . Gout   . Depression   . Bronchitis   . Complete heart block 07/11/2011    Past Surgical History  Procedure Laterality Date  . Ventricular ablation surgery      atrioventricular nodal ablation  . Knee surgery      right  . Appendectomy    . Breast biopsy    . Pulse generator implant      placement of internal pulse generator for permanent spinal cord- stimulartor  left hip  . Ventricular cardiac pacemaker insertion      St. Jude Victory  Pacemaker programmed to ventricular demand pacing  . Insert / replace / remove pacemaker      generator change out 09/29/11    Family History  Problem Relation Age of Onset  . Heart disease Mother     heart failure  . Stroke Father     History   Social History  . Marital Status: Married    Spouse Name: N/A    Number of Children: 2  . Years of Education: N/A   Occupational History  . homemaker    Social History Main Topics  . Smoking status: Former Smoker -- 1.00 packs/day    Types: Cigarettes  . Smokeless tobacco: Never Used     Comment: quit long ago   . Alcohol Use: 0.5 oz/week    1 drink(s) per week     Comment: occ-wine  . Drug Use: No  . Sexual Activity: Not on file   Other Topics Concern  . Not on file   Social History Narrative   Son Rosanne Ashing holds health care power of attorney. Does request DNR. No  tube feeds in cognitively unaware   Review of Systems Still enjoys cooking and is eating okay Weight stable    Objective:   Physical Exam  Psychiatric:  Depressed Somewhat constricted affect Speech slow but then picks up No delusions "I have lost all my family" Not going out at [place]--due to "back trouble"          Assessment & Plan:

## 2013-04-26 NOTE — Assessment & Plan Note (Signed)
Exacerbated now Not sleeping well PHQ-9 is only 9  She wants to try something to help mood Will stop the mirtazapine Increase clonazepam to 2 at bedtime See back soon---SSRI if not improved

## 2013-04-26 NOTE — Patient Instructions (Signed)
Please stop the mirtazapine. Increase the clonazepam to 2 tabs at bedtime We will decide if another antidepressant is needed at your next visit.

## 2013-04-27 ENCOUNTER — Other Ambulatory Visit: Payer: Self-pay

## 2013-04-27 MED ORDER — CLONAZEPAM 0.5 MG PO TABS: 1.0000 mg | ORAL_TABLET | Freq: Every day | ORAL | Status: DC

## 2013-04-27 NOTE — Telephone Encounter (Signed)
Pt request refill clonazepam to [facility]; pt has enough med to last until 04/29/13.Please advise.

## 2013-04-27 NOTE — Telephone Encounter (Signed)
I increased her dose this week Okay #60 x 0

## 2013-04-27 NOTE — Telephone Encounter (Signed)
rx called into pharmacy

## 2013-04-28 ENCOUNTER — Telehealth: Payer: Self-pay

## 2013-04-28 NOTE — Telephone Encounter (Signed)
Pt called wanting her lab results.  She had a BMP drawn on Tuesday.  States that she is taking 4 torsemide a day, has only lost 2 lbs, and is not peeing.  She has been avoiding water, as she is scared of any more swelling.  Explained that she needs to stay hydrated.  She will wait for [place] to call back with lab results and further instructions.

## 2013-05-03 ENCOUNTER — Other Ambulatory Visit: Payer: Self-pay

## 2013-05-03 DIAGNOSIS — I4891 Unspecified atrial fibrillation: Secondary | ICD-10-CM

## 2013-05-05 ENCOUNTER — Telehealth: Payer: Self-pay | Admitting: *Deleted

## 2013-05-05 NOTE — Telephone Encounter (Signed)
Patient called Dr. Mariah Milling changed her lasix to a stronger Rx and it's not working. She is getting better results with her old lasix. She also wants her bloodwork results. Please advise

## 2013-05-05 NOTE — Telephone Encounter (Signed)
Pt feels she is not responding to the torsemide and would like to be switched back to her Lasix prescription.  Please advise.  Thank you.

## 2013-05-05 NOTE — Telephone Encounter (Signed)
Spoke w/ pt.  She feels that she is not losing any of the swelling.  States that she is wearing her TED hose, keeping her feet elevated, avoiding salt, and "doing everything the doctor told me to do".  She states that she is "just not peeing".  Spoke w/ Alinda Money, PA who reinforced the need for passive ways to remove the swelling from her legs, but she is already doing them.  Pt states that if some of her swelling is not relieved by the weekend, she will switch back over to her previous dose of Lasix.  She would like for Dr. Mariah Milling to look over her labs and "please, please tell her what to do".

## 2013-05-06 NOTE — Telephone Encounter (Signed)
Lab work shows elevated renal function,. She is not urinating much as she is mildly dehydrated.,  Leg edema is not from CHF, Need TEDS, leg elevation for venous insufficiency  Would not worry is she is not urinating.  She has nothing to come out  ("bucket is empty")

## 2013-05-10 ENCOUNTER — Ambulatory Visit: Payer: Medicare Other | Admitting: Internal Medicine

## 2013-05-10 NOTE — Telephone Encounter (Signed)
Spoke w/ pt.  She has lost 6 lbs according to her scales.  She is keeping up with her water intake and feels better.

## 2013-05-11 ENCOUNTER — Encounter: Payer: Self-pay | Admitting: Internal Medicine

## 2013-05-11 ENCOUNTER — Ambulatory Visit (INDEPENDENT_AMBULATORY_CARE_PROVIDER_SITE_OTHER): Payer: Medicare Other | Admitting: Internal Medicine

## 2013-05-11 VITALS — BP 110/70 | HR 82 | Temp 97.8°F | Wt 171.0 lb

## 2013-05-11 DIAGNOSIS — Z23 Encounter for immunization: Secondary | ICD-10-CM

## 2013-05-11 DIAGNOSIS — F329 Major depressive disorder, single episode, unspecified: Secondary | ICD-10-CM

## 2013-05-11 DIAGNOSIS — F3289 Other specified depressive episodes: Secondary | ICD-10-CM

## 2013-05-11 NOTE — Assessment & Plan Note (Signed)
Is better though still some depressed mood Clearly not MDD Sleeping better but with some daytime somnolence--will have her try just 1 clonazepam May have to switch to shorter half life benzo if somnolence persists

## 2013-05-11 NOTE — Progress Notes (Signed)
Subjective:    Patient ID: Carrie [name], female    DOB: [birth year], 79 y.o.   MRN: [id number]  HPI Now enjoys going to bed Doesn't dread it anymore (initiates okay) Still awakens about 2AM after 4 hours of sleep Then stays in bed till 7AM Only had to get up once due to inability to rest (since last visit)  Does feel sleepy in the daytime now Wonders if it is the extra clonazepam  Mood is better but still not great Stays busy --- plays bridge, goes to restaurants on [address] and other, group gatherings (like for her court) Still focuses on "not good things"---- worries (like if daughter misses calling)  Current Outpatient Prescriptions on File Prior to Visit  Medication Sig Dispense Refill  . allopurinol (ZYLOPRIM) 100 MG tablet TAKE ONE OR TWO TABLETS DAILY  60 tablet  11  . benazepril (LOTENSIN) 20 MG tablet Take 1 tablet (20 mg total) by mouth daily.  30 tablet  6  . cholecalciferol (VITAMIN D) 1000 UNITS tablet Take 1,000 Units by mouth daily.        . clonazePAM (KLONOPIN) 0.5 MG tablet Take 2 tablets (1 mg total) by mouth at bedtime.  60 tablet  0  . COLCRYS 0.6 MG tablet TAKE 1 TABLET TWICE A DAY AS NEEDED     FOR GOUT PAIN  60 tablet  11  . fluticasone (FLONASE) 50 MCG/ACT nasal spray Place 2 sprays into the nose daily. In each nostril  16 g  12  . lactulose (CHRONULAC) 10 GM/15ML solution TAKE 15 TO (1 TO 2 TABLESPOONFULS) TWICE A DAY AS NEEDED FOR CONSTIPATION  946 mL  0  . metolazone (ZAROXOLYN) 2.5 MG tablet Take 2.5 mg by mouth daily as needed.      . metoprolol tartrate (LOPRESSOR) 25 MG tablet TAKE ONE TABLET TWICE A DAY  60 tablet  11  . potassium chloride SA (K-DUR,KLOR-CON) 20 MEQ tablet Take 1 tablet (20 mEq total) by mouth daily.  30 tablet  3  . torsemide (DEMADEX) 20 MG tablet Take 2 tablets (40 mg total) by mouth 2 (two) times daily.  120 tablet  4  . warfarin (COUMADIN) 4 MG tablet Take 0.5 tablets (2 mg total) by mouth as directed.  30 tablet  3    No current facility-administered medications on file prior to visit.    Allergies  Allergen Reactions  . Gabapentin Other (See Comments)    Felt terrible, tremors, etc  . Meperidine Hcl Nausea And Vomiting  . Sulfamethoxazole Swelling    REACTION: unspecified  . Tramadol Hcl Nausea Only    Past Medical History  Diagnosis Date  . Atrial fibrillation     Status post AV junction ablation  . Diastolic heart failure     mild volume overload and increased symptoms of shortness of breath  . DJD (degenerative joint disease) of lumbar spine   . Pacemaker [employer]     Original implant [id number], gen  replacement [id number]  . HTN (hypertension)   . History of recurrent UTIs     Dr. Artis Flock   . Sleep disturbance   . Neuropathy   . Gout   . Depression   . Bronchitis   . Complete heart block 07/11/2011    Past Surgical History  Procedure Laterality Date  . Ventricular ablation surgery      atrioventricular nodal ablation  . Knee surgery      right  . Appendectomy    .  Breast biopsy    . Pulse generator implant      placement of internal pulse generator for permanent spinal cord- stimulartor left hip  . Ventricular cardiac pacemaker insertion      [employer] Jude Victory  Pacemaker programmed to ventricular demand pacing  . Insert / replace / remove pacemaker      generator change out 09/29/11    Family History  Problem Relation Age of Onset  . Heart disease Mother     heart failure  . Stroke Father     History   Social History  . Marital Status: Married    Spouse Name: N/A    Number of Children: 2  . Years of Education: N/A   Occupational History  . homemaker    Social History Main Topics  . Smoking status: Former Smoker -- 1.00 packs/day    Types: Cigarettes  . Smokeless tobacco: Never Used     Comment: quit long ago   . Alcohol Use: 0.5 oz/week    1 drink(s) per week     Comment: occ-wine  . Drug Use: No  . Sexual Activity: Not on file   Other Topics Concern  .  Not on file   Social History Narrative   Son Rosanne Ashing holds health care power of attorney. Does request DNR. No tube feeds in cognitively unaware   Review of Systems Appetite is better Weight is down 3#--- seems to be fluid Diuretic changed to torsemide but didn't think that helped----so went back to furosemide and prn zaroxyln Wearing her support stocking    Objective:   Physical Exam  Psychiatric:  Mild depressed mood but speech and appearance are normal Not anxious          Assessment & Plan:

## 2013-05-11 NOTE — Patient Instructions (Signed)
Please trying taking only 1 of the clonazepam for sleep If the daytime sleepiness continues, I may want to try you on a different medicine for sleep

## 2013-05-19 ENCOUNTER — Other Ambulatory Visit: Payer: Self-pay | Admitting: Internal Medicine

## 2013-05-19 ENCOUNTER — Telehealth: Payer: Self-pay | Admitting: Internal Medicine

## 2013-05-19 NOTE — Telephone Encounter (Signed)
She thinks it is her nerves No cough or fever No chest pain  Has been harder for her for the past 3 days Suggested that she come in tomorrow   Will see her at 3:15  Lyla Son, Please add her on then

## 2013-05-19 NOTE — Telephone Encounter (Signed)
The patient called and stated she is short of breath and is hoping to get advice on what to do.  I offered an ov tomorrow, but she states she would like the nurse and dr to be aware of her symptoms.

## 2013-05-20 ENCOUNTER — Ambulatory Visit (INDEPENDENT_AMBULATORY_CARE_PROVIDER_SITE_OTHER): Payer: Medicare Other | Admitting: Internal Medicine

## 2013-05-20 ENCOUNTER — Encounter: Payer: Self-pay | Admitting: Internal Medicine

## 2013-05-20 VITALS — BP 110/70 | HR 72 | Temp 98.5°F | Wt 169.0 lb

## 2013-05-20 DIAGNOSIS — F329 Major depressive disorder, single episode, unspecified: Secondary | ICD-10-CM

## 2013-05-20 DIAGNOSIS — R0602 Shortness of breath: Secondary | ICD-10-CM | POA: Insufficient documentation

## 2013-05-20 DIAGNOSIS — I5032 Chronic diastolic (congestive) heart failure: Secondary | ICD-10-CM

## 2013-05-20 MED ORDER — CITALOPRAM HYDROBROMIDE 10 MG PO TABS: 10.0000 mg | ORAL_TABLET | Freq: Every day | ORAL | Status: DC

## 2013-05-20 MED ORDER — ALPRAZOLAM 0.25 MG PO TABS: 0.1250 mg | ORAL_TABLET | Freq: Two times a day (BID) | ORAL | Status: DC | PRN

## 2013-05-20 NOTE — Assessment & Plan Note (Signed)
Seems to be somatic manifestation of her affective disorder Depression seems worse and with concommitant anxiety ----with the chest symptoms and SOB Will need to be more aggressive  Will give low dose alprazolam for prn use with the SOB

## 2013-05-20 NOTE — Assessment & Plan Note (Signed)
Not exacerbated Doesn't seem to be the cause of her SOB sensation

## 2013-05-20 NOTE — Patient Instructions (Signed)
Start with the citalopram 10mg  every day. Use the alprazolam for when you feel the chest and breathing sensation. Try 1/2 tab first---but you can use a full tab if needed. Be careful after taking to be sure it doesn't affect your balance or coordination.

## 2013-05-20 NOTE — Assessment & Plan Note (Signed)
Clearly worse Now with secondary somatization and anxiety Will start low dose citalopram Alprazolam for prn use Close follow up

## 2013-05-20 NOTE — Progress Notes (Signed)
Subjective:    Patient ID: Carrie [name], female    DOB: 03-10-1920, 79 y.o.   MRN: [id number]  HPI See phone note from yesterday  Has "terrible feeling" under sternum Has to work hard to "get deep breath" Worsens the more she "sits and thinks about it" Occurs many times each day  Does feel depressed Sleep in the past week "has been terrible"--still awakening after a couple of hours sleep and can't get back to sleep Takes the clonazepam --when she takes this it relieves the chest pressure sensation Is off the mirtazapine and it didn't seem to make any difference for her sleep  Current Outpatient Prescriptions on File Prior to Visit  Medication Sig Dispense Refill  . allopurinol (ZYLOPRIM) 100 MG tablet TAKE ONE OR TWO TABLETS DAILY  60 tablet  11  . benazepril (LOTENSIN) 20 MG tablet Take 1 tablet (20 mg total) by mouth daily.  30 tablet  6  . cholecalciferol (VITAMIN D) 1000 UNITS tablet Take 1,000 Units by mouth daily.        . clonazePAM (KLONOPIN) 0.5 MG tablet Take 0.5-1 mg by mouth at bedtime.      Marland Kitchen COLCRYS 0.6 MG tablet TAKE 1 TABLET TWICE A DAY AS NEEDED     FOR GOUT PAIN  60 tablet  11  . fluticasone (FLONASE) 50 MCG/ACT nasal spray Place 2 sprays into the nose daily. In each nostril  16 g  12  . furosemide (LASIX) 80 MG tablet Take 80 mg by mouth daily.      Marland Kitchen lactulose (CHRONULAC) 10 GM/15ML solution TAKE 15 TO (1 TO 2 TABLESPOONFULS) TWICE A DAY AS NEEDED FOR CONSTIPATION  946 mL  1  . metolazone (ZAROXOLYN) 2.5 MG tablet Take 2.5 mg by mouth daily as needed.      . metoprolol tartrate (LOPRESSOR) 25 MG tablet TAKE ONE TABLET TWICE A DAY  60 tablet  11  . potassium chloride SA (K-DUR,KLOR-CON) 20 MEQ tablet Take 1 tablet (20 mEq total) by mouth daily.  30 tablet  3  . warfarin (COUMADIN) 4 MG tablet Take 0.5 tablets (2 mg total) by mouth as directed.  30 tablet  3   No current facility-administered medications on file prior to visit.    Allergies  Allergen  Reactions  . Gabapentin Other (See Comments)    Felt terrible, tremors, etc  . Meperidine Hcl Nausea And Vomiting  . Sulfamethoxazole Swelling    REACTION: unspecified  . Tramadol Hcl Nausea Only    Past Medical History  Diagnosis Date  . Atrial fibrillation     Status post AV junction ablation  . Diastolic heart failure     mild volume overload and increased symptoms of shortness of breath  . DJD (degenerative joint disease) of lumbar spine   . Pacemaker [employer]     Original implant [id number], gen  replacement [id number]  . HTN (hypertension)   . History of recurrent UTIs     Dr. Artis Flock   . Sleep disturbance   . Neuropathy   . Gout   . Depression   . Bronchitis   . Complete heart block 07/11/2011    Past Surgical History  Procedure Laterality Date  . Ventricular ablation surgery      atrioventricular nodal ablation  . Knee surgery      right  . Appendectomy    . Breast biopsy    . Pulse generator implant      placement of internal pulse  generator for permanent spinal cord- stimulartor left hip  . Ventricular cardiac pacemaker insertion      [employer] Jude Victory  Pacemaker programmed to ventricular demand pacing  . Insert / replace / remove pacemaker      generator change out 09/29/11    Family History  Problem Relation Age of Onset  . Heart disease Mother     heart failure  . Stroke Father     History   Social History  . Marital Status: Married    Spouse Name: N/A    Number of Children: 2  . Years of Education: N/A   Occupational History  . homemaker    Social History Main Topics  . Smoking status: Former Smoker -- 1.00 packs/day    Types: Cigarettes  . Smokeless tobacco: Never Used     Comment: quit long ago   . Alcohol Use: 0.5 oz/week    1 drink(s) per week     Comment: occ-wine  . Drug Use: No  . Sexual Activity: Not on file   Other Topics Concern  . Not on file   Social History Narrative   Son Rosanne Ashing holds health care power of attorney. Does  request DNR. No tube feeds in cognitively unaware   Review of Systems Appetite is poor--but no swallowing problems No heartburn Weight down 2#   Objective:   Physical Exam  Constitutional: She appears well-developed and well-nourished. No distress.  Cardiovascular: Normal rate, regular rhythm and normal heart sounds.  Exam reveals no gallop.   No murmur heard. Pulmonary/Chest: Effort normal and breath sounds normal. No respiratory distress. She has no wheezes. She has no rales.  Psychiatric:  Seems depressed Mild psychomotor retardation          Assessment & Plan:

## 2013-05-24 ENCOUNTER — Ambulatory Visit (INDEPENDENT_AMBULATORY_CARE_PROVIDER_SITE_OTHER): Payer: Medicare Other | Admitting: Cardiovascular Disease

## 2013-05-24 ENCOUNTER — Ambulatory Visit (INDEPENDENT_AMBULATORY_CARE_PROVIDER_SITE_OTHER): Payer: Medicare Other

## 2013-05-24 ENCOUNTER — Encounter: Payer: Self-pay | Admitting: Cardiovascular Disease

## 2013-05-24 VITALS — BP 104/58 | HR 75 | Ht 64.0 in | Wt 168.0 lb

## 2013-05-24 DIAGNOSIS — I5032 Chronic diastolic (congestive) heart failure: Secondary | ICD-10-CM

## 2013-05-24 DIAGNOSIS — I4891 Unspecified atrial fibrillation: Secondary | ICD-10-CM

## 2013-05-24 DIAGNOSIS — F329 Major depressive disorder, single episode, unspecified: Secondary | ICD-10-CM

## 2013-05-24 DIAGNOSIS — R5381 Other malaise: Secondary | ICD-10-CM

## 2013-05-24 DIAGNOSIS — R0602 Shortness of breath: Secondary | ICD-10-CM

## 2013-05-24 DIAGNOSIS — R5383 Other fatigue: Secondary | ICD-10-CM

## 2013-05-24 DIAGNOSIS — Z7901 Long term (current) use of anticoagulants: Secondary | ICD-10-CM

## 2013-05-24 NOTE — Assessment & Plan Note (Signed)
Shortness of breath seems to have improved with diuresis to some degree. The slight heaviness she has in her chest at rest is likely from underlying depression. Likely goes along with her anorexia and malaise.

## 2013-05-24 NOTE — Assessment & Plan Note (Signed)
Weight has decreased significantly on Lasix daily with metolazone several times per week. Encouraged her to maintain this weight without further significant weight drop to avoid dehydration. This recent weight loss from fluid has not seemed to help her symptoms of malaise.

## 2013-05-24 NOTE — Assessment & Plan Note (Signed)
Continues to have symptoms of feeling "lousy". Relatively inactive at home. Suggested she continue on her current medications as prescribed by Dr. Alphonsus Sias. Discussed with her that some of these medications may take time to work.

## 2013-05-24 NOTE — Patient Instructions (Addendum)
You are doing well. Please cut the benazepril in 1/2 Please monitor the blood pressure at home  If it runs low, call the office  Please call us if you have new issues that need to be addressed before your next appt.  Your physician wants you to follow-up in: 3 months.  You will receive a reminder letter in the mail two months in advance. If you don't receive a letter, please call our office to schedule the follow-up appointment.

## 2013-05-24 NOTE — Progress Notes (Signed)
Patient ID: Carrie [name], female    DOB: 1919-11-19, 79 y.o.   MRN: [id number]  HPI Comments: 79 year old white female patient with a h/o diastolic heart failure,  pneumonia 01/2010, severe pulmonary hypertension on echo,  long history of back problems requiring surgery,  chronic atrial fibrillation with AV node ablation, placement of pacemaker, depression, who presents for followup.   In followup today, she reports that she is feeling "lousy". She reports that she is "Not sure of herself, needing to use her walker for balance, not eating, has a tightness/fullness in her chest, slight shortness of breath" She's back on Lasix daily with metolazone several times per week with weight decreased from 175 pounds down to 168 pounds today, in the past month. Leg edema has improved, still with mild edema of her feet. Blood pressures running low. She does not think that she's had much improvement in her energy or the way she feels on new medications such as Celexa and Ativan.  Recent lab work showing creatinine 1.33, BUN 36 Pacemaker change out  by Dr. Graciela Husbands with no problems.   EKG showing normal sinus rhythm with rate 75 beats per minute , with intraventricular conduction delay, left anterior fascicular block    Outpatient Encounter Prescriptions as of 05/24/2013  Medication Sig Dispense Refill  . allopurinol (ZYLOPRIM) 100 MG tablet TAKE ONE OR TWO TABLETS DAILY  60 tablet  11  . ALPRAZolam (XANAX) 0.25 MG tablet Take 0.5-1 tablets (0.125-0.25 mg total) by mouth 2 (two) times daily as needed for anxiety.  20 tablet  0  . benazepril (LOTENSIN) 20 MG tablet Take 1 tablet (20 mg total) by mouth daily.  30 tablet  6  . cholecalciferol (VITAMIN D) 1000 UNITS tablet Take 1,000 Units by mouth daily.        . citalopram (CELEXA) 10 MG tablet Take 1 tablet (10 mg total) by mouth daily.  30 tablet  5  . clonazePAM (KLONOPIN) 0.5 MG tablet Take 0.5-1 mg by mouth at bedtime.      Marland Kitchen COLCRYS 0.6 MG tablet TAKE  1 TABLET TWICE A DAY AS NEEDED     FOR GOUT PAIN  60 tablet  11  . fluticasone (FLONASE) 50 MCG/ACT nasal spray Place 2 sprays into the nose daily. In each nostril  16 g  12  . furosemide (LASIX) 80 MG tablet Take 80 mg by mouth daily.      Marland Kitchen lactulose (CHRONULAC) 10 GM/15ML solution TAKE 15 TO (1 TO 2 TABLESPOONFULS) TWICE A DAY AS NEEDED FOR CONSTIPATION  946 mL  1  . metolazone (ZAROXOLYN) 2.5 MG tablet Take 2.5 mg by mouth daily as needed.      . metoprolol tartrate (LOPRESSOR) 25 MG tablet TAKE ONE TABLET TWICE A DAY  60 tablet  11  . potassium chloride SA (K-DUR,KLOR-CON) 20 MEQ tablet Take 1 tablet (20 mEq total) by mouth daily.  30 tablet  3  . warfarin (COUMADIN) 4 MG tablet Take 0.5 tablets (2 mg total) by mouth as directed.  30 tablet  3   Review of Systems  Constitutional: Positive for activity change and fatigue.  HENT: Negative.   Eyes: Negative.   Respiratory: Negative.   Cardiovascular: Positive for leg swelling.  Gastrointestinal: Negative.   Endocrine: Negative.   Musculoskeletal: Positive for back pain, joint swelling and arthralgias.  Skin: Negative.   Allergic/Immunologic: Negative.   Neurological: Positive for weakness.  Hematological: Negative.   Psychiatric/Behavioral: Negative.   All other  systems reviewed and are negative.    BP 104/58  Pulse 75  Ht 5\' 4"  (1.626 m)  Wt 168 lb (76.204 kg)  BMI 28.82 kg/m2  Physical Exam  Nursing note and vitals reviewed. Constitutional: She is oriented to person, place, and time. She appears well-developed and well-nourished.  HENT:  Head: Normocephalic.  Nose: Nose normal.  Mouth/Throat: Oropharynx is clear and moist.  Eyes: Conjunctivae are normal. Pupils are equal, round, and reactive to light.  Neck: Normal range of motion. Neck supple. No JVD present.  Cardiovascular: Normal rate, regular rhythm, S1 normal, S2 normal and intact distal pulses.  Exam reveals no gallop and no friction rub.   Murmur heard.   Crescendo systolic murmur is present with a grade of 2/6  Pulmonary/Chest: Effort normal and breath sounds normal. No respiratory distress. She has no wheezes. She has no rales. She exhibits no tenderness.  Abdominal: Soft. Bowel sounds are normal. She exhibits no distension. There is no tenderness.  Musculoskeletal: Normal range of motion. She exhibits no edema and no tenderness.  Lymphadenopathy:    She has no cervical adenopathy.  Neurological: She is alert and oriented to person, place, and time. Coordination normal.  Skin: Skin is warm and dry. No rash noted. No erythema.  Psychiatric: She has a normal mood and affect. Her behavior is normal. Judgment and thought content normal.    Assessment and Plan

## 2013-05-30 ENCOUNTER — Telehealth: Payer: Self-pay

## 2013-05-30 NOTE — Telephone Encounter (Signed)
Carrie [name] pts daughter left v/m that pt was seen on 05/20/13; pt has been taking citalopram; pt states she does not feel any better. Carrie [name] said pt is very sleepy during the day and pt taking naps in AM and the afternoon.[place].Please advise.

## 2013-05-30 NOTE — Telephone Encounter (Signed)
Please let her know that the citalopram will usually take 2-3 weeks to work and she only started it 9-10 days ago. It is too soon to make changes It might help if she can come in with her for her upcoming follow up and we can review all options at that point  I do think depression is the major issue now---and I am hoping the citalopram will help this

## 2013-05-30 NOTE — Telephone Encounter (Signed)
Spoke with daughter and advised results. She will speak with her mother

## 2013-06-01 ENCOUNTER — Ambulatory Visit (INDEPENDENT_AMBULATORY_CARE_PROVIDER_SITE_OTHER): Payer: Medicare Other

## 2013-06-01 DIAGNOSIS — N390 Urinary tract infection, site not specified: Secondary | ICD-10-CM

## 2013-06-01 DIAGNOSIS — Z7901 Long term (current) use of anticoagulants: Secondary | ICD-10-CM

## 2013-06-01 DIAGNOSIS — I4891 Unspecified atrial fibrillation: Secondary | ICD-10-CM

## 2013-06-01 MED ORDER — WARFARIN SODIUM 2 MG PO TABS: ORAL_TABLET | ORAL | Status: AC

## 2013-06-06 ENCOUNTER — Telehealth: Payer: Self-pay

## 2013-06-06 DIAGNOSIS — E860 Dehydration: Secondary | ICD-10-CM

## 2013-06-06 NOTE — Telephone Encounter (Signed)
Would agree, hold her benazepril for now and monitor blood pressure. Would make sure that she is not taking too much diuretic

## 2013-06-06 NOTE — Telephone Encounter (Signed)
Spoke w/ pt.  States that her BP reading have been: 9/27:  88/62  91/60  106/73 9/28: 99/67  100/71  97/65 States that she has been cutting her benazepril 20mg  in 1/2, but her pressures are still running low and she feels bad. Instructed pt not to take any benazepril until I call her back with instructions from Dr. Mariah Milling.

## 2013-06-07 NOTE — Telephone Encounter (Signed)
I spoke with the patient and made her aware of Dr. Windell Hummingbird recommendations. I inquired about the patient's weight. She has not weighed today, but yesterday was 161 lbs. She was 168 lbs on 9/16 at her office visit with Dr. Mariah Milling. I asked the patient what her baseline weight is, she reports that mostly she runs around 170 lbs. She is on lasix 80 mg once daily. She has not taken any metolazone lately. I advised I will forward to Dr. Mariah Milling for review on her fluid status and call her back with recommendations. She is agreeable.

## 2013-06-07 NOTE — Telephone Encounter (Signed)
This was routed to [place] at [facility] triage pool in error. Thanks.

## 2013-06-07 NOTE — Telephone Encounter (Signed)
I am concerned about this much lasix. Would cut back on lasix as she has been dehydrated before. Cut dose in 1/2. Not all fluid in feet is from fluid overload. She has venous issue. She may put herself in renal failure. Would consider doing a BMP to confirm

## 2013-06-08 ENCOUNTER — Other Ambulatory Visit: Payer: Self-pay

## 2013-06-08 NOTE — Telephone Encounter (Signed)
Spoke w/ pt.  She is agreeable to cutting her lasix in half. States that she drinks water all the time, but can't pee. She will try to get someone to bring her over for bloodwork, hopefully tomorrow. Pt instructed to call the office when she has a ride to let us know what time is good for her.

## 2013-06-09 ENCOUNTER — Encounter: Payer: Self-pay | Admitting: Internal Medicine

## 2013-06-09 ENCOUNTER — Ambulatory Visit (INDEPENDENT_AMBULATORY_CARE_PROVIDER_SITE_OTHER): Payer: Medicare Other | Admitting: Internal Medicine

## 2013-06-09 VITALS — BP 110/60 | HR 72 | Temp 98.0°F | Wt 164.0 lb

## 2013-06-09 DIAGNOSIS — I5032 Chronic diastolic (congestive) heart failure: Secondary | ICD-10-CM

## 2013-06-09 DIAGNOSIS — I4891 Unspecified atrial fibrillation: Secondary | ICD-10-CM

## 2013-06-09 DIAGNOSIS — E860 Dehydration: Secondary | ICD-10-CM

## 2013-06-09 DIAGNOSIS — F329 Major depressive disorder, single episode, unspecified: Secondary | ICD-10-CM

## 2013-06-09 MED ORDER — CITALOPRAM HYDROBROMIDE 20 MG PO TABS: 20.0000 mg | ORAL_TABLET | Freq: Every day | ORAL | Status: AC

## 2013-06-09 NOTE — Patient Instructions (Signed)
Please increase your citalopram to 20mg  daily. You can use 2 of the 10mg  tabs daily till they run out. Try this in the morning.  Try to decrease the clonazepam and use the lowest dose at night that helps you sleep.  Call Dr Mariah Milling for instructions if your home weight gets up to 165#

## 2013-06-09 NOTE — Addendum Note (Signed)
Addended by: Alvina Chou on: 06/09/2013 03:24 PM   Modules accepted: Orders

## 2013-06-09 NOTE — Progress Notes (Signed)
Subjective:    Patient ID: Carrie [name], female    DOB: June 27, 1920, 79 y.o.   MRN: [id number]  HPI "Better but not all right"  Has been resting better at night Feels tired though---"I just want to stay in bed or in my chair" Breathing is better Has been weighing herself  Appetite is still poor Nothing appeals to her  Still depressed Anhedonia persists "I feel like I am in a hole I can't get out of....... Terrible feeling" Doesn't want to see anybody, etc  Current Outpatient Prescriptions on File Prior to Visit  Medication Sig Dispense Refill  . allopurinol (ZYLOPRIM) 100 MG tablet TAKE ONE OR TWO TABLETS DAILY  60 tablet  11  . ALPRAZolam (XANAX) 0.25 MG tablet Take 0.5-1 tablets (0.125-0.25 mg total) by mouth 2 (two) times daily as needed for anxiety.  20 tablet  0  . cholecalciferol (VITAMIN D) 1000 UNITS tablet Take 1,000 Units by mouth daily.        . citalopram (CELEXA) 10 MG tablet Take 1 tablet (10 mg total) by mouth daily.  30 tablet  5  . clonazePAM (KLONOPIN) 0.5 MG tablet Take 0.5-1 mg by mouth at bedtime.      Marland Kitchen COLCRYS 0.6 MG tablet TAKE 1 TABLET TWICE A DAY AS NEEDED     FOR GOUT PAIN  60 tablet  11  . fluticasone (FLONASE) 50 MCG/ACT nasal spray Place 2 sprays into the nose daily. In each nostril  16 g  12  . furosemide (LASIX) 80 MG tablet Take 80 mg by mouth daily.      Marland Kitchen lactulose (CHRONULAC) 10 GM/15ML solution TAKE 15 TO (1 TO 2 TABLESPOONFULS) TWICE A DAY AS NEEDED FOR CONSTIPATION  946 mL  1  . metolazone (ZAROXOLYN) 2.5 MG tablet Take 2.5 mg by mouth daily as needed.      . metoprolol tartrate (LOPRESSOR) 25 MG tablet TAKE ONE TABLET TWICE A DAY  60 tablet  11  . potassium chloride SA (K-DUR,KLOR-CON) 20 MEQ tablet Take 1 tablet (20 mEq total) by mouth daily.  30 tablet  3  . warfarin (COUMADIN) 2 MG tablet Take as directed by anticoagulation clinic  30 tablet  3   No current facility-administered medications on file prior to visit.    Allergies   Allergen Reactions  . Gabapentin Other (See Comments)    Felt terrible, tremors, etc  . Meperidine Hcl Nausea And Vomiting  . Sulfamethoxazole Swelling    REACTION: unspecified  . Tramadol Hcl Nausea Only    Past Medical History  Diagnosis Date  . Atrial fibrillation     Status post AV junction ablation  . Diastolic heart failure     mild volume overload and increased symptoms of shortness of breath  . DJD (degenerative joint disease) of lumbar spine   . Pacemaker [employer]     Original implant [id number], gen  replacement [id number]  . HTN (hypertension)   . History of recurrent UTIs     Dr. Artis Flock   . Sleep disturbance   . Neuropathy   . Gout   . Depression   . Bronchitis   . Complete heart block 07/11/2011    Past Surgical History  Procedure Laterality Date  . Ventricular ablation surgery      atrioventricular nodal ablation  . Knee surgery      right  . Appendectomy    . Breast biopsy    . Pulse generator implant  placement of internal pulse generator for permanent spinal cord- stimulartor left hip  . Ventricular cardiac pacemaker insertion      [employer]. Jude Victory  Pacemaker programmed to ventricular demand pacing  . Insert / replace / remove pacemaker      generator change out 09/29/11    Family History  Problem Relation Age of Onset  . Heart disease Mother     heart failure  . Stroke Father     History   Social History  . Marital Status: Married    Spouse Name: N/A    Number of Children: 2  . Years of Education: N/A   Occupational History  . homemaker    Social History Main Topics  . Smoking status: Former Smoker -- 1.00 packs/day    Types: Cigarettes  . Smokeless tobacco: Never Used     Comment: quit long ago   . Alcohol Use: 0.5 oz/week    1 drink(s) per week     Comment: occ-wine  . Drug Use: No  . Sexual Activity: Not on file   Other Topics Concern  . Not on file   Social History Narrative   Son Rosanne Ashing holds health care power of attorney.  Does request DNR. No tube feeds in cognitively unaware   Review of Systems Hasn't been taking the xanax---makes her too sleepy Does take 2 clonazepam at night---up from 1.5 before    Objective:   Physical Exam  Constitutional: She appears well-developed and well-nourished. No distress.  Neck: Normal range of motion. Neck supple.  Cardiovascular: Normal rate, regular rhythm and normal heart sounds.  Exam reveals no gallop.   No murmur heard. Pulmonary/Chest: Effort normal and breath sounds normal. No respiratory distress. She has no wheezes. She has no rales.  Musculoskeletal: She exhibits no edema.  Lymphadenopathy:    She has no cervical adenopathy.  Psychiatric:  Depressed  Psychomotor retardation          Assessment & Plan:

## 2013-06-09 NOTE — Assessment & Plan Note (Signed)
Better Close to dry weight 161+# today at home Asked her to call Dr Mariah Milling for instructions if home weight gets up to 165#

## 2013-06-09 NOTE — Assessment & Plan Note (Signed)
Ongoing MDD Will increase the citalopram Refer for counseling

## 2013-06-10 LAB — BASIC METABOLIC PANEL
CO2: 26 mmol/L (ref 18–29)
Calcium: 10 mg/dL (ref 8.6–10.2)
Chloride: 89 mmol/L — ABNORMAL LOW (ref 97–108)
GFR calc non Af Amer: 41 mL/min/{1.73_m2} — ABNORMAL LOW (ref >59)
Glucose: 155 mg/dL — ABNORMAL HIGH (ref 65–99)
Potassium: 4.7 mmol/L (ref 3.5–5.2)
Sodium: 133 mmol/L — ABNORMAL LOW (ref 134–144)

## 2013-06-14 ENCOUNTER — Telehealth: Payer: Self-pay

## 2013-06-14 ENCOUNTER — Encounter: Payer: Self-pay | Admitting: Internal Medicine

## 2013-06-14 NOTE — Telephone Encounter (Signed)
Pt called wanting results of her recent lab work. She states that she has gone back on a whole lasix a day, as she can't pee.   States that she drinks "plenty of water every day" per her PCPs instructions. She keeps her feet up, but her feel swell on a 1/2 a pill. She would like to know if she can continue on a whole pill. Please advise.  Thank you.

## 2013-06-14 NOTE — Telephone Encounter (Signed)
Renal function shows mild dehydration Ok to take a full lasix pill if she would like, but I think her swelling is from veins, not fluid overload. TED hose will help the most with leg elevation

## 2013-06-15 ENCOUNTER — Ambulatory Visit (INDEPENDENT_AMBULATORY_CARE_PROVIDER_SITE_OTHER): Payer: Medicare Other | Admitting: General Practice

## 2013-06-15 DIAGNOSIS — Z7901 Long term (current) use of anticoagulants: Secondary | ICD-10-CM

## 2013-06-15 DIAGNOSIS — I4891 Unspecified atrial fibrillation: Secondary | ICD-10-CM

## 2013-06-15 LAB — POCT INR: INR: 2.9

## 2013-06-15 NOTE — Telephone Encounter (Signed)
Spoke w/ pt.  She is aware of results. Reinforced the need to wear her compression hose and the difference b/t venous insufficiency & fluid overload. She verbalizes understanding and will look into purchasing zipper compression hose.

## 2013-06-23 ENCOUNTER — Ambulatory Visit (INDEPENDENT_AMBULATORY_CARE_PROVIDER_SITE_OTHER): Payer: Medicare Other | Admitting: Psychology

## 2013-06-23 DIAGNOSIS — F329 Major depressive disorder, single episode, unspecified: Secondary | ICD-10-CM

## 2013-06-24 ENCOUNTER — Telehealth: Payer: Self-pay

## 2013-06-24 NOTE — Telephone Encounter (Signed)
Message copied by Marilynne Halsted on Fri Jun 24, 2013  4:07 PM ------      Message from: Carrie [name]      Created: Fri Jun 24, 2013  4:04 PM       Renal function and potassium stable. Mild hyponatremia likely from chronic diastolic CHF. Continue to monitor. ------

## 2013-06-24 NOTE — Telephone Encounter (Signed)
Spoke w/ pt.  She is aware of results.  States that she is so weak that she "can't hardly get around". She met with a counselor yesterday who recommended that she see Dr. Alphonsus Sias earlier than scheduled. Counselor wants to ensure that meds are not making her feel so tired, as pt "wants to sleep all day on." Pt sched to see Dr. Alphonsus Sias on Monday.  Reports that blood pressure has been in the range of 118/70. Drinks juice til it "makes me sick".  She would like to go back to drinking water, but discouraged her from doing so as her Na+ is low.  She will discuss this with Dr. Alphonsus Sias at her appt on Monday.

## 2013-06-27 ENCOUNTER — Ambulatory Visit (INDEPENDENT_AMBULATORY_CARE_PROVIDER_SITE_OTHER): Payer: Medicare Other | Admitting: Internal Medicine

## 2013-06-27 ENCOUNTER — Encounter: Payer: Self-pay | Admitting: Internal Medicine

## 2013-06-27 VITALS — BP 130/60 | HR 74 | Temp 98.0°F | Wt 165.0 lb

## 2013-06-27 DIAGNOSIS — F329 Major depressive disorder, single episode, unspecified: Secondary | ICD-10-CM

## 2013-06-27 MED ORDER — METHYLPHENIDATE HCL 5 MG PO TABS: 5.0000 mg | ORAL_TABLET | Freq: Two times a day (BID) | ORAL | Status: DC

## 2013-06-27 NOTE — Progress Notes (Signed)
Subjective:    Patient ID: Carrie [name], female    DOB: May 04, 1920, 79 y.o.   MRN: [id number]  HPI Not really feeling better "I feel like I am going downhill" Counselor feels it may be some of her meds causing her weakness Not able to walk without rollator now--had been using a cane  Has cut the clonazepam down to 1/2 tab Not resting well at all Not as sleepy in the day though---just weak and tired Not taking the alprazolam at all  Dr Carrie [name] did cut back on her diuretic with the last renal function Mild increase in edema since then  Current Outpatient Prescriptions on File Prior to Visit  Medication Sig Dispense Refill  . allopurinol (ZYLOPRIM) 100 MG tablet TAKE ONE OR TWO TABLETS DAILY  60 tablet  11  . ALPRAZolam (XANAX) 0.25 MG tablet Take 0.5-1 tablets (0.125-0.25 mg total) by mouth 2 (two) times daily as needed for anxiety.  20 tablet  0  . cholecalciferol (VITAMIN D) 1000 UNITS tablet Take 1,000 Units by mouth daily.        . citalopram (CELEXA) 20 MG tablet Take 1 tablet (20 mg total) by mouth daily.  30 tablet  11  . clonazePAM (KLONOPIN) 0.5 MG tablet Take 0.5-1 mg by mouth at bedtime.      Marland Kitchen COLCRYS 0.6 MG tablet TAKE 1 TABLET TWICE A DAY AS NEEDED     FOR GOUT PAIN  60 tablet  11  . fluticasone (FLONASE) 50 MCG/ACT nasal spray Place 2 sprays into the nose daily. In each nostril  16 g  12  . furosemide (LASIX) 80 MG tablet Take 80 mg by mouth daily.      Marland Kitchen lactulose (CHRONULAC) 10 GM/15ML solution TAKE 15 TO (1 TO 2 TABLESPOONFULS) TWICE A DAY AS NEEDED FOR CONSTIPATION  946 mL  1  . metolazone (ZAROXOLYN) 2.5 MG tablet Take 2.5 mg by mouth daily as needed.      . metoprolol tartrate (LOPRESSOR) 25 MG tablet TAKE ONE TABLET TWICE A DAY  60 tablet  11  . potassium chloride SA (K-DUR,KLOR-CON) 20 MEQ tablet Take 1 tablet (20 mEq total) by mouth daily.  30 tablet  3  . warfarin (COUMADIN) 2 MG tablet Take as directed by anticoagulation clinic  30 tablet  3   No  current facility-administered medications on file prior to visit.    Allergies  Allergen Reactions  . Gabapentin Other (See Comments)    Felt terrible, tremors, etc  . Meperidine Hcl Nausea And Vomiting  . Sulfamethoxazole Swelling    REACTION: unspecified  . Tramadol Hcl Nausea Only    Past Medical History  Diagnosis Date  . Atrial fibrillation     Status post AV junction ablation  . Diastolic heart failure     mild volume overload and increased symptoms of shortness of breath  . DJD (degenerative joint disease) of lumbar spine   . Pacemaker [employer]     Original implant [id number], gen  replacement [id number]  . HTN (hypertension)   . History of recurrent UTIs     Dr. Artis [name]   . Sleep disturbance   . Neuropathy   . Gout   . Depression   . Bronchitis   . Complete heart block 07/11/2011    Past Surgical History  Procedure Laterality Date  . Ventricular ablation surgery      atrioventricular nodal ablation  . Knee surgery      right  . Appendectomy    .  Breast biopsy    . Pulse generator implant      placement of internal pulse generator for permanent spinal cord- stimulartor left hip  . Ventricular cardiac pacemaker insertion      [employer]. Jude Victory  Pacemaker programmed to ventricular demand pacing  . Insert / replace / remove pacemaker      generator change out 09/29/11    Family History  Problem Relation Age of Onset  . Heart disease Mother     heart failure  . Stroke Father     History   Social History  . Marital Status: Married    Spouse Name: N/A    Number of Children: 2  . Years of Education: N/A   Occupational History  . homemaker    Social History Main Topics  . Smoking status: Former Smoker -- 1.00 packs/day    Types: Cigarettes  . Smokeless tobacco: Never Used     Comment: quit long ago   . Alcohol Use: 0.5 oz/week    1 drink(s) per week     Comment: occ-wine  . Drug Use: No  . Sexual Activity: Not on file   Other Topics Concern  . Not on  file   Social History Narrative   Son Carrie [name] holds health care power of attorney. Does request DNR. No tube feeds in cognitively unaware   Review of Systems Appetite is still poor---she eats because she feels she is supposed to Weight is stable Breathing is about the same    Objective:   Physical Exam  Constitutional: She appears well-developed. No distress.  Psychiatric:  Fluent speech but clear psychomotor retardation depressed          Assessment & Plan:

## 2013-06-27 NOTE — Assessment & Plan Note (Signed)
Not really better with the citalopram No new meds Very little of the benzodiazepine---so I don't think her meds are causing her fatigue  Will try methylphenidate

## 2013-06-27 NOTE — Patient Instructions (Signed)
Please continue the citalopram 20mg  daily Add methylphenidate 1/2 tab (2.5mg ) twice a day after breakfast and lunch. If you have no side effects after 3 days (like irritability, decreased appetite, dizziness), go up to a full tab twice a day.

## 2013-06-30 ENCOUNTER — Ambulatory Visit: Payer: Medicare Other | Admitting: Internal Medicine

## 2013-07-06 ENCOUNTER — Ambulatory Visit (INDEPENDENT_AMBULATORY_CARE_PROVIDER_SITE_OTHER): Payer: Medicare Other | Admitting: General Practice

## 2013-07-06 DIAGNOSIS — I4891 Unspecified atrial fibrillation: Secondary | ICD-10-CM

## 2013-07-06 DIAGNOSIS — Z7901 Long term (current) use of anticoagulants: Secondary | ICD-10-CM

## 2013-07-07 ENCOUNTER — Ambulatory Visit (INDEPENDENT_AMBULATORY_CARE_PROVIDER_SITE_OTHER): Payer: Medicare Other | Admitting: Psychology

## 2013-07-07 DIAGNOSIS — F329 Major depressive disorder, single episode, unspecified: Secondary | ICD-10-CM

## 2013-07-11 ENCOUNTER — Ambulatory Visit: Payer: Medicare Other | Admitting: Internal Medicine

## 2013-07-13 ENCOUNTER — Encounter: Payer: Self-pay | Admitting: Internal Medicine

## 2013-07-13 ENCOUNTER — Ambulatory Visit (INDEPENDENT_AMBULATORY_CARE_PROVIDER_SITE_OTHER): Payer: Medicare Other | Admitting: Internal Medicine

## 2013-07-13 VITALS — BP 110/78 | HR 68 | Temp 98.2°F | Wt 161.0 lb

## 2013-07-13 DIAGNOSIS — I5032 Chronic diastolic (congestive) heart failure: Secondary | ICD-10-CM

## 2013-07-13 DIAGNOSIS — F329 Major depressive disorder, single episode, unspecified: Secondary | ICD-10-CM

## 2013-07-13 MED ORDER — METHYLPHENIDATE HCL 5 MG PO TABS: 5.0000 mg | ORAL_TABLET | Freq: Two times a day (BID) | ORAL | Status: DC

## 2013-07-13 NOTE — Assessment & Plan Note (Signed)
Breathing is stable She is not comfortable holding the furosemide Told her to take it if her weight is over 160#

## 2013-07-13 NOTE — Progress Notes (Signed)
Subjective:    Patient ID: Carrie [name], female    DOB: 1920-01-04, 79 y.o.   MRN: [id number]  HPI Feels some better but "not all right" No problems with the methylphenidate--did increase to 5 bid after 3 days Not clear that it is doing much  The depressed feelings are not all the time now Sleeping better-- using 1.5 of the clonazepam. Rests well and able to get back to sleep at 4AM when she awakes for the bathroom Wakes ~7AM and gets out of bed right away Still can nap easily if she is just sitting around  Eating some better Weight is down 4#--?fluid Breathing is stable Continuing the lasix---"I just feel that I have to have it"  Current Outpatient Prescriptions on File Prior to Visit  Medication Sig Dispense Refill  . allopurinol (ZYLOPRIM) 100 MG tablet TAKE ONE OR TWO TABLETS DAILY  60 tablet  11  . ALPRAZolam (XANAX) 0.25 MG tablet Take 0.5-1 tablets (0.125-0.25 mg total) by mouth 2 (two) times daily as needed for anxiety.  20 tablet  0  . cholecalciferol (VITAMIN D) 1000 UNITS tablet Take 1,000 Units by mouth daily.        . citalopram (CELEXA) 20 MG tablet Take 1 tablet (20 mg total) by mouth daily.  30 tablet  11  . clonazePAM (KLONOPIN) 0.5 MG tablet Take 0.5-1 mg by mouth at bedtime.      Marland Kitchen COLCRYS 0.6 MG tablet TAKE 1 TABLET TWICE A DAY AS NEEDED     FOR GOUT PAIN  60 tablet  11  . fluticasone (FLONASE) 50 MCG/ACT nasal spray Place 2 sprays into the nose daily. In each nostril  16 g  12  . furosemide (LASIX) 80 MG tablet Take 80 mg by mouth daily.      Marland Kitchen lactulose (CHRONULAC) 10 GM/15ML solution TAKE 15 TO (1 TO 2 TABLESPOONFULS) TWICE A DAY AS NEEDED FOR CONSTIPATION  946 mL  1  . methylphenidate (RITALIN) 5 MG tablet Take 1 tablet (5 mg total) by mouth 2 (two) times daily.  60 tablet  0  . metolazone (ZAROXOLYN) 2.5 MG tablet Take 2.5 mg by mouth daily as needed.      . metoprolol tartrate (LOPRESSOR) 25 MG tablet TAKE ONE TABLET TWICE A DAY  60 tablet  11  .  potassium chloride SA (K-DUR,KLOR-CON) 20 MEQ tablet Take 1 tablet (20 mEq total) by mouth daily.  30 tablet  3  . warfarin (COUMADIN) 2 MG tablet Take as directed by anticoagulation clinic  30 tablet  3   No current facility-administered medications on file prior to visit.    Allergies  Allergen Reactions  . Gabapentin Other (See Comments)    Felt terrible, tremors, etc  . Meperidine Hcl Nausea And Vomiting  . Sulfamethoxazole Swelling    REACTION: unspecified  . Tramadol Hcl Nausea Only    Past Medical History  Diagnosis Date  . Atrial fibrillation     Status post AV junction ablation  . Diastolic heart failure     mild volume overload and increased symptoms of shortness of breath  . DJD (degenerative joint disease) of lumbar spine   . Pacemaker [employer]     Original implant [id number], gen  replacement [id number]  . HTN (hypertension)   . History of recurrent UTIs     Dr. Artis Flock   . Sleep disturbance   . Neuropathy   . Gout   . Depression   . Bronchitis   .  Complete heart block 07/11/2011    Past Surgical History  Procedure Laterality Date  . Ventricular ablation surgery      atrioventricular nodal ablation  . Knee surgery      right  . Appendectomy    . Breast biopsy    . Pulse generator implant      placement of internal pulse generator for permanent spinal cord- stimulartor left hip  . Ventricular cardiac pacemaker insertion      St. Jude Victory  Pacemaker programmed to ventricular demand pacing  . Insert / replace / remove pacemaker      generator change out 09/29/11    Family History  Problem Relation Age of Onset  . Heart disease Mother     heart failure  . Stroke Father     History   Social History  . Marital Status: Married    Spouse Name: N/A    Number of Children: 2  . Years of Education: N/A   Occupational History  . homemaker    Social History Main Topics  . Smoking status: Former Smoker -- 1.00 packs/day    Types: Cigarettes  . Smokeless  tobacco: Never Used     Comment: quit long ago   . Alcohol Use: 0.5 oz/week    1 drink(s) per week     Comment: occ-wine  . Drug Use: No  . Sexual Activity: Not on file   Other Topics Concern  . Not on file   Social History Narrative   Son Rosanne Ashing holds health care power of attorney. Does request DNR. No tube feeds in cognitively unaware   Review of Systems Precancerous lesion recurred on tip of nose--- dermatologist excised it Has been getting out some--friends have visited. Playing bridge again ("making myself do these things")    Objective:   Physical Exam  Constitutional: She appears well-developed. No distress.  Neck: Normal range of motion. Neck supple.  Cardiovascular: Normal rate, regular rhythm and normal heart sounds.  Exam reveals no gallop.   No murmur heard. Pulmonary/Chest: Effort normal and breath sounds normal. No respiratory distress. She has no wheezes. She has no rales.  Lymphadenopathy:    She has no cervical adenopathy.  Psychiatric:  Still mild psychomotor retardation but speech is somewhat brighter          Assessment & Plan:

## 2013-07-13 NOTE — Assessment & Plan Note (Signed)
Finally some improvement but still depressed Getting out some socially Discussed trying to work on exercise with the fitness director  Will have her continue the methylphenidate and citalopram Seeing counselor still

## 2013-07-14 ENCOUNTER — Ambulatory Visit: Payer: Medicare Other | Admitting: Internal Medicine

## 2013-07-15 ENCOUNTER — Other Ambulatory Visit: Payer: Self-pay | Admitting: Internal Medicine

## 2013-07-15 NOTE — Telephone Encounter (Signed)
rx called into pharmacy

## 2013-07-15 NOTE — Telephone Encounter (Signed)
Last filled 06/04/2013

## 2013-07-15 NOTE — Telephone Encounter (Signed)
Okay #60 x 0 

## 2013-07-15 NOTE — Telephone Encounter (Signed)
Pt left v/m requesting status of clonazepam refill. [facility] said rx ready for pick up.left v/m for pt rx ready for pick up at [facility].

## 2013-07-20 ENCOUNTER — Ambulatory Visit (INDEPENDENT_AMBULATORY_CARE_PROVIDER_SITE_OTHER): Payer: Medicare Other | Admitting: *Deleted

## 2013-07-20 DIAGNOSIS — I4891 Unspecified atrial fibrillation: Secondary | ICD-10-CM

## 2013-07-20 DIAGNOSIS — Z7901 Long term (current) use of anticoagulants: Secondary | ICD-10-CM

## 2013-07-20 LAB — POCT INR: INR: 2.9

## 2013-07-21 ENCOUNTER — Ambulatory Visit: Payer: Medicare Other | Admitting: Internal Medicine

## 2013-07-23 ENCOUNTER — Other Ambulatory Visit: Payer: Self-pay | Admitting: Internal Medicine

## 2013-08-10 ENCOUNTER — Ambulatory Visit (INDEPENDENT_AMBULATORY_CARE_PROVIDER_SITE_OTHER): Payer: Medicare Other | Admitting: General Practice

## 2013-08-10 ENCOUNTER — Ambulatory Visit (INDEPENDENT_AMBULATORY_CARE_PROVIDER_SITE_OTHER): Payer: Medicare Other | Admitting: Internal Medicine

## 2013-08-10 ENCOUNTER — Encounter: Payer: Self-pay | Admitting: Internal Medicine

## 2013-08-10 VITALS — BP 116/74 | HR 72 | Temp 97.3°F | Ht 64.0 in | Wt 158.2 lb

## 2013-08-10 DIAGNOSIS — I4891 Unspecified atrial fibrillation: Secondary | ICD-10-CM

## 2013-08-10 DIAGNOSIS — I5032 Chronic diastolic (congestive) heart failure: Secondary | ICD-10-CM

## 2013-08-10 DIAGNOSIS — Z7901 Long term (current) use of anticoagulants: Secondary | ICD-10-CM

## 2013-08-10 DIAGNOSIS — I872 Venous insufficiency (chronic) (peripheral): Secondary | ICD-10-CM

## 2013-08-10 DIAGNOSIS — F329 Major depressive disorder, single episode, unspecified: Secondary | ICD-10-CM

## 2013-08-10 LAB — BASIC METABOLIC PANEL
BUN: 25 mg/dL — ABNORMAL HIGH (ref 6–23)
CO2: 29 mEq/L (ref 19–32)
Calcium: 9.7 mg/dL (ref 8.4–10.5)
Chloride: 100 mEq/L (ref 96–112)
Creatinine, Ser: 1.1 mg/dL (ref 0.4–1.2)
GFR: 48.96 mL/min — ABNORMAL LOW (ref >60.00)
Glucose, Bld: 108 mg/dL — ABNORMAL HIGH (ref 70–99)

## 2013-08-10 LAB — POCT INR: INR: 2.9

## 2013-08-10 NOTE — Assessment & Plan Note (Addendum)
Some edema today though weight down Probably due to air travel yesterday

## 2013-08-10 NOTE — Assessment & Plan Note (Signed)
Concerned about dehydration Home weight down to about 152# May need to hold the furosemide if under 155--will check met b now

## 2013-08-10 NOTE — Progress Notes (Signed)
Subjective:    Patient ID: Carrie [name], female    DOB: 1920-04-14, 79 y.o.   MRN: [id number]  HPI She feels better No comparison to when this all just started Still not back to normal Still has occasional bad moments---trouble getting started in the morning at times But overall better Still seeing Terri  Sleeps fairly well with 1.5 of the clonazepam Appetite is really good now Micah Flesher to daughter in [place] back yesterday. Had "wonderful time" Having more social engagement at [facility] now  Concerned about dehydration Not voiding as much Mouth is dry Held lasix yesterday for travel Current Outpatient Prescriptions on File Prior to Visit  Medication Sig Dispense Refill  . allopurinol (ZYLOPRIM) 100 MG tablet TAKE ONE OR TWO TABLETS DAILY  60 tablet  11  . cholecalciferol (VITAMIN D) 1000 UNITS tablet Take 1,000 Units by mouth daily.        . citalopram (CELEXA) 20 MG tablet Take 1 tablet (20 mg total) by mouth daily.  30 tablet  11  . clonazePAM (KLONOPIN) 0.5 MG tablet TAKE TWO TABLETS AT BEDTIME  60 tablet  0  . furosemide (LASIX) 80 MG tablet TAKE ONE TABLET EVERY MORNING HOLD IF YOUR WEIGHT AT  HOME IS UNDER 165 POUNDS  30 tablet  11  . lactulose (CHRONULAC) 10 GM/15ML solution TAKE 15 TO (1 TO 2 TABLESPOONFULS) TWICE A DAY AS NEEDED FOR CONSTIPATION  946 mL  1  . methylphenidate (RITALIN) 5 MG tablet Take 1 tablet (5 mg total) by mouth 2 (two) times daily.  60 tablet  0  . metoprolol tartrate (LOPRESSOR) 25 MG tablet TAKE ONE TABLET TWICE A DAY  60 tablet  11  . potassium chloride SA (K-DUR,KLOR-CON) 20 MEQ tablet Take 1 tablet (20 mEq total) by mouth daily.  30 tablet  3  . warfarin (COUMADIN) 2 MG tablet Take as directed by anticoagulation clinic  30 tablet  3  . ALPRAZolam (XANAX) 0.25 MG tablet Take 0.5-1 tablets (0.125-0.25 mg total) by mouth 2 (two) times daily as needed for anxiety.  20 tablet  0  . COLCRYS 0.6 MG tablet TAKE 1 TABLET TWICE A DAY AS NEEDED      FOR GOUT PAIN  60 tablet  11  . fluticasone (FLONASE) 50 MCG/ACT nasal spray Place 2 sprays into the nose daily. In each nostril  16 g  12  . metolazone (ZAROXOLYN) 2.5 MG tablet Take 2.5 mg by mouth daily as needed.       No current facility-administered medications on file prior to visit.    Allergies  Allergen Reactions  . Gabapentin Other (See Comments)    Felt terrible, tremors, etc  . Meperidine Hcl Nausea And Vomiting  . Sulfamethoxazole Swelling    REACTION: unspecified  . Tramadol Hcl Nausea Only    Past Medical History  Diagnosis Date  . Atrial fibrillation     Status post AV junction ablation  . Diastolic heart failure     mild volume overload and increased symptoms of shortness of breath  . DJD (degenerative joint disease) of lumbar spine   . Pacemaker [employer]     Original implant [id number], gen  replacement [id number]  . HTN (hypertension)   . History of recurrent UTIs     Dr. Artis Flock   . Sleep disturbance   . Neuropathy   . Gout   . Depression   . Bronchitis   . Complete heart block 07/11/2011    Past  Surgical History  Procedure Laterality Date  . Ventricular ablation surgery      atrioventricular nodal ablation  . Knee surgery      right  . Appendectomy    . Breast biopsy    . Pulse generator implant      placement of internal pulse generator for permanent spinal cord- stimulartor left hip  . Ventricular cardiac pacemaker insertion      [employer]. Jude Victory  Pacemaker programmed to ventricular demand pacing  . Insert / replace / remove pacemaker      generator change out 09/29/11    Family History  Problem Relation Age of Onset  . Heart disease Mother     heart failure  . Stroke Father     History   Social History  . Marital Status: Married    Spouse Name: N/A    Number of Children: 2  . Years of Education: N/A   Occupational History  . homemaker    Social History Main Topics  . Smoking status: Former Smoker -- 1.00 packs/day    Types:  Cigarettes  . Smokeless tobacco: Never Used     Comment: quit long ago   . Alcohol Use: 0.5 oz/week    1 drink(s) per week     Comment: occ-wine  . Drug Use: No  . Sexual Activity: Not on file   Other Topics Concern  . Not on file   Social History Narrative   Son Rosanne Ashing holds health care power of attorney. Does request DNR. No tube feeds in cognitively unaware   Review of Systems Weight is down 3# Usually doesn't have nocturia Has occasional bouts of DOE mostly---not really worse    Objective:   Physical Exam  Constitutional: She appears well-developed and well-nourished. No distress.  Neck: Normal range of motion. Neck supple.  Cardiovascular: Normal rate, regular rhythm and normal heart sounds.  Exam reveals no gallop.   No murmur heard. Pulmonary/Chest: Effort normal and breath sounds normal. No respiratory distress. She has no wheezes. She has no rales.  Musculoskeletal: She exhibits edema.  Trace of edema in legs now  Lymphadenopathy:    She has no cervical adenopathy.  Psychiatric:  Mood is better Appropriate affect          Assessment & Plan:

## 2013-08-10 NOTE — Progress Notes (Signed)
Pre-visit discussion using our clinic review tool. No additional management support is needed unless otherwise documented below in the visit note.  

## 2013-08-10 NOTE — Assessment & Plan Note (Signed)
Better Still not in remission Discussed that she needs to continue the meds for the indefinite future

## 2013-08-11 ENCOUNTER — Ambulatory Visit (INDEPENDENT_AMBULATORY_CARE_PROVIDER_SITE_OTHER): Payer: Medicare Other | Admitting: Psychology

## 2013-08-11 DIAGNOSIS — F329 Major depressive disorder, single episode, unspecified: Secondary | ICD-10-CM

## 2013-08-15 ENCOUNTER — Other Ambulatory Visit: Payer: Self-pay | Admitting: Internal Medicine

## 2013-08-22 ENCOUNTER — Other Ambulatory Visit: Payer: Self-pay

## 2013-08-22 NOTE — Telephone Encounter (Signed)
Pt request rx methylphenidate. Call when ready for pick up. Pt has transportation to office on 08/23/13 before 3pm. Pt also request clonazepam refill called to [facility].

## 2013-08-23 ENCOUNTER — Ambulatory Visit (INDEPENDENT_AMBULATORY_CARE_PROVIDER_SITE_OTHER): Payer: Medicare Other | Admitting: Cardiovascular Disease

## 2013-08-23 ENCOUNTER — Encounter: Payer: Self-pay | Admitting: Cardiovascular Disease

## 2013-08-23 VITALS — BP 100/70 | HR 70 | Ht 64.0 in | Wt 158.8 lb

## 2013-08-23 DIAGNOSIS — I5032 Chronic diastolic (congestive) heart failure: Secondary | ICD-10-CM

## 2013-08-23 DIAGNOSIS — I2789 Other specified pulmonary heart diseases: Secondary | ICD-10-CM

## 2013-08-23 DIAGNOSIS — F329 Major depressive disorder, single episode, unspecified: Secondary | ICD-10-CM

## 2013-08-23 DIAGNOSIS — I272 Pulmonary hypertension, unspecified: Secondary | ICD-10-CM

## 2013-08-23 DIAGNOSIS — I10 Essential (primary) hypertension: Secondary | ICD-10-CM

## 2013-08-23 DIAGNOSIS — I4891 Unspecified atrial fibrillation: Secondary | ICD-10-CM

## 2013-08-23 DIAGNOSIS — R0602 Shortness of breath: Secondary | ICD-10-CM

## 2013-08-23 DIAGNOSIS — Z950 Presence of cardiac pacemaker: Secondary | ICD-10-CM

## 2013-08-23 MED ORDER — METHYLPHENIDATE HCL 5 MG PO TABS: 5.0000 mg | ORAL_TABLET | Freq: Two times a day (BID) | ORAL | Status: DC

## 2013-08-23 MED ORDER — CLONAZEPAM 0.5 MG PO TABS: 0.5000 mg | ORAL_TABLET | Freq: Every day | ORAL | Status: DC

## 2013-08-23 NOTE — Progress Notes (Signed)
Patient ID: Carrie [name], female    DOB: 09/22/1919, 79 y.o.   MRN: [id number]  HPI Comments: 79 year old white female patient with a h/o diastolic heart failure,  pneumonia 01/2010, severe pulmonary hypertension on echo, chronic lower extremity edema, long history of back problems requiring surgery,  chronic atrial fibrillation with AV node ablation, placement of pacemaker, depression, who presents for followup.   In followup today, she reports that she feels better. She feels that she came through a very depressed. And now is eating more, feeling better. Feeling less depressed. She is taking her diuretic/Lasix daily. Takes Zaroxolyn very occasionally. She reports that she does drink a lot of fluids. She has been noticing more palpitations recently and wonders if her pacemaker is working appropriately. During these pacing episodes, she feels different. Weight relatively stable at 168 pounds.   Lab work stable with normal renal function Pacemaker change out  by Dr. Graciela Husbands with no problems.   EKG showing ventricularly paced rhythm, rate 70 beats per minute, non-paced wide complexes noted    Outpatient Encounter Prescriptions as of 08/23/2013  Medication Sig  . allopurinol (ZYLOPRIM) 100 MG tablet TAKE ONE OR TWO TABLETS DAILY  . ALPRAZolam (XANAX) 0.25 MG tablet Take 0.5-1 tablets (0.125-0.25 mg total) by mouth 2 (two) times daily as needed for anxiety.  . cholecalciferol (VITAMIN D) 1000 UNITS tablet Take 1,000 Units by mouth daily.    . citalopram (CELEXA) 20 MG tablet Take 1 tablet (20 mg total) by mouth daily.  . clonazePAM (KLONOPIN) 0.5 MG tablet Take 1-2 tablets (0.5-1 mg total) by mouth at bedtime.  Marland Kitchen COLCRYS 0.6 MG tablet TAKE 1 TABLET TWICE A DAY AS NEEDED     FOR GOUT PAIN  . fluticasone (FLONASE) 50 MCG/ACT nasal spray Place 2 sprays into the nose daily. In each nostril  . furosemide (LASIX) 80 MG tablet TAKE ONE TABLET EVERY MORNING HOLD IF YOUR WEIGHT AT  HOME IS UNDER 165  POUNDS  . lactulose (CHRONULAC) 10 GM/15ML solution TAKE 15 TO (1 TO 2 TABLESPOONFULS) TWICE A DAY AS NEEDED FOR CONSTIPATION  . methylphenidate (RITALIN) 5 MG tablet Take 1 tablet (5 mg total) by mouth 2 (two) times daily.  . metolazone (ZAROXOLYN) 2.5 MG tablet Take 2.5 mg by mouth daily as needed.  . metoprolol tartrate (LOPRESSOR) 25 MG tablet TAKE ONE TABLET TWICE A DAY  . potassium chloride SA (K-DUR,KLOR-CON) 20 MEQ tablet Take 1 tablet (20 mEq total) by mouth daily.  Marland Kitchen warfarin (COUMADIN) 2 MG tablet Take as directed by anticoagulation clinic    Review of Systems  Constitutional: Positive for fatigue.  HENT: Negative.   Eyes: Negative.   Respiratory: Negative.   Cardiovascular: Positive for leg swelling.  Gastrointestinal: Negative.   Endocrine: Negative.   Musculoskeletal: Positive for arthralgias, back pain and joint swelling.  Skin: Negative.   Allergic/Immunologic: Negative.   Neurological: Positive for weakness.  Hematological: Negative.   Psychiatric/Behavioral: Negative.   All other systems reviewed and are negative.    BP 100/70  Pulse 70  Ht 5\' 4"  (1.626 m)  Wt 158 lb 12 oz (72.009 kg)  BMI 27.24 kg/m2  Physical Exam  Nursing note and vitals reviewed. Constitutional: She is oriented to person, place, and time. She appears well-developed and well-nourished.  HENT:  Head: Normocephalic.  Nose: Nose normal.  Mouth/Throat: Oropharynx is clear and moist.  Eyes: Conjunctivae are normal. Pupils are equal, round, and reactive to light.  Neck: Normal range of motion.  Neck supple. No JVD present.  Cardiovascular: Normal rate, regular rhythm, S1 normal, S2 normal and intact distal pulses.  Exam reveals no gallop and no friction rub.   Murmur heard.  Crescendo systolic murmur is present with a grade of 2/6  Pulmonary/Chest: Effort normal and breath sounds normal. No respiratory distress. She has no wheezes. She has no rales. She exhibits no tenderness.   Abdominal: Soft. Bowel sounds are normal. She exhibits no distension. There is no tenderness.  Musculoskeletal: Normal range of motion. She exhibits no edema and no tenderness.  Lymphadenopathy:    She has no cervical adenopathy.  Neurological: She is alert and oriented to person, place, and time. Coordination normal.  Skin: Skin is warm and dry. No rash noted. No erythema.  Psychiatric: She has a normal mood and affect. Her behavior is normal. Judgment and thought content normal.    Assessment and Plan

## 2013-08-23 NOTE — Assessment & Plan Note (Signed)
Appears relatively stable on her current diuretic regimen. She takes Lasix daily. She questioned whether it was okay to take Zaroxolyn when necessary, sparingly. We have suggested that this would probably be fine if she takes this for worsening edema or shortness of breath

## 2013-08-23 NOTE — Assessment & Plan Note (Signed)
She feels that she is slowly doing better from her recent depression. She's eating more, feeling somewhat happier

## 2013-08-23 NOTE — Assessment & Plan Note (Signed)
Blood pressure is well controlled on today's visit. No changes made to the medications. 

## 2013-08-23 NOTE — Assessment & Plan Note (Addendum)
She does report palpitations and episodes that are concerning to her. We will have her meet with Gunnar Fusi when she is available to evaluate her  device

## 2013-08-23 NOTE — Assessment & Plan Note (Signed)
Appears to be doing very well today. Mild edema with high normal renal function. Suggested it okay to take Zaroxolyn sparingly when necessary for worsening edema or shortness of breath.

## 2013-08-23 NOTE — Patient Instructions (Signed)
You are doing well. No medication changes were made.  OK to take zaroxolyn once a week as needed for leg edema  We will schedule a visit with paula to evaluate your pacer for extra beats, palpiattions  Please call us if you have new issues that need to be addressed before your next appt.  Your physician wants you to follow-up in: 6 months.  You will receive a reminder letter in the mail two months in advance. If you don't receive a letter, please call our office to schedule the follow-up appointment.

## 2013-08-23 NOTE — Telephone Encounter (Signed)
Spoke with patient and advised rx ready for pick-up and it will be at the front desk. rx sent to pharmacy by e-script

## 2013-08-23 NOTE — Telephone Encounter (Signed)
Phone in the clonazepam Note new sig

## 2013-09-03 ENCOUNTER — Other Ambulatory Visit: Payer: Self-pay | Admitting: Cardiovascular Disease

## 2013-09-07 ENCOUNTER — Ambulatory Visit (INDEPENDENT_AMBULATORY_CARE_PROVIDER_SITE_OTHER): Payer: Medicare Other

## 2013-09-07 DIAGNOSIS — Z7901 Long term (current) use of anticoagulants: Secondary | ICD-10-CM

## 2013-09-07 DIAGNOSIS — I4891 Unspecified atrial fibrillation: Secondary | ICD-10-CM

## 2013-09-07 LAB — POCT INR: INR: 2.5

## 2013-09-12 ENCOUNTER — Ambulatory Visit (INDEPENDENT_AMBULATORY_CARE_PROVIDER_SITE_OTHER): Payer: Medicare Other | Admitting: *Deleted

## 2013-09-12 DIAGNOSIS — I4891 Unspecified atrial fibrillation: Secondary | ICD-10-CM

## 2013-09-12 DIAGNOSIS — I442 Atrioventricular block, complete: Secondary | ICD-10-CM

## 2013-09-12 LAB — MDC_IDC_ENUM_SESS_TYPE_INCLINIC
Implantable Pulse Generator Model: 1110
MDC IDC PG SERIAL: 7280550

## 2013-09-12 NOTE — Progress Notes (Signed)
PPM check in office. 

## 2013-09-21 ENCOUNTER — Other Ambulatory Visit: Payer: Self-pay | Admitting: Family Medicine

## 2013-09-21 ENCOUNTER — Other Ambulatory Visit: Payer: Self-pay | Admitting: *Deleted

## 2013-09-21 MED ORDER — METHYLPHENIDATE HCL 5 MG PO TABS: 5.0000 mg | ORAL_TABLET | Freq: Two times a day (BID) | ORAL | Status: DC

## 2013-09-21 MED ORDER — CLONAZEPAM 0.5 MG PO TABS: 0.5000 mg | ORAL_TABLET | Freq: Every day | ORAL | Status: DC

## 2013-09-21 NOTE — Telephone Encounter (Signed)
Last filled 08/23/13 

## 2013-09-21 NOTE — Telephone Encounter (Signed)
rx called into pharmacy

## 2013-09-21 NOTE — Telephone Encounter (Signed)
Spoke with patient and advised rx ready for pick-up and it will be at the front desk.  

## 2013-09-21 NOTE — Telephone Encounter (Signed)
Pt requesting Ritalin RX.  Will be able to pick up in office tomorrow.

## 2013-09-21 NOTE — Telephone Encounter (Signed)
Okay #60 x 0 

## 2013-10-11 ENCOUNTER — Encounter: Payer: Self-pay | Admitting: Internal Medicine

## 2013-10-12 ENCOUNTER — Ambulatory Visit (INDEPENDENT_AMBULATORY_CARE_PROVIDER_SITE_OTHER): Payer: Medicare Other | Admitting: *Deleted

## 2013-10-12 DIAGNOSIS — I4891 Unspecified atrial fibrillation: Secondary | ICD-10-CM

## 2013-10-12 DIAGNOSIS — Z7901 Long term (current) use of anticoagulants: Secondary | ICD-10-CM

## 2013-10-12 DIAGNOSIS — Z5181 Encounter for therapeutic drug level monitoring: Secondary | ICD-10-CM

## 2013-10-12 LAB — POCT INR: INR: 2.8

## 2013-10-18 ENCOUNTER — Ambulatory Visit: Payer: Medicare Other | Admitting: Internal Medicine

## 2013-10-19 ENCOUNTER — Other Ambulatory Visit: Payer: Self-pay | Admitting: Family Medicine

## 2013-10-19 NOTE — Telephone Encounter (Addendum)
Both medications last filled 09/21/13.  Note on request says that pt would like to pick up RX Thursday morning when she has a caregiver that can help her.

## 2013-10-20 MED ORDER — METHYLPHENIDATE HCL 5 MG PO TABS: 5.0000 mg | ORAL_TABLET | Freq: Two times a day (BID) | ORAL | Status: DC

## 2013-10-20 MED ORDER — CLONAZEPAM 0.5 MG PO TABS: 0.5000 mg | ORAL_TABLET | Freq: Every day | ORAL | Status: DC

## 2013-10-20 NOTE — Telephone Encounter (Signed)
Okay to call in the clonazepam 

## 2013-10-20 NOTE — Telephone Encounter (Signed)
Spoke with patient and advised rx ready for pick-up and it will be at the front desk. rx called into pharmacy   

## 2013-11-01 ENCOUNTER — Encounter: Payer: Medicare Other | Admitting: Internal Medicine

## 2013-11-02 ENCOUNTER — Ambulatory Visit (INDEPENDENT_AMBULATORY_CARE_PROVIDER_SITE_OTHER): Payer: Medicare Other | Admitting: Internal Medicine

## 2013-11-02 ENCOUNTER — Encounter: Payer: Self-pay | Admitting: Internal Medicine

## 2013-11-02 VITALS — BP 112/60 | HR 72 | Temp 98.3°F | Wt 150.0 lb

## 2013-11-02 DIAGNOSIS — F329 Major depressive disorder, single episode, unspecified: Secondary | ICD-10-CM

## 2013-11-02 DIAGNOSIS — I4891 Unspecified atrial fibrillation: Secondary | ICD-10-CM

## 2013-11-02 DIAGNOSIS — I5032 Chronic diastolic (congestive) heart failure: Secondary | ICD-10-CM

## 2013-11-02 DIAGNOSIS — I872 Venous insufficiency (chronic) (peripheral): Secondary | ICD-10-CM

## 2013-11-02 MED ORDER — METHYLPHENIDATE HCL 5 MG PO TABS: 5.0000 mg | ORAL_TABLET | Freq: Two times a day (BID) | ORAL | Status: AC

## 2013-11-02 NOTE — Assessment & Plan Note (Signed)
No ulcers now

## 2013-11-02 NOTE — Progress Notes (Signed)
Pre visit review using our clinic review tool, if applicable. No additional management support is needed unless otherwise documented below in the visit note. 

## 2013-11-02 NOTE — Assessment & Plan Note (Signed)
Irregular now Will review her pacer with Dr Graciela HusbandsKlein next month

## 2013-11-02 NOTE — Assessment & Plan Note (Signed)
Compensated Weight is down some Rarely needs the zaroxlyn

## 2013-11-02 NOTE — Assessment & Plan Note (Signed)
Finally in remission Will reconsider the meds in 6-9 months (would consider stopping the ritalin first)

## 2013-11-02 NOTE — Progress Notes (Signed)
Subjective:    Patient ID: Carrie [name], female    DOB: 06/07/1920, 80 y.o.   MRN: [id number]  HPI Here with daughter Doing okay Much better with mood and close to her normal Out socially again Back to playing bridge, being with other people, etc Has once a week aide---takes her out shopping, etc  Weight is down a few pounds Legs still big-- but certainly no worse Used zaroxlyn about 4 days ago---but not very often (had leakage in her leg then) Got it wrapped for 2 days  Breathing is better Feels heart irregular at times No chest pain No dizziness or syncope  Current Outpatient Prescriptions on File Prior to Visit  Medication Sig Dispense Refill  . allopurinol (ZYLOPRIM) 100 MG tablet TAKE ONE OR TWO TABLETS DAILY  60 tablet  11  . ALPRAZolam (XANAX) 0.25 MG tablet Take 0.5-1 tablets (0.125-0.25 mg total) by mouth 2 (two) times daily as needed for anxiety.  20 tablet  0  . cholecalciferol (VITAMIN D) 1000 UNITS tablet Take 1,000 Units by mouth daily.        . citalopram (CELEXA) 20 MG tablet Take 1 tablet (20 mg total) by mouth daily.  30 tablet  11  . clonazePAM (KLONOPIN) 0.5 MG tablet Take 1-2 tablets (0.5-1 mg total) by mouth at bedtime.  60 tablet  0  . COLCRYS 0.6 MG tablet TAKE 1 TABLET TWICE A DAY AS NEEDED     FOR GOUT PAIN  60 tablet  11  . furosemide (LASIX) 80 MG tablet TAKE ONE TABLET EVERY MORNING HOLD IF YOUR WEIGHT AT  HOME IS UNDER 165 POUNDS  30 tablet  11  . KLOR-CON M20 20 MEQ tablet TAKE 1 TABLET EVERY DAY  30 tablet  6  . lactulose (CHRONULAC) 10 GM/15ML solution TAKE 15 TO 30ML (1 TO 2 TABLESPOONFULS) TWICE A DAY AS NEEDED FOR CONSTIPATION  946 mL  1  . methylphenidate (RITALIN) 5 MG tablet Take 1 tablet (5 mg total) by mouth 2 (two) times daily.  60 tablet  0  . metolazone (ZAROXOLYN) 2.5 MG tablet Take 2.5 mg by mouth daily as needed.      . metoprolol tartrate (LOPRESSOR) 25 MG tablet TAKE ONE TABLET TWICE A DAY  60 tablet  11  . warfarin (COUMADIN)  2 MG tablet Take as directed by anticoagulation clinic  30 tablet  3   No current facility-administered medications on file prior to visit.    Allergies  Allergen Reactions  . Gabapentin Other (See Comments)    Felt terrible, tremors, etc  . Meperidine Hcl Nausea And Vomiting  . Sulfamethoxazole Swelling    REACTION: unspecified  . Tramadol Hcl Nausea Only    Past Medical History  Diagnosis Date  . Atrial fibrillation     Status post AV junction ablation  . Diastolic heart failure     mild volume overload and increased symptoms of shortness of breath  . DJD (degenerative joint disease) of lumbar spine   . Pacemaker [employer]     Original implant [id number], gen  replacement [id number],2013  . HTN (hypertension)   . History of recurrent UTIs     Dr. Artis FlockWolfe   . Sleep disturbance   . Neuropathy   . Gout   . Depression   . Bronchitis   . Complete heart block 07/11/2011  . Depression     Past Surgical History  Procedure Laterality Date  . Ventricular ablation surgery  atrioventricular nodal ablation  . Knee surgery      right  . Appendectomy    . Breast biopsy    . Pulse generator implant      placement of internal pulse generator for permanent spinal cord- stimulartor left hip  . Ventricular cardiac pacemaker insertion      St. Jude Victory  Pacemaker programmed to ventricular demand pacing  . Insert / replace / remove pacemaker      generator change out 09/29/11    Family History  Problem Relation Age of Onset  . Heart disease Mother     heart failure  . Stroke Father     History   Social History  . Marital Status: Married    Spouse Name: N/A    Number of Children: 2  . Years of Education: N/A   Occupational History  . homemaker    Social History Main Topics  . Smoking status: Former Smoker -- 1.00 packs/day    Types: Cigarettes  . Smokeless tobacco: Never Used     Comment: quit long ago   . Alcohol Use: 0.5 oz/week    1 drink(s) per week     Comment:  occ-wine  . Drug Use: No  . Sexual Activity: Not on file   Other Topics Concern  . Not on file   Social History Narrative   Son Rosanne Ashing holds health care power of attorney. Does request DNR. No tube feeds in cognitively unaware   Review of Systems Appetite is good Sleeping reasonably well- trying to limit clonazepam to 1 at night---but does better with the 2 at night Bowels are slow--has to take lactulose     Objective:   Physical Exam  Constitutional: She appears well-developed and well-nourished. No distress.  Neck: Normal range of motion. Neck supple. No thyromegaly present.  Cardiovascular: Normal rate and normal heart sounds.  Exam reveals no gallop.   No murmur heard. irregular  Pulmonary/Chest: Effort normal and breath sounds normal. No respiratory distress. She has no wheezes. She has no rales.  Musculoskeletal:  Thick calves without pitting  Lymphadenopathy:    She has no cervical adenopathy.  Skin:  Chronic venous stasis changes in calves--no ulcers  Psychiatric: She has a normal mood and affect. Her behavior is normal.          Assessment & Plan:

## 2013-11-08 ENCOUNTER — Other Ambulatory Visit: Payer: Self-pay | Admitting: Internal Medicine

## 2013-11-14 ENCOUNTER — Other Ambulatory Visit: Payer: Self-pay | Admitting: *Deleted

## 2013-11-14 ENCOUNTER — Other Ambulatory Visit: Payer: Self-pay | Admitting: Internal Medicine

## 2013-11-14 NOTE — Telephone Encounter (Signed)
Okay #60 x 0 

## 2013-11-14 NOTE — Telephone Encounter (Signed)
Received faxed refill request from pharmacy. Last refill 10/20/13 #60, last office visit 11/02/13. Is it okay to refill medication?

## 2013-11-16 MED ORDER — CLONAZEPAM 0.5 MG PO TABS: 0.5000 mg | ORAL_TABLET | Freq: Every day | ORAL | Status: AC

## 2013-11-16 NOTE — Telephone Encounter (Signed)
Okay #60 x 0 

## 2013-11-21 NOTE — Telephone Encounter (Signed)
Pt called to ck on clonazepam refill; spoke with Annice PihJackie at [facility] and med already called in.pt notified.

## 2013-11-22 ENCOUNTER — Ambulatory Visit (INDEPENDENT_AMBULATORY_CARE_PROVIDER_SITE_OTHER): Payer: Medicare Other

## 2013-11-22 ENCOUNTER — Encounter: Payer: Self-pay | Admitting: Internal Medicine

## 2013-11-22 ENCOUNTER — Ambulatory Visit (INDEPENDENT_AMBULATORY_CARE_PROVIDER_SITE_OTHER): Payer: Medicare Other | Admitting: Internal Medicine

## 2013-11-22 VITALS — BP 123/82 | HR 70 | Ht 64.0 in | Wt 147.8 lb

## 2013-11-22 DIAGNOSIS — I4891 Unspecified atrial fibrillation: Secondary | ICD-10-CM

## 2013-11-22 DIAGNOSIS — Z5181 Encounter for therapeutic drug level monitoring: Secondary | ICD-10-CM

## 2013-11-22 DIAGNOSIS — I442 Atrioventricular block, complete: Secondary | ICD-10-CM

## 2013-11-22 DIAGNOSIS — Z7901 Long term (current) use of anticoagulants: Secondary | ICD-10-CM

## 2013-11-22 DIAGNOSIS — I5032 Chronic diastolic (congestive) heart failure: Secondary | ICD-10-CM

## 2013-11-22 LAB — POCT INR: INR: 1.9

## 2013-11-22 LAB — MDC_IDC_ENUM_SESS_TYPE_INCLINIC
Date Time Interrogation Session: 20150317110343
Implantable Pulse Generator Serial Number: 7280550
Lead Channel Impedance Value: 662.5 Ohm
Lead Channel Pacing Threshold Amplitude: 1.25 V
Lead Channel Pacing Threshold Pulse Width: 0.8 ms
Lead Channel Sensing Intrinsic Amplitude: 12 mV
Lead Channel Setting Pacing Pulse Width: 0.8 ms
Lead Channel Setting Sensing Sensitivity: 4 mV
MDC IDC MSMT BATTERY REMAINING LONGEVITY: 96 mo
MDC IDC MSMT BATTERY VOLTAGE: 2.96 V
MDC IDC SET LEADCHNL RV PACING AMPLITUDE: 2.5 V
MDC IDC STAT BRADY RV PERCENT PACED: 95 %

## 2013-11-22 NOTE — Progress Notes (Signed)
Patient Care Team: Karie Schwalbe, MD as PCP - General   HPI  Carrie [name] is a 80 y.o. female Seen in follow up for afib, chronic, S/p AV ablation and pacing. She underwent device generator replacement 11/13.  She comes in today with multiple complaints. She was started on Ritalin because of depression. I suspect she also has some degree of psychomotor retardation. Since that time she did complain of palpitations. She has not had this problem prior to that. She recollects that it is similar to her atrial fibrillation.  She also notes a midsternal chest discomfort. It can last up to 2-3 hours. It is unassociated with a brackish taste. It is not aggravated by exertion. She thinks is related to her palpitations, and disconnected in time with the initiation of her Ritalin.   Past Medical History  Diagnosis Date  . Atrial fibrillation     Status post AV junction ablation  . Diastolic heart failure     mild volume overload and increased symptoms of shortness of breath  . DJD (degenerative joint disease) of lumbar spine   . Pacemaker [employer]es     Original implant [id number], gen  replacement [id number]  . HTN (hypertension)   . History of recurrent UTIs     Dr. Artis Flock   . Sleep disturbance   . Neuropathy   . Gout   . Depression   . Bronchitis   . Complete heart block 07/11/2011  . Depression     Past Surgical History  Procedure Laterality Date  . Ventricular ablation surgery      atrioventricular nodal ablation  . Knee surgery      right  . Appendectomy    . Breast biopsy    . Pulse generator implant      placement of internal pulse generator for permanent spinal cord- stimulartor left hip  . Ventricular cardiac pacemaker insertion      St. Jude Victory  Pacemaker programmed to ventricular demand pacing  . Insert / replace / remove pacemaker      generator change out 09/29/11    Current Outpatient Prescriptions  Medication Sig Dispense Refill  . allopurinol  (ZYLOPRIM) 100 MG tablet TAKE ONE OR TWO TABLETS DAILY  60 tablet  11  . cholecalciferol (VITAMIN D) 1000 UNITS tablet Take 1,000 Units by mouth daily.        . citalopram (CELEXA) 20 MG tablet Take 1 tablet (20 mg total) by mouth daily.  30 tablet  11  . clonazePAM (KLONOPIN) 0.5 MG tablet Take 1-2 tablets (0.5-1 mg total) by mouth at bedtime.  60 tablet  0  . COLCRYS 0.6 MG tablet TAKE 1 TABLET TWICE A DAY AS NEEDED     FOR GOUT PAIN  60 tablet  11  . furosemide (LASIX) 80 MG tablet TAKE ONE TABLET EVERY MORNING HOLD IF YOUR WEIGHT AT  HOME IS UNDER 165 POUNDS  30 tablet  11  . KLOR-CON M20 20 MEQ tablet TAKE 1 TABLET EVERY DAY  30 tablet  6  . lactulose (CHRONULAC) 10 GM/15ML solution TAKE 15 TO (1 TO 2 TABLESPOONFULS) TWICE A DAY AS NEEDED FOR CONSTIPATION  946 mL  1  . methylphenidate (RITALIN) 5 MG tablet Take 1 tablet (5 mg total) by mouth 2 (two) times daily.  60 tablet  0  . metolazone (ZAROXOLYN) 2.5 MG tablet Take 2.5 mg by mouth daily as needed.      . metoprolol tartrate (  LOPRESSOR) 25 MG tablet TAKE ONE TABLET TWICE A DAY  60 tablet  11  . warfarin (COUMADIN) 2 MG tablet Take as directed by anticoagulation clinic  30 tablet  3   No current facility-administered medications for this visit.    Allergies  Allergen Reactions  . Gabapentin Other (See Comments)    Felt terrible, tremors, etc  . Meperidine Hcl Nausea And Vomiting  . Sulfamethoxazole Swelling    REACTION: unspecified  . Tramadol Hcl Nausea Only    Review of Systems negative except from HPI and PMH  Physical Exam BP 123/82  Pulse 70  Ht 5\' 4"  (1.626 m)  Wt 147 lb 12 oz (67.019 kg)  BMI 25.35 kg/m2 Well developed and well nourished in no acute distress HENT normal E scleral and icterus clear Neck Supple JVP flat; carotids brisk and full Clear to ausculation  Regular rate and rhythm, no murmurs gallops or rub Soft with active bowel sounds No clubbing cyanosis  Edema Alert and oriented, grossly  normal motor and sensory function Skin Warm and Dry  ECG was unrevealing  Assessment and  Plan  Atrial fibrillation  Permanent  Complete heart block status post AV junction ablation  Chest pain-atypical  Palpitations temporally related to Ritalin  Depression  Pacemaker  The patient's device was interrogated.  The information was reviewed. No changes were made in the programming.    I think the patient's chest pain is noncardiac. I wonder whether it is related to GI triggers. However, in her mind many of her symptoms including her palpitations are temporally related to the initiation of Ritalin which I suspect was started for psychomotor retardation the context of depression. I'm going to take the liberty of stopping the to consider alternative therapies given the side effects of his current regime seems to be problematic

## 2013-11-22 NOTE — Patient Instructions (Signed)
Your physician has recommended you make the following change in your medication:  Take your Metolazone twice a week for 4 weeks  Take Ritalin until your prescription runs out then stop.    Your physician recommends that you have lab work today: BMP Magnesium   We are checking your INR today  Your physician wants you to follow-up in: 1 year with Dr. Graciela HusbandsKlein. You will receive a reminder letter in the mail two months in advance. If you don't receive a letter, please call our office to schedule the follow-up appointment.  Remote monitoring is used to monitor your Pacemaker of ICD from home. This monitoring reduces the number of office visits required to check your device to one time per year. It allows us to keep an eye on the functioning of your device to ensure it is working properly. You are scheduled for a device check from home on 02/23/14. You may send your transmission at any time that day. If you have a wireless device, the transmission will be sent automatically. After your physician reviews your transmission, you will receive a postcard with your next transmission date.

## 2013-11-23 LAB — BASIC METABOLIC PANEL
BUN / CREAT RATIO: 26 (ref 11–26)
BUN: 27 mg/dL (ref 10–36)
CHLORIDE: 90 mmol/L — AB (ref 97–108)
CO2: 29 mmol/L (ref 18–29)
Calcium: 10.1 mg/dL (ref 8.7–10.3)
Creatinine, Ser: 1.02 mg/dL — ABNORMAL HIGH (ref 0.57–1.00)
GFR calc Af Amer: 56 mL/min/{1.73_m2} — ABNORMAL LOW (ref >59)
GFR calc non Af Amer: 48 mL/min/{1.73_m2} — ABNORMAL LOW (ref >59)
Glucose: 114 mg/dL — ABNORMAL HIGH (ref 65–99)
POTASSIUM: 3.2 mmol/L — AB (ref 3.5–5.2)
SODIUM: 140 mmol/L (ref 134–144)

## 2013-11-23 LAB — MAGNESIUM: MAGNESIUM: 2 mg/dL (ref 1.6–2.6)

## 2013-12-07 DEATH — deceased

## 2014-03-01 ENCOUNTER — Ambulatory Visit: Payer: Medicare Other | Admitting: Internal Medicine

## 2014-08-17 ENCOUNTER — Encounter (HOSPITAL_COMMUNITY): Payer: Self-pay | Admitting: Internal Medicine

## 2014-12-31 NOTE — Consult Note (Signed)
Brief Consult Note: Diagnosis: hx of AFib with AV junction ablation;  s/p pm recently changed and normal LV function 2012 with mod MR.   Patient was seen by consultant.   Comments: Did not see request for consultation in the progress notes despite call, so came by ; reviewed the chart said hello  call if we can contribute anything but seems to be making progress steve klein.  Electronic Signatures: Duke SalviaKlein, Steven C (MD)  (Signed 14-Mar-13 17:58)  Authored: Brief Consult Note   Last Updated: 14-Mar-13 17:58 by Duke SalviaKlein, Steven C (MD)

## 2014-12-31 NOTE — Discharge Summary (Signed)
PATIENT NAME:  Carrie [name], Carrie [name] MR#:  [id number] DATE OF BIRTH:  [birth year]  DATE OF ADMISSION:  11/17/2011 DATE OF DISCHARGE:  11/22/2011  DISPOSITION: Anticipated discharge to [facility] on 11/22/2011.   CHIEF COMPLAINT: Cough, fever and shortness of breath.   DISCHARGE DIAGNOSES:  1. Systemic inflammatory response syndrome due to right lower lobe pneumonia.  2. Acute respiratory failure, improved.  3. Chronic atrial fibrillation, rate controlled on metoprolol. Patient on Coumadin.  4. Coagulopathy in the setting of patient being on Coumadin and antibiotics, resolved, improved.  5. Hypertension.  6. Acute renal failure.  7. History of congestive heart failure, diastolic in nature. Patient does not have heart failure during this hospitalization.  8. Generalized weakness.  9. External hemorrhoids.   CODE STATUS: FULL CODE.   MEDICATIONS:  1. Augmentin 400 mg p.o. b.i.d. suspension for six more days.  2. Zithromax 250 mg p.o. daily for one more day to complete a five-day course.  3. Coumadin 3 mg every 5:00 p.m. PT-INR to be checked 11/23/2011 and dosage adjusted to keep INR between 2 and 3.  4. Lasix 40 mg daily.  5. Lotensin 5 mg daily.  6. Hemorrhoidal HC suppository 25 mg rectal q.6 p.r.n.  7. TUCKS to affected area 1 to 2 p.r.n. for perianal discomfort.   8. Remeron 15 mg at bedtime.  9. Klonopin 4.5 mg at bedtime.  10. Tessalon Perles 100 mg q.4 hours for seven days.  11. Lactulose 30 mL p.o. daily p.r.n. for constipation.  12. Tylenol 650 mg p.o. every four hours p.r.n.  13. Zofran 4 mg every six hours p.r.n.  14. Mucinex 600 mg b.i.d. for seven days.  15. Allopurinol 100 mg daily.  16. Metoprolol XL 25 mg daily.  17. Physical therapy to see patient.  18. 2 grams sodium diet.  19. DuoNebs every four hours p.r.n.  20. Check PT/INR 11/23/2011 and adjust Coumadin accordingly.   BRIEF SUMMARY OF HOSPITAL COURSE: Ms. Carrie [name] is an 81 year old Caucasian female with past  medical history of atrial fibrillation status post pacemaker who failed outpatient treatment for bronchitis comes in with respiratory failure, debilitation and weakness. She was admitted with:  1. Systemic inflammatory response syndrome due to right lower lobe pneumonia most likely causing her acute respiratory failure. She failed outpatient treatment. Her blood cultures were negative. She was admitted, started IV Zosyn and IV Rocephin. Sputum cultures, patient not able to bring up phlegm. She was continued on Xopenex and Atrovent nebs as needed. She was also placed on expectorants and Mucinex as well. Patient's antibiotics were changed to Augmentin suspension and Zithromax. She remained afebrile. White count was stable.  2. Chronic atrial fibrillation, rate controlled with metoprolol. Patient's Coumadin was held given acquired coagulopathy in the setting of use of antibiotics. She received a small dose of vitamin K to reverse her INR which had gone up to 6.7. Her INR at discharge is 1.9. She will be on 3 mg p.o. Coumadin and dose will be adjusted according to INR by checking PT- INR, next one on 11/23/2011. Will defer it to nursing home director.  3. Hypertension. Resume benazepril at 5 mg. Patient used to take 20 mg at home, however, blood pressure was relatively stable hence 5 mg of benazepril was continued. Metoprolol was continued too.  4. Acute renal failure. Patient received some IV fluids on admission. Her home dose Lasix was decreased to 40 mg daily. She appeared clinically dry. We will hold off on metolazone.  5. History of  congestive heart failure, diastolic in nature. Echo showed ejection fraction of 50% with severe pulmonary hypertension and elevated RVSP. She appeared euvolemic after IV hydration. Patient was not in heart failure during this hospitalization.  6. Physical therapy evaluated patient. She was quite debilitated and is going to be discharged to [facility] for rehab before she heads  back to her independent living.   LABORATORY, DIAGNOSTIC AND RADIOLOGICAL DATA: PT-INR 22, INR 1.9. Echo Doppler: Left ventricular systolic function is low normal, ejection fraction 50% to 55%, septal motion consistent with conduction abnormalities. Left atrium is severely dilated. Right atrium is moderately dilated. There is mild to moderate mitral regurgitation. Right ventricular systolic pressure is elevated more than 60 mmHg. Elevated RVSP consistent with severe pulmonary hypertension. Glucose 188, BUN 32, creatinine 1.2, sodium 136, potassium 3.5, chloride 96, bicarbonate 32, calcium 8.6, white count 2.5, hemoglobin and hematocrit 12.6 and 38.6, platelet count 110. Urinalysis negative for urinary tract infection. Blood cultures negative in 38 hours. B-type natriuretic peptide 2226. Chest x-ray consistent with increased density in the right base compatible with atelectasis or early pneumonia. Stable cardiomegaly. Hospital stay otherwise remained stable. CODE STATUS: Patient remained a FULL CODE.   TIME SPENT: 40 minutes.   ____________________________ Wylie Hail Allena Katz, MD sap:cms D: 11/21/2011 12:01:00 ET T: 11/21/2011 12:56:39 ET JOB#: [id number]  cc: Mashelle Busick A. Allena Katz, MD, <Dictator> Karie Schwalbe, MD Willow Ora MD ELECTRONICALLY SIGNED 11/21/2011 15:32

## 2014-12-31 NOTE — H&P (Signed)
PATIENT NAME:  Carrie [name], BOUTWELL MR#:  [id number] DATE OF BIRTH:  04-Oct-1919  DATE OF ADMISSION:  11/17/2011  REFERRING PHYSICIAN: Dr. Darnelle Catalan    PRIMARY CARE PHYSICIAN: Dr. Alphonsus Sias    REASON FOR ADMISSION: Failed outpatient antibiotic therapy for bronchitis, acute renal failure, respiratory failure.   HISTORY OF PRESENT ILLNESS: This is an 81 year old white female with past medical history of hypertension, atrial fibrillation, congestive heart failure diastolic in nature, degenerative joint disease and lumbar stenosis who presents with worsening shortness of breath, sputum, fevers. The patient several days ago was started on Levaquin by primary care physician and has had no improvement. She developed nausea and diarrhea from the Levaquin. She has had increased cough, dyspnea on exertion, phlegm which has been green in color, nausea and vomiting which she believes is due to the antibiotic. When she came in she was sating 88% on room air. She has been dyspneic, received Zosyn, ceftriaxone, azithromycin, ceftriaxone, and nebulizers x2 weeks. She has had no improvement with her shortness of breath. We are asked to admit the patient for respiratory failure from pneumonia.   PAST MEDICAL HISTORY:  1. Atrial fibrillation. 2. Congestive heart failure, diastolic in nature.  3. Lumbar stenosis. 4. Degenerative joint disease. 5. Hypertension. 6. Anxiety.   PAST SURGICAL HISTORY:  1. Pacemaker placement. 2. Cataract. 3. Hysterectomy. 4. Appendectomy. 5. Kyphoplasties. 6. Spinal cord stimulator removal.   DRUG ALLERGIES: No known drug allergies.   MEDICATIONS AT HOME:  1. Allopurinol 100 mg 2 tablets daily. 2. Benazepril 20 mg daily.  3. Cholecalciferol 1000 international units daily.  4. Clonazepam 0.5 mg half-tablet 1 tablet once at bedtime if needed.  5. Colchicine 0.6 mg daily.  6. Lasix 80 mg daily.  7. Metolazone 2.5 mg as needed. 8. Metoprolol 25 mg daily. 9. Mirtazapine 15 mg daily.   10. Nitroglycerin 0.4 mg p.r.n. chest pain. 11. Polyethylene glycol one capful daily.  12. Warfarin 4 mg daily.   ALLERGIES: Sulfa.   FAMILY HISTORY: Mother died age 37 of congestive heart failure. Father died at age 48 of stroke. Sister died at age 69 of breast cancer. Brother died of pancreatic cancer.   SOCIAL HISTORY: She lives at [facility] independent living. She drinks wine at night. Does not smoke, drink, or use illicit drugs. Retired housewife. Widowed with two healthy adult children.   REVIEW OF SYSTEMS: CONSTITUTIONAL: She has been feeling feverish, subjectively weak. EYES: No blurred vision, double vision, inflammation, glaucoma. She does wear glasses. ENT: No tinnitus or ear pain. She does have some hearing loss. No seasonal allergies, epistaxis, or discharge. RESPIRATORY: She has cough, hemoptysis, dyspnea, some wheezing. CARDIOVASCULAR: No chest pain, orthopnea, edema, arrhythmia, dyspnea on exertion, or palpitations. GI: Nausea and vomiting which has now resolved. She has diarrhea. No abdominal pain, hematemesis, melena, gastroesophageal reflux disease, bright red blood per rectum. GU: No dysuria, hematuria, renal calculi, increased frequency, or incontinence. GYN: No breast mass, tenderness, or vaginal discharge. ENDOCRINE: No polyuria, nocturia, thyroid problems, increased sweating, heat or cold intolerance. HEME/LYMPH: No anemia, easy bruising, swollen glands. INTEGUMENTARY: No acne, rash, change in mole, hair or skin. MUSCULOSKELETAL: No pain in back, shoulder, knee, hip, arthritis, swelling, gout. NEUROLOGIC: No numbness, weakness, tremor, vertigo, ataxia. PSYCH: No anxiety, insomnia, ADD, bipolar, depression.   PHYSICAL EXAMINATION:   VITAL SIGNS: Temperature 98, heart rate 74, respiratory rate 18, blood pressure 133/74, sating now 95% on 2 liters.   GENERAL: The patient is well developed, well nourished in mild respiratory distress.  HEENT: Pupils equal and reactive to  light and accommodation. Anicteric sclerae. No difficulty hearing. Oropharynx clear.  NECK: No JVD. No lymphadenopathy. No carotid bruits.   LUNGS: Some scant wheezing and coarse breath sounds. Use of accessory muscles. Increased effort. Mild respiratory distress.   CARDIOVASCULAR: Regular rate and rhythm. Normal S1, S2. She currently is not in atrial fibrillation when I examined her. No lower extremity edema. 2+ dorsal pedal pulses bilaterally.   BREASTS: Deferred.   ABDOMEN: Soft, nontender, nondistended. Positive bowel sounds. No hernia. No organomegaly.   MUSCULOSKELETAL: Strength 5/5. No clubbing, cyanosis, or degenerative joint disease.   GU: Deferred.   SKIN: No rashes, lesions, induration. Warm to touch.   LYMPH: No lymphadenopathy in the cervical or supraclavicular area.   NEUROLOGIC: Cranial nerves II through XII are intact. No dysphagia, aphasia, or dysarthria.   PSYCH: Alert and oriented x3.   LABORATORY, DIAGNOSTIC, AND RADIOLOGICAL DATA: EKG shows electronic paced. Chest x-ray shows possible developing right lower lobe infiltrate versus atelectasis.  White count 3.8, hemoglobin 13, hematocrit 39.5, platelets 126, MCV 95, glucose 109, BUN 42, creatinine 1.43, sodium 136, potassium 3.6, chloride 96, bicarb 31, total bilirubin 0.5, alkaline phosphatase 85, AST 12, ALT 25, albumin 3.7. Troponin 0.03. BNP 2226.   ASSESSMENT AND PLAN: This is a pleasant 81 year old white female with past medical history of atrial fibrillation status post pacemaker placement who failed outpatient treatment for bronchitis who comes in with respiratory failure, debilitation, and weakness.  1. Pneumonia most likely causing acute respiratory failure, failed outpatient treatment. Will get blood cultures x2. Sputum culture. Xopenex and Atrovent nebulizers, Solu-Medrol, and Zosyn. She lives at [facility]. Will hold off on vancomycin for now. If she does not improve, then will consider this. In addition,  will give mucolytics.  2. Atrial fibrillation, rate controlled. Continue metoprolol and Coumadin. Check INR.  3. Hypertension. Will hold ACE inhibitor and continue metoprolol.  4. Acute renal failure. Will hold Metolazone and Lasix. Give gentle IV fluids.  5. History of congestive heart failure, diastolic in nature. Will hold Lasix and Metolazone as she seems to be dry, could be signs of systemic inflammatory response syndrome.  6. DVT prophylaxis. Maintain with Coumadin.  7. Debilitation. The patient will be evaluated by PT.  8.   Thromobocytoepenia.  Will monitor.  CODE STATUS: FULL CODE.   Thank you for allowing me to participate in the care of this patient.   TOTAL TIME SPENT ON ADMISSION: 55 minutes.  ____________________________ Corie ChiquitoAmir A. [place] DragonFirozvi, MD aaf:drc D: 11/17/2011 15:40:24 ET T: 11/17/2011 16:05:24 ET JOB#: [id number]  cc: Karolee OhsAmir A. [place] DragonFirozvi, MD, <Dictator> Karie Schwalbeichard I. Letvak, MD Antonieta Ibaimothy J. Gollan, MD Karolee OhsAMIR Laverda PageA Karishma Unrein MD ELECTRONICALLY SIGNED 11/17/2011 17:41

## 2014-12-31 NOTE — Discharge Summary (Signed)
PATIENT NAME:  Carrie [name], Carrie [name] MR#:  [id number] DATE OF BIRTH:  October 31, 1919  DATE OF ADMISSION:  11/17/2011 DATE OF DISCHARGE:  11/24/2011  ADMITTING PHYSICIAN: Dr. Larena GlassmanAmir Firozvi   DISCHARGING PHYSICIAN: Dr. Enid Baasadhika Gevork Ayyad  PRIMARY CARE PHYSICIAN: Dr. Tillman Abideichard Letvak PRIMARY CARDIOLOGIST: Dr. Julien Nordmannimothy Gollan   CONSULTATIONS IN THE HOSPITAL: Cardiology consultation by Dr. Sherryl MangesSteven Klein  DISCHARGE DIAGNOSES:  1. Systemic inflammatory response syndrome secondary to pneumonia.  2. Right lower lobe pneumonia.  3. Acute respiratory failure secondary to pneumonia which improved.  4. Chronic atrial fibrillation, rate controlled on anticoagulation with Coumadin.  5. Sick sinus syndrome and chronic atrial fibrillation status post junctional ablation in the past and also pacemaker placement.  6. Hypertension.  7. Acute renal failure, which has resolved.  8. History of congestive heart failure, diastolic dysfunction, ejection fraction is 50% to 55%.  9. External hemorrhoids.  10. Hypokalemia in the hospital which got repleted.  11. Gout. 12. Degenerative joint disease. 13. Lumbar spinal stenosis.  14. Acquired coagulopathy with elevated INR of 5.7 during the hospital course requiring vitamin K.   DISCHARGE HOME MEDICATIONS:  1. Cholecalciferol 1000 international units p.o. daily.  2. Clonazepam 0.25 mg p.o. at bedtime as needed.  3. Colchicine 0.6 mg p.o. daily.  4. Remeron 15 mg p.o. at bedtime.  5. Warfarin 4 mg p.o. daily.  6. Toprol 25 mg p.o. daily.  7. Lasix 40 mg p.o. daily.  8. Benazepril 5 mg p.o. daily.  9. Potassium chloride 20 mEq p.o. daily.  10. Allopurinol 100 mg p.o. daily.  11. Temazepam 15 mg p.o. at bedtime p.r.n. for insomnia.  12. Prednisone taper to be started at 50 mg daily and taper off x 5 mg every day.  13. Augmentin suspension 400 mg orally every 12 hours until 12/01/2011.  14. Tessalon Perles 100 mg p.o. q.6 hours p.r.n.  15. Combivent inhaler 2 puffs every six  hours p.r.n.  16. Hemorrhoidal HC suppository 25 mg rectal q.6 hours p.r.n. for external hemorrhoids.   DISCHARGE HOME OXYGEN: None.   DISCHARGE DIET: Low sodium diet.   DISCHARGE ACTIVITY: As tolerated.    FOLLOW UP INSTRUCTIONS:  1. Physical therapy.  2. Check PT/INR in two days and adjust Coumadin dose. Goal INR to be between 2 and 3.  3. Primary care physician follow up in 1 to 2 weeks.   LABORATORY, DIAGNOSTIC AND RADIOLOGICAL DATA: Sodium 138, potassium 3.2, chloride 99, bicarbonate 32, BUN 24, creatinine 0.91, glucose 147, calcium 8.7, INR 1.5.   WBC 6.9, hemoglobin 12.4, hematocrit 37.8, platelet count 168. Magnesium 1.7 which was replaced. Echo Doppler showing ejection fraction of 50% to 55%, low normal ejection fraction. Septal motion consistent with conduction abnormality. Left atrium severely dilated. Right atrium moderately dilated. Elevated right-sided pressures and pulmonary hypertension.    BNP on admission 2226. Chest x-ray showing minimal increased density in right base compatible with early pneumonia. No pulmonary edema is seen. Blood cultures remained negative.   BRIEF HOSPITAL COURSE: Please look at the history and physical dictated by Dr. Larena GlassmanAmir Firozvi on 11/17/2011 and also the discharge summary dictated by Dr. Enedina FinnerSona Patel on 11/22/2011. This is an addendum to the hospital course that happened from 11/23/2011 to 11/24/2011. Carrie [name] is an 81 year old elderly female with past medical history significant for hypertension, congestive heart failure with diastolic dysfunction, atrial fibrillation status post pacemaker on Coumadin who was admitted for:  1. Acute respiratory failure secondary to pneumonia and failed outpatient antibiotic therapy. She is from independent  living at [facility]. She also had reactive airway disease associated with pneumonia and bronchitis and was on IV Solu-Medrol along with IV antibiotics while in the hospital. Her antibiotics have been changed to  Augmentin at the time of discharge and she has finished five day course of azithromycin in the hospital. Her steroids are being tapered to oral prednisone and she is also on Tessalon Perles for her cough.  2. Chronic atrial fibrillation, status post ablation and pacemaker placement. She is on Toprol for rate control and also on Coumadin. Her INR was supratherapeutic in the hospital. She did receive vitamin K and is being discharged on 4 mg of Coumadin and INR to be checked in 2 to 3 days and Coumadin dose needs to be readjusted. She is being sent on Augmentin antibiotic and will not hopefully interact with Coumadin and INR levels.  3. Acute renal failure and hypokalemia while in the hospital. Improved with fluids. 4. Congestive heart failure with diastolic dysfunction, ejection fraction 50% to 55%, mostly elevated right-sided pressures and pulmonary hypertension. Lasix dose has been decreased to 40 mg while in the hospital and will be discharged on the same. She will be also on potassium supplements while on Lasix.  5. Hypertension. Continue Toprol and benazepril. 6. Course has been otherwise uneventful in the hospital.   DISCHARGE CONDITION: Stable.   DISCHARGE DISPOSITION: To skilled nursing facility at [facility].   CODE STATUS: FULL CODE.   ADDITIONAL TIME SPENT ON DISCHARGE: 35 minutes.   ____________________________ Enid Baas, MD rk:cms D: 11/24/2011 13:39:58 ET T: 11/24/2011 14:09:56 ET JOB#: [id number]  cc: Enid Baas, MD, <Dictator> Karie Schwalbe, MD Enid Baas MD ELECTRONICALLY SIGNED 11/26/2011 13:51

## 2015-03-29 ENCOUNTER — Encounter: Payer: Self-pay | Admitting: Internal Medicine
